# Patient Record
Sex: Female | Born: 1945 | Race: White | Hispanic: No | Smoking: Current every day smoker
Health system: Southern US, Community
[De-identification: ages and names within clinical notes are randomized; demographics above are authoritative.]

## PROBLEM LIST (undated history)

## (undated) DIAGNOSIS — J449 Chronic obstructive pulmonary disease, unspecified: Secondary | ICD-10-CM

## (undated) DIAGNOSIS — I517 Cardiomegaly: Secondary | ICD-10-CM

## (undated) DIAGNOSIS — I2510 Atherosclerotic heart disease of native coronary artery without angina pectoris: Secondary | ICD-10-CM

## (undated) DIAGNOSIS — Z22322 Carrier or suspected carrier of Methicillin resistant Staphylococcus aureus: Secondary | ICD-10-CM

## (undated) DIAGNOSIS — IMO0002 Reserved for concepts with insufficient information to code with codable children: Secondary | ICD-10-CM

## (undated) DIAGNOSIS — F1120 Opioid dependence, uncomplicated: Secondary | ICD-10-CM

## (undated) DIAGNOSIS — M1990 Unspecified osteoarthritis, unspecified site: Secondary | ICD-10-CM

## (undated) DIAGNOSIS — F32A Depression, unspecified: Secondary | ICD-10-CM

## (undated) DIAGNOSIS — F329 Major depressive disorder, single episode, unspecified: Secondary | ICD-10-CM

## (undated) DIAGNOSIS — R52 Pain, unspecified: Secondary | ICD-10-CM

## (undated) DIAGNOSIS — R569 Unspecified convulsions: Secondary | ICD-10-CM

## (undated) DIAGNOSIS — K5792 Diverticulitis of intestine, part unspecified, without perforation or abscess without bleeding: Secondary | ICD-10-CM

## (undated) DIAGNOSIS — E079 Disorder of thyroid, unspecified: Secondary | ICD-10-CM

## (undated) DIAGNOSIS — K219 Gastro-esophageal reflux disease without esophagitis: Secondary | ICD-10-CM

## (undated) DIAGNOSIS — I10 Essential (primary) hypertension: Secondary | ICD-10-CM

## (undated) DIAGNOSIS — M797 Fibromyalgia: Secondary | ICD-10-CM

## (undated) DIAGNOSIS — H269 Unspecified cataract: Secondary | ICD-10-CM

## (undated) DIAGNOSIS — G40909 Epilepsy, unspecified, not intractable, without status epilepticus: Secondary | ICD-10-CM

## (undated) DIAGNOSIS — M329 Systemic lupus erythematosus, unspecified: Secondary | ICD-10-CM

## (undated) DIAGNOSIS — E039 Hypothyroidism, unspecified: Secondary | ICD-10-CM

## (undated) DIAGNOSIS — H9190 Unspecified hearing loss, unspecified ear: Secondary | ICD-10-CM

## (undated) DIAGNOSIS — M539 Dorsopathy, unspecified: Secondary | ICD-10-CM

## (undated) DIAGNOSIS — K529 Noninfective gastroenteritis and colitis, unspecified: Secondary | ICD-10-CM

## (undated) HISTORY — PX: CORONARY ANGIOPLASTY: SHX604

## (undated) HISTORY — DX: Disorder of thyroid, unspecified: E07.9

## (undated) HISTORY — DX: Unspecified cataract: H26.9

## (undated) HISTORY — PX: CERVICAL SPINE SURGERY: SHX589

## (undated) HISTORY — DX: Opioid dependence, uncomplicated: F11.20

## (undated) HISTORY — PX: ABDOMINAL HYSTERECTOMY: SHX81

## (undated) HISTORY — DX: Essential (primary) hypertension: I10

## (undated) HISTORY — PX: BREAST SURGERY: SHX581

## (undated) HISTORY — DX: Chronic obstructive pulmonary disease, unspecified: J44.9

## (undated) HISTORY — DX: Unspecified osteoarthritis, unspecified site: M19.90

## (undated) HISTORY — DX: Diverticulitis of intestine, part unspecified, without perforation or abscess without bleeding: K57.92

## (undated) HISTORY — PX: EYE SURGERY: SHX253

## (undated) HISTORY — PX: OTHER SURGICAL HISTORY: SHX169

## (undated) HISTORY — PX: BACK SURGERY: SHX140

---

## 1998-05-22 ENCOUNTER — Ambulatory Visit (HOSPITAL_COMMUNITY): Admission: RE | Admit: 1998-05-22 | Discharge: 1998-05-22 | Payer: Self-pay | Admitting: Neurological Surgery

## 1998-05-22 ENCOUNTER — Encounter: Payer: Self-pay | Admitting: Neurological Surgery

## 1999-06-05 ENCOUNTER — Encounter: Payer: Self-pay | Admitting: Neurological Surgery

## 1999-06-05 ENCOUNTER — Ambulatory Visit (HOSPITAL_COMMUNITY): Admission: RE | Admit: 1999-06-05 | Discharge: 1999-06-05 | Payer: Self-pay | Admitting: Neurological Surgery

## 1999-06-23 ENCOUNTER — Encounter: Payer: Self-pay | Admitting: Neurological Surgery

## 1999-06-25 ENCOUNTER — Inpatient Hospital Stay (HOSPITAL_COMMUNITY): Admission: RE | Admit: 1999-06-25 | Discharge: 1999-06-26 | Payer: Self-pay | Admitting: Neurological Surgery

## 1999-06-25 ENCOUNTER — Encounter: Payer: Self-pay | Admitting: Neurological Surgery

## 1999-07-16 ENCOUNTER — Encounter: Admission: RE | Admit: 1999-07-16 | Discharge: 1999-07-16 | Payer: Self-pay | Admitting: Neurological Surgery

## 1999-07-16 ENCOUNTER — Encounter: Payer: Self-pay | Admitting: Neurological Surgery

## 1999-08-28 ENCOUNTER — Encounter: Payer: Self-pay | Admitting: Neurological Surgery

## 1999-08-28 ENCOUNTER — Encounter: Admission: RE | Admit: 1999-08-28 | Discharge: 1999-08-28 | Payer: Self-pay | Admitting: Neurological Surgery

## 1999-09-03 ENCOUNTER — Ambulatory Visit (HOSPITAL_COMMUNITY): Admission: RE | Admit: 1999-09-03 | Discharge: 1999-09-03 | Payer: Self-pay | Admitting: Neurosurgery

## 2000-01-27 ENCOUNTER — Ambulatory Visit (HOSPITAL_COMMUNITY): Admission: RE | Admit: 2000-01-27 | Discharge: 2000-01-27 | Payer: Self-pay | Admitting: Internal Medicine

## 2000-03-02 ENCOUNTER — Encounter: Payer: Self-pay | Admitting: Neurological Surgery

## 2000-03-02 ENCOUNTER — Encounter: Admission: RE | Admit: 2000-03-02 | Discharge: 2000-03-02 | Payer: Self-pay | Admitting: Neurological Surgery

## 2000-04-08 ENCOUNTER — Encounter: Payer: Self-pay | Admitting: Neurological Surgery

## 2000-04-08 ENCOUNTER — Ambulatory Visit (HOSPITAL_COMMUNITY): Admission: RE | Admit: 2000-04-08 | Discharge: 2000-04-08 | Payer: Self-pay | Admitting: Neurological Surgery

## 2000-08-19 ENCOUNTER — Encounter: Payer: Self-pay | Admitting: Neurological Surgery

## 2000-08-19 ENCOUNTER — Encounter: Admission: RE | Admit: 2000-08-19 | Discharge: 2000-08-19 | Payer: Self-pay | Admitting: Neurological Surgery

## 2001-09-13 ENCOUNTER — Ambulatory Visit (HOSPITAL_COMMUNITY): Admission: RE | Admit: 2001-09-13 | Discharge: 2001-09-13 | Payer: Self-pay | Admitting: Neurological Surgery

## 2001-09-13 ENCOUNTER — Encounter: Payer: Self-pay | Admitting: Neurological Surgery

## 2002-09-19 ENCOUNTER — Ambulatory Visit (HOSPITAL_COMMUNITY): Admission: RE | Admit: 2002-09-19 | Discharge: 2002-09-19 | Payer: Self-pay | Admitting: Neurological Surgery

## 2002-09-19 ENCOUNTER — Encounter: Payer: Self-pay | Admitting: Neurological Surgery

## 2003-04-19 ENCOUNTER — Encounter: Payer: Self-pay | Admitting: Neurology

## 2003-04-19 ENCOUNTER — Ambulatory Visit (HOSPITAL_COMMUNITY): Admission: RE | Admit: 2003-04-19 | Discharge: 2003-04-19 | Payer: Self-pay | Admitting: Neurology

## 2003-10-27 ENCOUNTER — Encounter: Admission: RE | Admit: 2003-10-27 | Discharge: 2003-10-27 | Payer: Self-pay | Admitting: Specialist

## 2003-10-31 ENCOUNTER — Encounter: Admission: RE | Admit: 2003-10-31 | Discharge: 2003-10-31 | Payer: Self-pay | Admitting: Orthopedic Surgery

## 2003-11-08 ENCOUNTER — Ambulatory Visit (HOSPITAL_BASED_OUTPATIENT_CLINIC_OR_DEPARTMENT_OTHER): Admission: RE | Admit: 2003-11-08 | Discharge: 2003-11-08 | Payer: Self-pay | Admitting: Specialist

## 2003-11-08 ENCOUNTER — Ambulatory Visit (HOSPITAL_COMMUNITY): Admission: RE | Admit: 2003-11-08 | Discharge: 2003-11-08 | Payer: Self-pay | Admitting: Specialist

## 2004-02-17 ENCOUNTER — Ambulatory Visit (HOSPITAL_COMMUNITY): Admission: RE | Admit: 2004-02-17 | Discharge: 2004-02-17 | Payer: Self-pay | Admitting: Neurological Surgery

## 2004-05-12 ENCOUNTER — Ambulatory Visit: Payer: Self-pay | Admitting: Anesthesiology

## 2004-06-11 ENCOUNTER — Ambulatory Visit: Payer: Self-pay | Admitting: Anesthesiology

## 2004-07-01 ENCOUNTER — Ambulatory Visit: Payer: Self-pay | Admitting: Anesthesiology

## 2004-08-03 ENCOUNTER — Ambulatory Visit: Payer: Self-pay | Admitting: Anesthesiology

## 2004-08-27 ENCOUNTER — Ambulatory Visit: Payer: Self-pay | Admitting: Anesthesiology

## 2004-09-23 ENCOUNTER — Ambulatory Visit: Payer: Self-pay | Admitting: Anesthesiology

## 2004-10-19 ENCOUNTER — Ambulatory Visit: Payer: Self-pay | Admitting: Internal Medicine

## 2004-10-20 ENCOUNTER — Observation Stay: Payer: Self-pay | Admitting: Anesthesiology

## 2004-11-17 ENCOUNTER — Ambulatory Visit: Payer: Self-pay | Admitting: Anesthesiology

## 2004-12-17 ENCOUNTER — Ambulatory Visit: Payer: Self-pay | Admitting: Anesthesiology

## 2005-01-06 ENCOUNTER — Ambulatory Visit: Payer: Self-pay | Admitting: Anesthesiology

## 2005-02-02 ENCOUNTER — Ambulatory Visit: Payer: Self-pay | Admitting: Anesthesiology

## 2005-03-08 ENCOUNTER — Ambulatory Visit: Payer: Self-pay | Admitting: Anesthesiology

## 2005-04-06 ENCOUNTER — Ambulatory Visit: Payer: Self-pay | Admitting: Anesthesiology

## 2005-05-10 ENCOUNTER — Ambulatory Visit: Payer: Self-pay | Admitting: Anesthesiology

## 2005-06-07 ENCOUNTER — Ambulatory Visit: Payer: Self-pay | Admitting: Anesthesiology

## 2005-06-30 ENCOUNTER — Ambulatory Visit: Payer: Self-pay | Admitting: Anesthesiology

## 2005-07-12 HISTORY — PX: JOINT REPLACEMENT: SHX530

## 2005-08-10 ENCOUNTER — Ambulatory Visit: Payer: Self-pay | Admitting: Anesthesiology

## 2005-09-09 ENCOUNTER — Ambulatory Visit: Payer: Self-pay | Admitting: Anesthesiology

## 2005-10-01 ENCOUNTER — Ambulatory Visit (HOSPITAL_COMMUNITY): Admission: RE | Admit: 2005-10-01 | Discharge: 2005-10-01 | Payer: Self-pay | Admitting: Neurological Surgery

## 2005-10-07 ENCOUNTER — Ambulatory Visit: Payer: Self-pay | Admitting: Anesthesiology

## 2005-11-02 ENCOUNTER — Ambulatory Visit: Payer: Self-pay | Admitting: Anesthesiology

## 2005-11-27 ENCOUNTER — Ambulatory Visit (HOSPITAL_COMMUNITY): Admission: RE | Admit: 2005-11-27 | Discharge: 2005-11-27 | Payer: Self-pay | Admitting: Neurological Surgery

## 2005-12-07 ENCOUNTER — Ambulatory Visit: Payer: Self-pay | Admitting: Anesthesiology

## 2006-01-04 ENCOUNTER — Ambulatory Visit: Payer: Self-pay | Admitting: Anesthesiology

## 2006-01-05 ENCOUNTER — Ambulatory Visit: Payer: Self-pay | Admitting: Pain Medicine

## 2006-01-27 ENCOUNTER — Ambulatory Visit: Payer: Self-pay | Admitting: Anesthesiology

## 2006-02-28 ENCOUNTER — Other Ambulatory Visit: Payer: Self-pay

## 2006-03-08 ENCOUNTER — Ambulatory Visit: Payer: Self-pay | Admitting: Anesthesiology

## 2006-03-10 ENCOUNTER — Inpatient Hospital Stay: Payer: Self-pay | Admitting: Specialist

## 2006-03-25 ENCOUNTER — Other Ambulatory Visit: Payer: Self-pay

## 2006-03-25 ENCOUNTER — Emergency Department: Payer: Self-pay | Admitting: Emergency Medicine

## 2006-03-26 ENCOUNTER — Emergency Department: Payer: Self-pay | Admitting: General Practice

## 2006-05-18 ENCOUNTER — Ambulatory Visit: Payer: Self-pay | Admitting: Anesthesiology

## 2006-05-31 ENCOUNTER — Inpatient Hospital Stay: Payer: Self-pay | Admitting: Unknown Physician Specialty

## 2006-06-28 ENCOUNTER — Ambulatory Visit: Payer: Self-pay | Admitting: Anesthesiology

## 2006-07-25 ENCOUNTER — Ambulatory Visit: Payer: Self-pay | Admitting: Anesthesiology

## 2006-08-24 ENCOUNTER — Ambulatory Visit: Payer: Self-pay | Admitting: Anesthesiology

## 2006-09-20 ENCOUNTER — Ambulatory Visit: Payer: Self-pay | Admitting: Anesthesiology

## 2006-10-11 ENCOUNTER — Emergency Department: Payer: Self-pay | Admitting: Emergency Medicine

## 2006-10-17 ENCOUNTER — Ambulatory Visit: Payer: Self-pay | Admitting: Anesthesiology

## 2006-11-23 ENCOUNTER — Ambulatory Visit: Payer: Self-pay | Admitting: Anesthesiology

## 2006-12-19 ENCOUNTER — Ambulatory Visit: Payer: Self-pay | Admitting: Pain Medicine

## 2007-01-16 ENCOUNTER — Ambulatory Visit: Payer: Self-pay | Admitting: Pain Medicine

## 2007-02-13 ENCOUNTER — Ambulatory Visit: Payer: Self-pay | Admitting: Pain Medicine

## 2007-02-17 ENCOUNTER — Ambulatory Visit: Payer: Self-pay | Admitting: Pain Medicine

## 2007-02-27 ENCOUNTER — Ambulatory Visit: Payer: Self-pay | Admitting: Pain Medicine

## 2007-03-14 ENCOUNTER — Ambulatory Visit: Payer: Self-pay | Admitting: Pain Medicine

## 2007-03-30 ENCOUNTER — Ambulatory Visit: Payer: Self-pay | Admitting: Physician Assistant

## 2007-05-09 ENCOUNTER — Inpatient Hospital Stay (HOSPITAL_COMMUNITY): Admission: RE | Admit: 2007-05-09 | Discharge: 2007-05-10 | Payer: Self-pay | Admitting: Neurological Surgery

## 2007-05-15 ENCOUNTER — Ambulatory Visit: Payer: Self-pay | Admitting: Physician Assistant

## 2007-06-07 ENCOUNTER — Ambulatory Visit: Payer: Self-pay | Admitting: Physician Assistant

## 2007-07-11 ENCOUNTER — Ambulatory Visit: Payer: Self-pay | Admitting: Physician Assistant

## 2007-07-21 ENCOUNTER — Ambulatory Visit (HOSPITAL_COMMUNITY): Admission: RE | Admit: 2007-07-21 | Discharge: 2007-07-21 | Payer: Self-pay | Admitting: Neurological Surgery

## 2007-08-09 ENCOUNTER — Ambulatory Visit: Payer: Self-pay | Admitting: Physician Assistant

## 2007-09-13 ENCOUNTER — Ambulatory Visit: Payer: Self-pay | Admitting: Physician Assistant

## 2007-09-21 ENCOUNTER — Ambulatory Visit: Payer: Self-pay | Admitting: Urology

## 2007-10-11 ENCOUNTER — Ambulatory Visit: Payer: Self-pay | Admitting: Physician Assistant

## 2007-11-09 ENCOUNTER — Ambulatory Visit (HOSPITAL_COMMUNITY): Admission: RE | Admit: 2007-11-09 | Discharge: 2007-11-09 | Payer: Self-pay | Admitting: Neurological Surgery

## 2007-12-07 ENCOUNTER — Ambulatory Visit: Payer: Self-pay | Admitting: Physician Assistant

## 2008-01-06 ENCOUNTER — Emergency Department: Payer: Self-pay | Admitting: Emergency Medicine

## 2008-03-07 ENCOUNTER — Ambulatory Visit: Payer: Self-pay | Admitting: Physician Assistant

## 2008-06-04 ENCOUNTER — Ambulatory Visit: Payer: Self-pay | Admitting: Physician Assistant

## 2008-09-05 ENCOUNTER — Ambulatory Visit: Payer: Self-pay | Admitting: Physician Assistant

## 2008-09-24 ENCOUNTER — Ambulatory Visit: Payer: Self-pay | Admitting: Internal Medicine

## 2008-11-06 ENCOUNTER — Ambulatory Visit: Payer: Self-pay | Admitting: Pain Medicine

## 2008-11-19 ENCOUNTER — Ambulatory Visit: Payer: Self-pay | Admitting: Pain Medicine

## 2008-12-03 ENCOUNTER — Ambulatory Visit: Payer: Self-pay | Admitting: Physician Assistant

## 2008-12-21 ENCOUNTER — Ambulatory Visit: Payer: Self-pay | Admitting: Specialist

## 2008-12-30 ENCOUNTER — Ambulatory Visit: Payer: Self-pay | Admitting: Pain Medicine

## 2009-01-07 ENCOUNTER — Ambulatory Visit: Payer: Self-pay | Admitting: Pain Medicine

## 2009-01-21 ENCOUNTER — Ambulatory Visit: Payer: Self-pay | Admitting: Pain Medicine

## 2009-01-28 ENCOUNTER — Ambulatory Visit: Payer: Self-pay | Admitting: Pain Medicine

## 2009-02-19 ENCOUNTER — Inpatient Hospital Stay: Payer: Self-pay | Admitting: Internal Medicine

## 2009-03-04 ENCOUNTER — Ambulatory Visit: Payer: Self-pay | Admitting: Physician Assistant

## 2009-04-30 ENCOUNTER — Ambulatory Visit: Payer: Self-pay | Admitting: Physician Assistant

## 2009-05-21 ENCOUNTER — Inpatient Hospital Stay: Payer: Self-pay | Admitting: Internal Medicine

## 2009-05-26 DIAGNOSIS — R569 Unspecified convulsions: Secondary | ICD-10-CM | POA: Insufficient documentation

## 2009-06-25 ENCOUNTER — Emergency Department: Payer: Self-pay | Admitting: Emergency Medicine

## 2009-07-21 ENCOUNTER — Ambulatory Visit: Payer: Self-pay | Admitting: Pain Medicine

## 2009-07-24 ENCOUNTER — Ambulatory Visit: Payer: Self-pay | Admitting: Neurology

## 2009-08-18 ENCOUNTER — Ambulatory Visit: Payer: Self-pay | Admitting: Pain Medicine

## 2009-11-11 ENCOUNTER — Ambulatory Visit: Payer: Self-pay | Admitting: Internal Medicine

## 2010-01-21 ENCOUNTER — Ambulatory Visit: Payer: Self-pay | Admitting: Pain Medicine

## 2010-08-02 ENCOUNTER — Encounter: Payer: Self-pay | Admitting: Neurological Surgery

## 2010-08-06 ENCOUNTER — Emergency Department: Payer: Self-pay | Admitting: Emergency Medicine

## 2010-11-24 NOTE — Op Note (Signed)
Diane Adkins, Diane Adkins                  ACCOUNT NO.:  [id number]   MEDICAL RECORD NO.:  [id number]          PATIENT TYPE:  INP   LOCATION:  3017                         FACILITY:  [facility]   PHYSICIAN:  Stefani Dama, M.D.  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   PROCEDURE:  Anterior cervical decompression, C7-T1, arthrodesis with  structural allograft Alphatec plate fixation.   SURGEON:  Stefani Dama, M.D.   ASSISTANT:  Payton Doughty, M.D.   ANESTHESIA:  General endotracheal.   INDICATIONS:  Diane Adkins is a 65 year old individual who has had  significant neck, shoulder, and arm pain with pain in the C8  distribution, particularly on the right side.  She has weakness in the  intrinsic muscles also.  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  She was advised  regarding surgical decompression and arthrodesis via an anterior  approach.   PROCEDURE:  The patient was brought to the operating room supine on the  stretcher.  After smooth induction of general endotracheal anesthesia,  she was placed in 5 pounds of halter traction, and the neck was prepped  with alcohol and DuraPrep and draped in a sterile fashion.   The previously made vertical incision was reopened, and the dissection  was taken down through the platysma, and the plane between the  sternocleidomastoid and the strap muscles was dissected with Metzenbaum  scissors.  As the prevertebral space was reached, the area was explored  and the disk space at C7-T1 was identified.  The soft tissues were  cleared from this using blunt dissection, and then  the disk space was  opened with a #15 blade.  A combination of some curettes and rongeurs  were used to evacuate the disk space and clear out down to the posterior  longitudinal ligament.  This space was noted be notably collapsed, and  the self-retaining retractor was placed into the disk space.  Dissection  was then carried out on the left side and the right side to expose the  exiting nerve roots.  On the right side, there was noted be substantial  subligamentous disk material.  This was taken up with a 2-mm Kerrison  punch.  A high-speed bur was used to then drill off some uncinate  process hypertrophy in this region, and this allowed opening of the  foramen to expose and decompress the C8 nerve root completely.  A  similar procedure was carried out on the left side.  There was no  significant disk material that was herniated in this region.  A  spondylophyte was encountered from the uncinate process spur.  This was  drilled down.  Hemostasis was achieved in the soft tissues, and then the  interspace was sized for an 8-mm trans graft.  This was sized and shaped  with a 4-mm barrel bit, and then it was filled with demineralized bone  matrix and placed into the interspace and countersunk slightly.  A  standard size Trestle 16-mm plate was fixed to the ventral aspect of the  vertebral bodies with 16-mm variable angle screws.  This system was  locked into position.  The wound was irrigated copiously with antibiotic  irrigating  solution.  When hemostasis was obtained, the platysma was closed with 3-  0 Vicryl, and 3-0 Vicryl was used in the subcuticular tissue.  A dry  sterile dressing was placed on the skin.   The patient tolerated the procedure well and was returned to the  recovery room in stable condition      Stefani Dama, M.D.  Electronically Signed     HJE/MEDQ  D:  05/09/2007  T:  05/09/2007  Job:  [id number]

## 2010-11-27 NOTE — H&P (Signed)
[facility]  Patient:    Diane Adkins                         MRN: [id number] Adm. 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  HISTORY OF PRESENT ILLNESS:  The patient is a 65 year old individual who has had previous cervical anterior diskectomy and arthrodesis at C5-6 and C6-7. Her initial surgery was in 1989. She had a pseudoarthrosis; and subsequently, I revised her  surgery in 1990. She has had continued difficulties with neck pain and shoulder  pain. She has been advised regarding further decompression and arthrodesis secondary to progressive spondylosis at C4-5 with early evidence of cord compression at that level. The patient has a significant history of chronic upper extremity and lower extremity pain. She also has a history of a fibromyalgia-type syndrome. She was last in the office June 10, 1999. At that time, her films  were reviewed. There has been some concern that she may have a pseudoarthrosis t the C5-6 level, and I advised at the time of surgery this level will be explored to make sure that the arthrodesis is stable and intact. The patient has noted significant proximal shoulder and arm pain. She complains of diffuse numbness and tingling into the distal upper extremities.  PAST MEDICAL HISTORY:  Reveals that her whole course has been complicated by previous neck surgery as indicated in 1989, then again in 1990. She has had breast implant surgery. She has had benign breast tumor resected in 1983. She was diagnosed with fibromyalgia syndrome in 1991. She has had inflammatory bowel disease and is also hypothyroid.  CURRENT MEDICATIONS:  Prilosec, Carafate, estrogen, Synthroid 0.125 mg a day, Claritin, Paxil, Flexeril, Nitrostat, meclozine, Levsin, loperamide, and diazepam.  ALLERGIES:  Include KEFLEX,  SULFA, and MACRODANTIN.  SOCIAL HISTORY:  Reveals the patient is married. She has been disabled since difficulties with the cervical spondylosis in the early 90s.  FAMILY HISTORY:  Negative for any significant medical problems.  PHYSICAL EXAMINATION:  GENERAL:  She is an alert, oriented individual in no overt distress while sitting comfortably. When asked to stand and walk about, she does so with some pain in er low back; and she moves about very slowly. Range of motion of her neck reveals hat she can turn 60 degrees to the right and 60 degrees to the left with some difficulty. She extends 10 degrees and flexes 20 degrees. Axial compression causes her to complain of significant neck pain. Motor strength in the upper extremities reveal the deltoids, biceps, triceps, grips, and intrinsics have good strength,  tone, and bulk to confrontation though there is give-way weakness in the deltoids at 4/5. Sensation is intact to pin and light touch in the distal upper extremities. Reflexes are absent in the biceps and triceps, 1+ in the brachioradialis, 3+ in the patellae, 3+ in the Achilles. Babinskis are equivocal bilaterally. Cranial nerves examination reveals the pupils are 3 mm, briskly reactive to light and accommodation. The extraocular movements are full. The face is symmetric to grimace. Tongue and uvula are in the midline. Sclerae and conjunctivae are clear.  NECK:  No masses are palpable. There is a well-healed scar on the left side of he neck in the vertical  orientation along the anterior border of the sternocleidomastoid.  LUNGS:  Clear to auscultation.  HEART:  Regular rate and rhythm. No murmurs are heard.  ABDOMEN:  Soft. Bowel sounds are positive. No masses are palpable.  EXTREMITIES:  No clubbing, cyanosis, or edema.  BACK:  Demonstrates a significant and thickened scar in the midportion of the back where she has had previous lumbar surgery with Ray cage  placement. This operation was complicated by a postoperative wound infection.  IMPRESSION:  The patient has evidence of cervical spondylosis with myelopathy at C4-5. She is to undergo anterior diskectomy and arthrodesis at that level. C5-6  will also be explored at the time of surgery. DD:  06/25/99 TD:  06/25/99 Job: [id number]

## 2010-11-27 NOTE — Op Note (Signed)
NAME:  Diane Adkins                           ACCOUNT NO.:  [id number]   MEDICAL RECORD NO.:  [id number]                   PATIENT TYPE:  AMB   LOCATION:  DSC                                  FACILITY:  [facility]   PHYSICIAN:  Erasmo Leventhal, M.D.         DATE OF BIRTH:  14-Jan-1946   DATE OF PROCEDURE:  11/08/2003  DATE OF DISCHARGE:                                 OPERATIVE REPORT   PREOPERATIVE DIAGNOSIS:  Right shoulder rotator cuff tear with symptomatic  degenerative AC joint.   POSTOPERATIVE DIAGNOSIS:  Right shoulder large retracted rotator cuff tear  involving supraspinatus and infraspinatus, chronic impingement, cuff  tendinopathy, symptomatic degenerative AC joint.   PROCEDURE:  Right shoulder examination under anesthesia, open subacromial  decompression, rotator cuff repair, open distal clavicle resection Mumford  procedure.   SURGEON:  Erasmo Leventhal, M.D.   ASSISTANT:  Jaquelyn Bitter. Chabon, P.A.C.   ANESTHESIA:  Preoperative scalene block and general.   ESTIMATED BLOOD LOSS:  Less than 20 mL.   DRAINS:  None.   COMPLICATIONS:  None.   DISPOSITION:  To PACU stable.   INDICATIONS FOR PROCEDURE:  The patient and family counseled in the holding  area.  The correct side was identified.  IV was started and IV antibiotics  were given.  Chart was signed and reviewed appropriately.   DESCRIPTION OF PROCEDURE:  She was then taken to the operating room and  placed in the supine position with general anesthesia, placed in the  modified beach chair position.  The right shoulder was examined and we had a  full functional range of motion with slight decrease in abduction and  forward flexion.  She was then prepped with Duraprep and draped in the usual  sterile fashion.  Overlined the surgical incision outlined with a marking  pen and anesthetized with 5 mL of 1% lidocaine with epinephrine.  Oblique  incision was made at the base of the [facility] joint and the acromion  through the  skin and subcutaneous tissue.  Skin flaps were developed.  The distal  clavicle was identified.  Circumferential dissection was taken with  electrocautery.  Baby Bennett retractors were placed and 1 to 1.5 cm of  clavicle was removed circumferentially.  Bone wax was placed on this.  The  clavicle was palpated and found to be stable.  The deltoid was gently  released off the anterior acromion and split for a total of 3.5 cm and a tag  suture was placed to protect the axillary nerve.  Axillary nerve was sought  out and protected throughout the entire case.  The anterior acromion was  then resected and inferior acromioplasty was performed converting it to a  type I acromion morphology.  CA ligament was not resected and was allowed to  heal back and repaired later.  Rotator cuff was then identified.  She had a  marked bursitis and bursectomy was performed.  Rotator  cuff was found to  have a large retracted tear of the supraspinatus and infraspinatus with  marked rotator cuff tendinopathy.  At this point in time utilizing sharp and  blunt dissection, the rotator cuff was mobilized as well as possible both  intra and extra-articular.  #2 Fiberwire was utilized to close the rotator  cuff interval anteriorly between the subscapular and supraspinatus.  Biceps  tendon was intact.  The joint was inspected and no significant abnormalities  were noted in the joint at this time.  The wounds were copiously irrigated.  The ___________ with a curet down to bleeding bone.  Two Arthrex  bioabsorbable anchors were placed and the rotator cuff was repaired back to  its anatomic insertion as well as possible.  I will again note there was  quite a bit of rotator cuff tendinopathy.  However, I did not feel like it  needed a Restore patch at this time.  Drill holes were made through the  greater tuberosity.  Sutures were then taken from the repair through the  greater tuberosity and tied them to the  side giving them a nice lateral  closure of the rotator cuff.  This was put through a general range of  motion, suture line remained stable.  The wounds were copiously irrigated.   At this time, a meticulous closure of the deltoid back to the acromion was  done with #2 Ethibond suture.  The deltoid trapezius fascia was repaired  with Vicryl and the lateral of the deltoid fascia with Vicryl.  This gave a  nice sturdy repair and reattachment of the deltoid anatomically.  I also  allowed the CA ligament to go back to the anterior acromion so it could heal  back for support of the rotator cuff.  The wounds were copiously irrigated  again.  The subcu closed with Vicryl, skin closed with subcuticular Monocryl  sutures.  For anesthesia, 10 mL of 0.25% Marcaine with epinephrine was  placed in the skin wedges.  Benzoin and Steri-Strips were applied and a  sterile compressive dressing.  She tolerated the procedure well and there  were no complications.  She was gently awakened and she was taken from the  operating room to PACU in stable condition.  Needle, sponge, and instrument  counts correct.  No complications.   Decreased surgical time helped throughout this entire procedure Jaquelyn Bitter.  Chabon, P.A. assistance was needed.                                               Erasmo Leventhal, M.D.    RAC/MEDQ  D:  11/08/2003  T:  11/09/2003  Job:  [id number]

## 2011-01-26 ENCOUNTER — Emergency Department: Payer: Self-pay | Admitting: Emergency Medicine

## 2011-02-01 ENCOUNTER — Emergency Department: Payer: Self-pay | Admitting: Emergency Medicine

## 2011-04-01 LAB — CREATININE, SERUM
Creatinine, Ser: 0.78
GFR calc Af Amer: >60

## 2011-04-21 LAB — BASIC METABOLIC PANEL
BUN: 11
Chloride: 101
Creatinine, Ser: 0.52
GFR calc non Af Amer: >60
Glucose, Bld: 101 — ABNORMAL HIGH
Potassium: 4.7

## 2011-04-21 LAB — TYPE AND SCREEN: Antibody Screen: NEGATIVE

## 2011-04-21 LAB — CBC
Platelets: 349
RDW: 12.9
WBC: 7.7

## 2011-07-13 HISTORY — PX: OTHER SURGICAL HISTORY: SHX169

## 2013-05-23 ENCOUNTER — Ambulatory Visit: Payer: Self-pay | Admitting: Internal Medicine

## 2013-10-17 DIAGNOSIS — K219 Gastro-esophageal reflux disease without esophagitis: Secondary | ICD-10-CM | POA: Insufficient documentation

## 2013-10-17 DIAGNOSIS — I2510 Atherosclerotic heart disease of native coronary artery without angina pectoris: Secondary | ICD-10-CM | POA: Insufficient documentation

## 2013-10-17 DIAGNOSIS — I10 Essential (primary) hypertension: Secondary | ICD-10-CM | POA: Insufficient documentation

## 2013-11-15 DIAGNOSIS — G5603 Carpal tunnel syndrome, bilateral upper limbs: Secondary | ICD-10-CM | POA: Insufficient documentation

## 2013-12-20 ENCOUNTER — Other Ambulatory Visit (HOSPITAL_COMMUNITY): Payer: Self-pay | Admitting: *Deleted

## 2013-12-20 DIAGNOSIS — R42 Dizziness and giddiness: Secondary | ICD-10-CM

## 2014-01-16 DIAGNOSIS — R1310 Dysphagia, unspecified: Secondary | ICD-10-CM | POA: Insufficient documentation

## 2014-04-01 ENCOUNTER — Ambulatory Visit: Payer: Self-pay | Admitting: Unknown Physician Specialty

## 2014-04-04 LAB — PATHOLOGY REPORT

## 2014-09-19 ENCOUNTER — Encounter: Payer: Self-pay | Admitting: *Deleted

## 2014-09-19 DIAGNOSIS — R0989 Other specified symptoms and signs involving the circulatory and respiratory systems: Secondary | ICD-10-CM

## 2014-12-12 ENCOUNTER — Other Ambulatory Visit: Payer: Self-pay

## 2014-12-17 ENCOUNTER — Encounter: Admission: RE | Payer: Self-pay | Source: Ambulatory Visit

## 2014-12-17 ENCOUNTER — Ambulatory Visit: Admission: RE | Admit: 2014-12-17 | Payer: Self-pay | Source: Ambulatory Visit | Admitting: Ophthalmology

## 2014-12-17 SURGERY — PHACOEMULSIFICATION, CATARACT, WITH IOL INSERTION
Anesthesia: Choice | Laterality: Left

## 2015-02-28 ENCOUNTER — Encounter: Payer: Self-pay | Admitting: *Deleted

## 2015-02-28 ENCOUNTER — Other Ambulatory Visit: Payer: Self-pay

## 2015-02-28 ENCOUNTER — Emergency Department
Admission: EM | Admit: 2015-02-28 | Discharge: 2015-02-28 | Disposition: A | Discharge status: Home / Self Care | Payer: Medicare Other | Attending: Emergency Medicine | Admitting: Emergency Medicine

## 2015-02-28 DIAGNOSIS — G8929 Other chronic pain: Secondary | ICD-10-CM | POA: Diagnosis not present

## 2015-02-28 DIAGNOSIS — F1123 Opioid dependence with withdrawal: Secondary | ICD-10-CM

## 2015-02-28 DIAGNOSIS — M549 Dorsalgia, unspecified: Secondary | ICD-10-CM | POA: Diagnosis not present

## 2015-02-28 DIAGNOSIS — R1084 Generalized abdominal pain: Secondary | ICD-10-CM | POA: Diagnosis not present

## 2015-02-28 DIAGNOSIS — Z7902 Long term (current) use of antithrombotics/antiplatelets: Secondary | ICD-10-CM | POA: Insufficient documentation

## 2015-02-28 DIAGNOSIS — Z720 Tobacco use: Secondary | ICD-10-CM | POA: Diagnosis not present

## 2015-02-28 DIAGNOSIS — F1193 Opioid use, unspecified with withdrawal: Secondary | ICD-10-CM

## 2015-02-28 DIAGNOSIS — R197 Diarrhea, unspecified: Secondary | ICD-10-CM | POA: Insufficient documentation

## 2015-02-28 DIAGNOSIS — Z79899 Other long term (current) drug therapy: Secondary | ICD-10-CM | POA: Diagnosis not present

## 2015-02-28 DIAGNOSIS — R112 Nausea with vomiting, unspecified: Secondary | ICD-10-CM | POA: Insufficient documentation

## 2015-02-28 DIAGNOSIS — R52 Pain, unspecified: Secondary | ICD-10-CM

## 2015-02-28 HISTORY — DX: Fibromyalgia: M79.7

## 2015-02-28 HISTORY — DX: Opioid dependence, uncomplicated: F11.20

## 2015-02-28 HISTORY — DX: Epilepsy, unspecified, not intractable, without status epilepticus: G40.909

## 2015-02-28 HISTORY — DX: Noninfective gastroenteritis and colitis, unspecified: K52.9

## 2015-02-28 LAB — CBC
HEMATOCRIT: 46.7 % (ref 35.0–47.0)
Hemoglobin: 15.9 g/dL (ref 12.0–16.0)
MCH: 29.7 pg (ref 26.0–34.0)
MCHC: 34.1 g/dL (ref 32.0–36.0)
MCV: 87.2 fL (ref 80.0–100.0)
Platelets: 323 10*3/uL (ref 150–440)
RBC: 5.36 MIL/uL — ABNORMAL HIGH (ref 3.80–5.20)
RDW: 12.6 % (ref 11.5–14.5)
WBC: 9.7 10*3/uL (ref 3.6–11.0)

## 2015-02-28 LAB — URINALYSIS COMPLETE WITH MICROSCOPIC (ARMC ONLY)
Bilirubin Urine: NEGATIVE
GLUCOSE, UA: NEGATIVE mg/dL
LEUKOCYTES UA: NEGATIVE
Nitrite: NEGATIVE
Protein, ur: 100 mg/dL — AB
Specific Gravity, Urine: 1.016 (ref 1.005–1.030)
pH: 5 (ref 5.0–8.0)

## 2015-02-28 LAB — LIPASE, BLOOD: LIPASE: 28 U/L (ref 22–51)

## 2015-02-28 LAB — TROPONIN I

## 2015-02-28 LAB — COMPREHENSIVE METABOLIC PANEL
ALBUMIN: 4.1 g/dL (ref 3.5–5.0)
ALT: 10 U/L — ABNORMAL LOW (ref 14–54)
ANION GAP: 14 (ref 5–15)
AST: 22 U/L (ref 15–41)
Alkaline Phosphatase: 95 U/L (ref 38–126)
BILIRUBIN TOTAL: 0.9 mg/dL (ref 0.3–1.2)
BUN: 18 mg/dL (ref 6–20)
CHLORIDE: 104 mmol/L (ref 101–111)
CO2: 24 mmol/L (ref 22–32)
Calcium: 9.4 mg/dL (ref 8.9–10.3)
Creatinine, Ser: 0.8 mg/dL (ref 0.44–1.00)
GFR calc Af Amer: >60 mL/min (ref >60)
GFR calc non Af Amer: >60 mL/min (ref >60)
GLUCOSE: 159 mg/dL — AB (ref 65–99)
POTASSIUM: 3.9 mmol/L (ref 3.5–5.1)
SODIUM: 142 mmol/L (ref 135–145)
TOTAL PROTEIN: 8.2 g/dL — AB (ref 6.5–8.1)

## 2015-02-28 MED ORDER — ONDANSETRON HCL 4 MG/2ML IJ SOLN
4.0000 mg | Freq: Once | INTRAMUSCULAR | Status: AC | PRN
  Administered 2015-02-28: 4 mg via INTRAVENOUS
  Filled 2015-02-28: qty 2

## 2015-02-28 MED ORDER — HYDROMORPHONE HCL 1 MG/ML IJ SOLN
1.0000 mg | INTRAMUSCULAR | Status: AC
  Administered 2015-02-28: 1 mg via INTRAVENOUS
  Filled 2015-02-28: qty 1

## 2015-02-28 MED ORDER — PROMETHAZINE HCL 25 MG RE SUPP: 25.0000 mg | Freq: Four times a day (QID) | RECTAL | Status: DC | PRN

## 2015-02-28 MED ORDER — ONDANSETRON 4 MG PO TBDP: ORAL_TABLET | ORAL | Status: DC

## 2015-02-28 MED ORDER — PROMETHAZINE HCL 25 MG/ML IJ SOLN
12.5000 mg | Freq: Once | INTRAMUSCULAR | Status: AC
  Administered 2015-02-28: 12.5 mg via INTRAVENOUS
  Filled 2015-02-28: qty 1

## 2015-02-28 MED ORDER — SODIUM CHLORIDE 0.9 % IV BOLUS (SEPSIS)
1000.0000 mL | INTRAVENOUS | Status: AC
  Administered 2015-02-28: 1000 mL via INTRAVENOUS

## 2015-02-28 NOTE — Discharge Instructions (Signed)
As we discussed, we believe that all of your symptoms are a result of withdrawal from narcotics.  Hopefully the dose of IV medication we gave you here in the emergency department will help you get through the weekend.  We discussed extensively why I cannot provide a prescription for narcotics as this will violate your agreement with the pain clinic.  Please take the prescribed nausea medications as needed and try to drink plenty of fluids to stay hydrated such as water or Gatorade.  Follow up with your pain doctor at the next available opportunity.  Return to the emergency department if he develop new or worsening symptoms that concern you.   Opioid Withdrawal Opioids are a group of narcotic drugs. They include the street drug heroin. They also include pain medicines, such as morphine, hydrocodone, oxycodone, and fentanyl. Opioid withdrawal is a group of characteristic physical and mental signs and symptoms. It typically occurs if you have been using opioids daily for several weeks or longer and stop using or rapidly decrease use. Opioid withdrawal can also occur if you have used opioids daily for a long time and are given a medicine to block the effect.  SIGNS AND SYMPTOMS Opioid withdrawal includes three or more of the following symptoms:   Depressed, anxious, or irritable mood.  Nausea or vomiting.  Muscle aches or spasms.   Watery eyes.   Runny nose.  Dilated pupils, sweating, or hairs standing on end.  Diarrhea or intestinal cramping.  Yawning.   Fever.  Increased blood pressure.  Fast pulse.  Restlessness or trouble sleeping. These signs and symptoms occur within several hours of stopping or reducing short-acting opioids, such as heroin. They can occur within 3 days of stopping or reducing long-acting opioids, such as methadone. Withdrawal begins within minutes of receiving a drug that blocks the effects of opioids, such as naltrexone or naloxone. DIAGNOSIS  Opioid use  disorder is diagnosed by your health care provider. You will be asked about your symptoms, drug and alcohol use, medical history, and use of medicines. A physical exam may be done. Lab tests may be ordered. Your health care provider may have you see a mental health professional.  TREATMENT  The treatment for opioid withdrawal is usually provided by medical doctors with special training in substance use disorders (addiction specialists). The following medicines may be included in treatment:  Opioids given in place of the abused opioid. They turn on opioid receptors in the brain and lessen or prevent withdrawal symptoms. They are gradually decreased (opioid substitution and taper).  Non-opioids that can lessen certain opioid withdrawal symptoms. They may be used alone or with opioid substitution and taper. Successful long-term recovery usually requires medicine, counseling, and group support. HOME CARE INSTRUCTIONS   Take medicines only as directed by your health care provider.  Check with your health care provider before starting new medicines.  Keep all follow-up visits as directed by your health care provider. SEEK MEDICAL CARE IF:  You are not able to take your medicines as directed.  Your symptoms get worse.  You relapse. SEEK IMMEDIATE MEDICAL CARE IF:  You have serious thoughts about hurting yourself or others.  You have a seizure.  You lose consciousness. Document Released: 07/01/2003 Document Revised: 11/12/2013 Document Reviewed: 07/11/2013 [facility] Patient Information 2015 [place], [place]. This information is not intended to replace advice given to you by your health care provider. Make sure you discuss any questions you have with your health care provider.  Nausea and Vomiting Nausea  is a sick feeling that often comes before throwing up (vomiting). Vomiting is a reflex where stomach contents come out of your mouth. Vomiting can cause severe loss of body fluids  (dehydration). Children and elderly adults can become dehydrated quickly, especially if they also have diarrhea. Nausea and vomiting are symptoms of a condition or disease. It is important to find the cause of your symptoms. CAUSES   Direct irritation of the stomach lining. This irritation can result from increased acid production (gastroesophageal reflux disease), infection, food poisoning, taking certain medicines (such as nonsteroidal anti-inflammatory drugs), alcohol use, or tobacco use.  Signals from the brain.These signals could be caused by a headache, heat exposure, an inner ear disturbance, increased pressure in the brain from injury, infection, a tumor, or a concussion, pain, emotional stimulus, or metabolic problems.  An obstruction in the gastrointestinal tract (bowel obstruction).  Illnesses such as diabetes, hepatitis, gallbladder problems, appendicitis, kidney problems, cancer, sepsis, atypical symptoms of a heart attack, or eating disorders.  Medical treatments such as chemotherapy and radiation.  Receiving medicine that makes you sleep (general anesthetic) during surgery. DIAGNOSIS Your caregiver may ask for tests to be done if the problems do not improve after a few days. Tests may also be done if symptoms are severe or if the reason for the nausea and vomiting is not clear. Tests may include:  Urine tests.  Blood tests.  Stool tests.  Cultures (to look for evidence of infection).  X-rays or other imaging studies. Test results can help your caregiver make decisions about treatment or the need for additional tests. TREATMENT You need to stay well hydrated. Drink frequently but in small amounts.You may wish to drink water, sports drinks, clear broth, or eat frozen ice pops or gelatin dessert to help stay hydrated.When you eat, eating slowly may help prevent nausea.There are also some antinausea medicines that may help prevent nausea. HOME CARE INSTRUCTIONS   Take  all medicine as directed by your caregiver.  If you do not have an appetite, do not force yourself to eat. However, you must continue to drink fluids.  If you have an appetite, eat a normal diet unless your caregiver tells you differently.  Eat a variety of complex carbohydrates (rice, wheat, potatoes, bread), lean meats, yogurt, fruits, and vegetables.  Avoid high-fat foods because they are more difficult to digest.  Drink enough water and fluids to keep your urine clear or pale yellow.  If you are dehydrated, ask your caregiver for specific rehydration instructions. Signs of dehydration may include:  Severe thirst.  Dry lips and mouth.  Dizziness.  Dark urine.  Decreasing urine frequency and amount.  Confusion.  Rapid breathing or pulse. SEEK IMMEDIATE MEDICAL CARE IF:   You have blood or brown flecks (like coffee grounds) in your vomit.  You have black or bloody stools.  You have a severe headache or stiff neck.  You are confused.  You have severe abdominal pain.  You have chest pain or trouble breathing.  You do not urinate at least once every 8 hours.  You develop cold or clammy skin.  You continue to vomit for longer than 24 to 48 hours.  You have a fever. MAKE SURE YOU:   Understand these instructions.  Will watch your condition.  Will get help right away if you are not doing well or get worse. Document Released: 06/28/2005 Document Revised: 09/20/2011 Document Reviewed: 11/25/2010 [facility] Patient Information 2015 [place], [place]. This information is not intended to replace advice  given to you by your health care provider. Make sure you discuss any questions you have with your health care provider.

## 2015-02-28 NOTE — ED Notes (Signed)
In & out catheterization performed; pt asked [place] to drain bladder, which we did. 200cc additional urine voided.

## 2015-02-28 NOTE — ED Provider Notes (Signed)
[facility] Emergency Department Provider Note  ____________________________________________  Time seen: Approximately 7:11 AM  I have reviewed the triage vital signs and the nursing notes.   HISTORY  Chief Complaint Nausea; Emesis; and Diarrhea    HPI Diane Adkins is a 69 y.o. female with an extensive history of chronic pain, prior neck surgery, who is managed by Dr. Norris Cross, who presents approximately 24 hours after onset of nausea, vomiting, diarrhea, and generalized pain throughout her body.  She and her friend to helps care for her are reporting that the patient misplaced her prescription for her chronic pain medicines, OxyContin 40 mg tablets with a quantity of 90 tablets.  She was supposed to refill this prescription several days ago but could not find the prescription.  They contacted Dr. Humphrey Rolls whoinformed them that they need a police report.  They did get a police report but now reportedly Dr. Humphrey Rolls is not in the office and they will not be able to get a replacement prescription for some time.  The patient is vomiting and having diarrhea.  She states that she cannot keep anything down including any of her regular medications which include medicine for her epilepsy.  She hurts all over including in her chest, abdomen, and throughout her extremities.  She denies fever/chills.  She denies dysuria.   Past Medical History  Diagnosis Date  . Epilepsy   . Fibromyalgia   . Opioid dependence   . Inflammatory bowel disease     There are no active problems to display for this patient.   Past Surgical History  Procedure Laterality Date  . Cervical spine surgery      Current Outpatient Rx  Name  Route  Sig  Dispense  Refill  . ALPRAZolam (XANAX) 0.25 MG tablet   Oral   Take 0.25 mg by mouth 2 (two) times daily.         . Armodafinil (NUVIGIL) 250 MG tablet   Oral   Take 250 mg by mouth daily.         . clopidogrel (PLAVIX) 75 MG tablet    Oral   Take 75 mg by mouth daily.         Marland Kitchen gabapentin (NEURONTIN) 400 MG capsule   Oral   Take 400 mg by mouth 3 (three) times daily.         Marland Kitchen levETIRAcetam (KEPPRA) 1000 MG tablet   Oral   Take 1,000 mg by mouth 2 (two) times daily.         Marland Kitchen levothyroxine (SYNTHROID, LEVOTHROID) 100 MCG tablet   Oral   Take 100 mcg by mouth daily before breakfast.         . metoprolol tartrate (LOPRESSOR) 25 MG tablet   Oral   Take 25 mg by mouth 2 (two) times daily.         . OxyCODONE (OXYCONTIN) 40 mg T12A 12 hr tablet   Oral   Take 40 mg by mouth every 8 (eight) hours.         Marland Kitchen PARoxetine (PAXIL) 20 MG tablet   Oral   Take 20 mg by mouth daily.         . ranolazine (RANEXA) 500 MG 12 hr tablet   Oral   Take 500 mg by mouth 2 (two) times daily.         . ondansetron (ZOFRAN ODT) 4 MG disintegrating tablet      Allow 1-2 tablets to dissolve in your  mouth every 6 hours as needed for nausea/vomiting   30 tablet   0   . promethazine (PHENERGAN) 25 MG suppository   Rectal   Place 1 suppository (25 mg total) rectally every 6 (six) hours as needed for nausea.   12 suppository   1     Allergies Keflex; Lamotrigine; Macrodantin; Nitrofurantoin; and Statins  History reviewed. No pertinent family history.  Social History Social History  Substance Use Topics  . Smoking status: Current Every Day Smoker -- 0.50 packs/day  . Smokeless tobacco: None  . Alcohol Use: No    Review of Systems Constitutional: No fever/chills.  Feels "terrible" Eyes: No visual changes. ENT: No sore throat. Cardiovascular: Pain throughout chest  Respiratory: Denies shortness of breath. Gastrointestinal: Abdominal pain, nausea, vomiting, and diarrhea Genitourinary: Negative for dysuria. Musculoskeletal: Pain throughout back Skin: Negative for rash. Neurological: Negative for headaches, focal weakness or numbness.  10-point ROS otherwise  negative.  ____________________________________________   PHYSICAL EXAM:  VITAL SIGNS: ED Triage Vitals  Enc Vitals Group     BP 02/28/15 0534 176/78 mmHg     Pulse Rate 02/28/15 0534 62     Resp 02/28/15 0534 16     Temp 02/28/15 0534 98 F (36.7 C)     Temp Source 02/28/15 0534 Oral     SpO2 02/28/15 0534 100 %     Weight 02/28/15 0534 100 lb (45.36 kg)     Height 02/28/15 0534 5\' 5"  (1.651 m)     Head Cir --      Peak Flow --      Pain Score 02/28/15 0535 10     Pain Loc --      Pain Edu? --      Excl. in [place]? --     Constitutional: Alert and oriented but appears in mild to moderate distress, holding an emesis bag but not producing any emesis Eyes: Conjunctivae are normal. PERRL. EOMI. Head: Atraumatic. Nose: No congestion/rhinnorhea. Mouth/Throat: Mucous membranes are moist.  Oropharynx non-erythematous. Neck: No stridor.   Cardiovascular: Normal rate, regular rhythm. Grossly normal heart sounds.  Good peripheral circulation. Respiratory: Normal respiratory effort.  No retractions. Lungs CTAB. Gastrointestinal: Soft with mild generalized tenderness but no focal tenderness.  No distention. No abdominal bruits. No CVA tenderness. Musculoskeletal: No lower extremity tenderness nor edema.  No joint effusions. Neurologic:  Normal speech and language. No gross focal neurologic deficits are appreciated.  Skin:  Skin is warm, dry and intact. No rash noted.   ____________________________________________   LABS (all labs ordered are listed, but only abnormal results are displayed)  Labs Reviewed  COMPREHENSIVE METABOLIC PANEL - Abnormal; Notable for the following:    Glucose, Bld 159 (*)    Total Protein 8.2 (*)    ALT 10 (*)    All other components within normal limits  CBC - Abnormal; Notable for the following:    RBC 5.36 (*)    All other components within normal limits  URINALYSIS COMPLETEWITH MICROSCOPIC ([facility] ONLY) - Abnormal; Notable for the following:     Color, Urine YELLOW (*)    APPearance CLEAR (*)    Ketones, ur 1+ (*)    Hgb urine dipstick 2+ (*)    Protein, ur 100 (*)    Bacteria, UA RARE (*)    Squamous Epithelial / LPF 0-5 (*)    All other components within normal limits  LIPASE, BLOOD  TROPONIN I   ____________________________________________  EKG  ED ECG REPORT I, Gustie Bobb,  Aiyanah Kalama, the attending physician, personally viewed and interpreted this ECG.   Date: 02/28/2015  EKG Time: 05:49  Rate: 62  Rhythm: normal sinus rhythm  Axis: Normal  Intervals:Short PR interval  ST&T Change: Non-specific ST segment / T-wave changes, but no evidence of acute ischemia.   ____________________________________________  RADIOLOGY I, Wolf Boulay, personally viewed and evaluated these images (plain radiographs) as part of my medical decision making.   Not indicated  ____________________________________________   PROCEDURES  Procedure(s) performed: None  Critical Care performed: No ____________________________________________   INITIAL IMPRESSION / ASSESSMENT AND PLAN / ED COURSE  Pertinent labs & imaging results that were available during my care of the patient were reviewed by me and considered in my medical decision making (see chart for details).  The patient's history, exam, and signs/symptoms are consistent with acute opioid withdrawal.  Her vital signs are stable.  I will provide a 1 L bolus of normal saline and Phenergan 12.5 mg IV and attempted to contact her primary care doctor's office to come up with the plan.  I explained to the patient and her caregiver about the [facility] chronic pain policy and why I cannot replace her prescriptions and why must avoid treating her at this time, at least until I know what her follow-up plan is.  ----------------------------------------- 9:11 AM on 02/28/2015 -----------------------------------------  The ED staff and I have been trying to track down more information about  this situation.  Her local pain clinic is closed today so I have no one to discuss the case with.  We verified with [facility] police department that the information given about the police report does seem accurate but they cannot verify the specifics of that at this time.  The patient continues to vomit and have generalized pain and complaints after 2 doses of IV antibiotics.  I had another conversation with her by explained that if I provide a prescription she can get kicked out of the pain clinic and she understands this.  She also is concerned that receiving IV narcotics may have the same effect, but I ask her what she would like me to do in that case.  She now is complaining of a possible kidney infection and stating that she is seeing blood in her urine though she has not been able to urinate here.  They also stated that there is a pain clinic office in [place] that is open today.  We are going to attempt to contact this office and I/O cath for urine.  ----------------------------------------- 10:51 AM on 02/28/2015 -----------------------------------------  The patient's workup is unremarkable.  I decided to give her a dose of Dilaudid 1 mg IV in attempt to help alleviate some of her symptoms.  Since she got the Phenergan she has not had any more vomiting.  She continues to complain of generalized pain as well as some epigastric pain which is likely secondary to the numerous episodes of emesis.  Her vital signs are normal and her blood pressure came down significantly after the Dilaudid.  I had extensive conversations with her and her friend on 2-3 occasions.  I still do not understand why when they knew the prescription was missing 4 days ago that they did not pursue seeing the doctor until today, when he is out of the office.  On our most recent conversation they explained that he was in the office 2 days ago but that she was too sick to go by car.  In an attempt to help her in  the short-term I  offered to write a small prescription to get her through the weekend of pain medication, but she refused it and accurately points out that this would violate her contract at the pain clinic.  Given her normal vital signs, reassuring lab work, and the nature of her symptoms, she does not meet inpatient criteria and needs to follow-up at the next available opportunity.  I gave the patient and her friend my usual and customary return precautions.  The patient understands, padded me on the hand and thanked me for my assistance, and knows that she needs to follow up.  She has not been vomiting in the emergency department for a couple of hours. ____________________________________________  FINAL CLINICAL IMPRESSION(S) / ED DIAGNOSES  Final diagnoses:  Opioid withdrawal  Non-intractable vomiting with nausea, vomiting of unspecified type  Generalized pain      NEW MEDICATIONS STARTED DURING THIS VISIT:  New Prescriptions   ONDANSETRON (ZOFRAN ODT) 4 MG DISINTEGRATING TABLET    Allow 1-2 tablets to dissolve in your mouth every 6 hours as needed for nausea/vomiting   PROMETHAZINE (PHENERGAN) 25 MG SUPPOSITORY    Place 1 suppository (25 mg total) rectally every 6 (six) hours as needed for nausea.     Hinda Kehr, MD 02/28/15 1053

## 2015-02-28 NOTE — ED Notes (Signed)
Pt arrived via EMS from home reporting NVD for the past two days. Pt has been off of pain medication for the past two days due to lost prescriptions and running out of medication. Pt reports tenderness across "tummy" area but is no more specific. Tenderness reported upon palpation of abd. Pt verbalizes chest pain when asked about abd pain.

## 2015-02-28 NOTE — ED Notes (Signed)
Pt informed of need to urine sample. Urine cup placed by toilet for when pt is able to urinate.

## 2015-02-28 NOTE — Progress Notes (Signed)
Dr. Karma Greaser at bedside discussing plan of care with patient and visitor Anderson Malta.  Nurse Jeannie Fend, RN present.

## 2015-02-28 NOTE — Progress Notes (Signed)
Per Genella Rife patient's written prescription was misplaced and she did not realize it until Monday.  Anderson Malta attempted to take patient to walk in clinic but was informed that would be "doctor shopping".  Anderson Malta was advised to have police report completed.  Anderson Malta followed thru on advice.  Anderson Malta also informed nurse that patient last dose of prescribed pain medication was Wednesday.  Patient symptoms started per Anderson Malta 8/18 at [facility] gave nurse copy of hand written information from police officer.  Copy made and placed on chart.

## 2015-05-12 ENCOUNTER — Ambulatory Visit: Admit: 2015-05-12 | Payer: Self-pay | Admitting: Ophthalmology

## 2015-05-12 SURGERY — PHACOEMULSIFICATION, CATARACT, WITH IOL INSERTION
Anesthesia: Choice | Laterality: Left

## 2015-05-19 ENCOUNTER — Other Ambulatory Visit: Payer: Self-pay | Admitting: Internal Medicine

## 2015-05-19 ENCOUNTER — Ambulatory Visit
Admission: RE | Admit: 2015-05-19 | Discharge: 2015-05-19 | Disposition: A | Discharge status: Home / Self Care | Payer: Medicare Other | Source: Ambulatory Visit | Attending: Internal Medicine | Admitting: Internal Medicine

## 2015-05-19 DIAGNOSIS — R05 Cough: Secondary | ICD-10-CM | POA: Insufficient documentation

## 2015-05-19 DIAGNOSIS — R059 Cough, unspecified: Secondary | ICD-10-CM

## 2015-05-19 DIAGNOSIS — Z955 Presence of coronary angioplasty implant and graft: Secondary | ICD-10-CM | POA: Diagnosis not present

## 2015-05-19 DIAGNOSIS — J449 Chronic obstructive pulmonary disease, unspecified: Secondary | ICD-10-CM | POA: Insufficient documentation

## 2015-12-15 ENCOUNTER — Emergency Department
Admission: EM | Admit: 2015-12-15 | Discharge: 2015-12-16 | Disposition: A | Discharge status: Home / Self Care | Payer: Medicare Other | Attending: Emergency Medicine | Admitting: Emergency Medicine

## 2015-12-15 ENCOUNTER — Emergency Department: Payer: Medicare Other

## 2015-12-15 DIAGNOSIS — F22 Delusional disorders: Secondary | ICD-10-CM | POA: Diagnosis not present

## 2015-12-15 DIAGNOSIS — R531 Weakness: Secondary | ICD-10-CM | POA: Diagnosis not present

## 2015-12-15 DIAGNOSIS — R55 Syncope and collapse: Secondary | ICD-10-CM | POA: Diagnosis present

## 2015-12-15 DIAGNOSIS — Z79899 Other long term (current) drug therapy: Secondary | ICD-10-CM | POA: Diagnosis not present

## 2015-12-15 DIAGNOSIS — R4182 Altered mental status, unspecified: Secondary | ICD-10-CM

## 2015-12-15 DIAGNOSIS — T507X1A Poisoning by analeptics and opioid receptor antagonists, accidental (unintentional), initial encounter: Secondary | ICD-10-CM | POA: Insufficient documentation

## 2015-12-15 DIAGNOSIS — R404 Transient alteration of awareness: Secondary | ICD-10-CM | POA: Diagnosis not present

## 2015-12-15 DIAGNOSIS — T50901A Poisoning by unspecified drugs, medicaments and biological substances, accidental (unintentional), initial encounter: Secondary | ICD-10-CM

## 2015-12-15 DIAGNOSIS — F17200 Nicotine dependence, unspecified, uncomplicated: Secondary | ICD-10-CM | POA: Insufficient documentation

## 2015-12-15 LAB — URINALYSIS COMPLETE WITH MICROSCOPIC (ARMC ONLY)
Bacteria, UA: NONE SEEN
Bilirubin Urine: NEGATIVE
GLUCOSE, UA: NEGATIVE mg/dL
Ketones, ur: NEGATIVE mg/dL
LEUKOCYTES UA: NEGATIVE
NITRITE: NEGATIVE
PROTEIN: NEGATIVE mg/dL
SPECIFIC GRAVITY, URINE: 1.006 (ref 1.005–1.030)
Squamous Epithelial / LPF: NONE SEEN
pH: 7 (ref 5.0–8.0)

## 2015-12-15 LAB — CBC WITH DIFFERENTIAL/PLATELET
BASOS PCT: 1 %
Basophils Absolute: 0.1 10*3/uL (ref 0–0.1)
EOS ABS: 0.3 10*3/uL (ref 0–0.7)
EOS PCT: 5 %
HCT: 40.8 % (ref 35.0–47.0)
Hemoglobin: 13.2 g/dL (ref 12.0–16.0)
LYMPHS ABS: 1.8 10*3/uL (ref 1.0–3.6)
Lymphocytes Relative: 31 %
MCH: 29.4 pg (ref 26.0–34.0)
MCHC: 32.4 g/dL (ref 32.0–36.0)
MCV: 90.8 fL (ref 80.0–100.0)
MONOS PCT: 13 %
Monocytes Absolute: 0.8 10*3/uL (ref 0.2–0.9)
Neutro Abs: 2.9 10*3/uL (ref 1.4–6.5)
Neutrophils Relative %: 50 %
PLATELETS: 253 10*3/uL (ref 150–440)
RBC: 4.5 MIL/uL (ref 3.80–5.20)
RDW: 13.3 % (ref 11.5–14.5)
WBC: 5.8 10*3/uL (ref 3.6–11.0)

## 2015-12-15 LAB — COMPREHENSIVE METABOLIC PANEL
ALT: 8 U/L — ABNORMAL LOW (ref 14–54)
ANION GAP: 6 (ref 5–15)
AST: 14 U/L — ABNORMAL LOW (ref 15–41)
Albumin: 4.1 g/dL (ref 3.5–5.0)
Alkaline Phosphatase: 86 U/L (ref 38–126)
BUN: 25 mg/dL — ABNORMAL HIGH (ref 6–20)
CHLORIDE: 102 mmol/L (ref 101–111)
CO2: 31 mmol/L (ref 22–32)
Calcium: 9.1 mg/dL (ref 8.9–10.3)
Creatinine, Ser: 1.15 mg/dL — ABNORMAL HIGH (ref 0.44–1.00)
GFR calc Af Amer: 55 mL/min — ABNORMAL LOW (ref >60)
GFR calc non Af Amer: 47 mL/min — ABNORMAL LOW (ref >60)
Glucose, Bld: 79 mg/dL (ref 65–99)
POTASSIUM: 4.4 mmol/L (ref 3.5–5.1)
SODIUM: 139 mmol/L (ref 135–145)
Total Bilirubin: 0.4 mg/dL (ref 0.3–1.2)
Total Protein: 7.2 g/dL (ref 6.5–8.1)

## 2015-12-15 LAB — TROPONIN I: Troponin I: <0.03 ng/mL (ref <0.031)

## 2015-12-15 LAB — CK: CK TOTAL: 82 U/L (ref 38–234)

## 2015-12-15 MED ORDER — SODIUM CHLORIDE 0.9 % IV SOLN
1000.0000 mL | Freq: Once | INTRAVENOUS | Status: AC
  Administered 2015-12-15: 1000 mL via INTRAVENOUS

## 2015-12-15 MED ORDER — NALOXONE HCL 2 MG/2ML IJ SOSY
0.4000 mg | PREFILLED_SYRINGE | Freq: Once | INTRAMUSCULAR | Status: AC
  Administered 2015-12-15: 0.4 mg via INTRAVENOUS
  Filled 2015-12-15: qty 2

## 2015-12-15 MED ORDER — NALOXONE HCL 0.4 MG/ML IJ SOLN: 0.2000 mg | Freq: Once | INTRAMUSCULAR | Status: DC

## 2015-12-15 MED ORDER — IPRATROPIUM-ALBUTEROL 0.5-2.5 (3) MG/3ML IN SOLN
3.0000 mL | Freq: Once | RESPIRATORY_TRACT | Status: AC
  Administered 2015-12-15: 3 mL via RESPIRATORY_TRACT
  Filled 2015-12-15: qty 3

## 2015-12-15 MED ORDER — LEVETIRACETAM 500 MG PO TABS
1500.0000 mg | ORAL_TABLET | Freq: Two times a day (BID) | ORAL | Status: DC
  Administered 2015-12-16: 1500 mg via ORAL
  Filled 2015-12-15 (×2): qty 3

## 2015-12-15 NOTE — ED Notes (Signed)
Pt came in by EMS after being found on floor x2.  Neighbor told EMS that patient has history of seizures, but per EMS pt was not post ictal.  Pt having difficulty completing sentences and has drooped eyelids.  EDP evaluated and ordered narcan for patient.  Administered per MAR.  Pt agitated and screaming at this time.  Pt shivering all over.  Pt unable to tell this RN what medication was taken at home.  Pt just states "I took my prescribed meds".

## 2015-12-15 NOTE — ED Provider Notes (Signed)
[facility] Emergency Department Provider Note        Time seen: ----------------------------------------- 7:54 PM on 12/15/2015 -----------------------------------------  L5 caveat: Review of systems and history is noted to be altered mental status  I have reviewed the triage vital signs and the nursing notes.   HISTORY  Chief Complaint No chief complaint on file.    HPI Diane Adkins is a 70 y.o. female who presents to ER for altered mental status. She was found on the floor of her home. She was not noted to be postictal. She denies any recent illness, nothing makes her symptoms better or worse.   Past Medical History  Diagnosis Date  . Epilepsy   . Fibromyalgia   . Opioid dependence   . Inflammatory bowel disease     There are no active problems to display for this patient.   Past Surgical History  Procedure Laterality Date  . Cervical spine surgery      Allergies Keflex; Lamotrigine; Macrodantin; Nitrofurantoin; and Statins  Social History Social History  Substance Use Topics  . Smoking status: Current Every Day Smoker -- 0.50 packs/day  . Smokeless tobacco: Not on file  . Alcohol Use: No    Review of Systems Unknown at this time  ____________________________________________   PHYSICAL EXAM:  VITAL SIGNS: ED Triage Vitals  Enc Vitals Group     BP --      Pulse --      Resp --      Temp --      Temp src --      SpO2 --      Weight --      Height --      Head Cir --      Peak Flow --      Pain Score --      Pain Loc --      Pain Edu? --      Excl. in [place]? --     Constitutional: Lethargic, no acute distress Eyes: Conjunctivae are normal. PERRL. Normal extraocular movements. ENT   Head: Normocephalic and atraumatic.   Nose: No congestion/rhinnorhea.   Mouth/Throat: Mucous membranes are dry   Neck: No stridor. Cardiovascular: Normal rate, regular rhythm. No murmurs, rubs, or  gallops. Respiratory: Normal respiratory effort without tachypnea nor retractions. Breath sounds are clear and equal bilaterally. No wheezes/rales/rhonchi. Gastrointestinal: Soft and nontender. Normal bowel sounds Musculoskeletal: Nontender with normal range of motion in all extremities. No lower extremity tenderness nor edema. Neurologic:  Normal speech and language. Generalized weakness, nothing focal Skin:  Skin is warm, dry and intact. No rash noted.  ____________________________________________  EKG: Interpreted by me. Sinus rhythm rate of 57 bpm, normal PR interval, normal QRS, normal QT interval. Nonspecific ST changes  ____________________________________________  ED COURSE:  Pertinent labs & imaging results that were available during my care of the patient were reviewed by me and considered in my medical decision making (see chart for details). Patient resents to ER with weakness and lethargy. We'll check basic labs, start fluids and give low-dose Narcan.  Patient with significant improvement in mental status and alertness after Narcan. ____________________________________________    LABS (pertinent positives/negatives)  Labs Reviewed  COMPREHENSIVE METABOLIC PANEL - Abnormal; Notable for the following:    BUN 25 (*)    Creatinine, Ser 1.15 (*)    AST 14 (*)    ALT 8 (*)    GFR calc non Af Amer 47 (*)    GFR calc  Af Amer 55 (*)    All other components within normal limits  URINALYSIS COMPLETEWITH MICROSCOPIC ([facility] ONLY) - Abnormal; Notable for the following:    Color, Urine YELLOW (*)    APPearance CLEAR (*)    Hgb urine dipstick 1+ (*)    All other components within normal limits  CBC WITH DIFFERENTIAL/PLATELET  TROPONIN I  CK   IMPRESSION: 1. COPD. 2. Left basilar atelectasis.  ____________________________________________  FINAL ASSESSMENT AND PLAN  Weakness, Accidental overdose, paranoia  Plan: Patient with labs and imaging as dictated above. In her  current condition, patient is incapable of taking care of herself. She is a quarter and lives alone. She has been found numerous times recently nearly unconscious or unconscious and likely overmedicated. Medically she appears stable at this time and had dramatic improvement in her mental status after Narcan. We will observe her in the ER, consult social work and psychiatry for placement.   Earleen Newport, MD   Note: This dictation was prepared with Dragon dictation. Any transcriptional errors that result from this process are unintentional   Earleen Newport, MD 12/15/15 2204

## 2015-12-16 DIAGNOSIS — R404 Transient alteration of awareness: Secondary | ICD-10-CM

## 2015-12-16 DIAGNOSIS — R4182 Altered mental status, unspecified: Secondary | ICD-10-CM

## 2015-12-16 MED ORDER — METOPROLOL TARTRATE 25 MG PO TABS
25.0000 mg | ORAL_TABLET | Freq: Two times a day (BID) | ORAL | Status: DC
  Administered 2015-12-16: 25 mg via ORAL
  Filled 2015-12-16: qty 1

## 2015-12-16 MED ORDER — METHADONE HCL 10 MG/ML PO CONC: 10.0000 mg | Freq: Two times a day (BID) | ORAL | Status: DC

## 2015-12-16 MED ORDER — GABAPENTIN 400 MG PO CAPS: 400.0000 mg | ORAL_CAPSULE | Freq: Three times a day (TID) | ORAL | Status: DC

## 2015-12-16 MED ORDER — PAROXETINE HCL 20 MG PO TABS
20.0000 mg | ORAL_TABLET | Freq: Every day | ORAL | Status: DC
  Filled 2015-12-16: qty 1

## 2015-12-16 MED ORDER — RANOLAZINE ER 500 MG PO TB12
500.0000 mg | ORAL_TABLET | Freq: Two times a day (BID) | ORAL | Status: DC
  Filled 2015-12-16: qty 1

## 2015-12-16 MED ORDER — LEVOTHYROXINE SODIUM 100 MCG PO TABS: 100.0000 ug | ORAL_TABLET | Freq: Every day | ORAL | Status: DC

## 2015-12-16 MED ORDER — ALPRAZOLAM 0.5 MG PO TABS
0.2500 mg | ORAL_TABLET | Freq: Two times a day (BID) | ORAL | Status: DC
  Administered 2015-12-16: 0.25 mg via ORAL
  Filled 2015-12-16: qty 1

## 2015-12-16 MED ORDER — CLOPIDOGREL BISULFATE 75 MG PO TABS
75.0000 mg | ORAL_TABLET | Freq: Every day | ORAL | Status: DC
  Administered 2015-12-16: 75 mg via ORAL
  Filled 2015-12-16: qty 1

## 2015-12-16 NOTE — ED Notes (Signed)
Pt discharged home after verbalizing understanding of discharge instructions; nad noted. 

## 2015-12-16 NOTE — ED Provider Notes (Signed)
Social work saw the patient earlier and recommended adult protective services evaluation. They said that they would call. Dr. Lissa Hoard packs came to see the patient feels the patient at present is not incompetent at least as my understanding after talking with him. And he also recommends calling Adult Protective Services. The power of attorney who I spoke to a couple times reports patient is usually sleeping usually in bed very groggy and difficult to arouse is fallen repeatedly and that she is a quarter in her house is piled full of stuff that he she will not let him throughout. He feels the house is unsafe. I will make sure that Adult Protective Services have been notified discharge from the time being.  Nena Polio, MD 12/16/15 [phone]

## 2015-12-16 NOTE — BH Assessment (Signed)
Additional information provided by Celedonio Savage: She sleeps about 20 hours a day. She had an appointment with Dr. Chancy Milroy yesterday.  (She has hearing difficulty). When Mr. Manson Allan arrived about 30 minutes after she returned and she was moaning and on the ground.  He got her up and he got her to walk a little bit (approximately 15 minutes). She appeared in and out of it which is usual.  He went home, after about 15 minutes he went back and found her on the kitchen floor.  She is on medications and she has no family to assist her. She is reported to he hoarding and online shopping frequently.  She takes her medication and forgets, and will take it again. He states that 95 percent of the time she is incoherent.

## 2015-12-16 NOTE — ED Notes (Signed)
Pt rang call bell; when this nurse entered the room, pt's hands were shaking and pt appeared extremely anxious. Initially, pt appeared to be having a seizure, but she was oriented to person, place, time. Pt calmed down with deep breathing and therapeutic touch. Pt states that she does not feel anxious.

## 2015-12-16 NOTE — ED Provider Notes (Signed)
-----------------------------------------   7:28 AM on 12/16/2015 -----------------------------------------  No events overnight. Patient resting in no acute distress. POA visiting. Awaiting clinical social work consult. Care transferred to Dr. Burlene Arnt.  Paulette Blanch, MD 12/16/15 [phone]

## 2015-12-16 NOTE — ED Notes (Addendum)
Margreta Journey, Education officer, museum, states pt doesn't meet criteria for intervention. Pt is alert & oriented and able to make her own decisions. Margreta Journey will make a referral to APS; hopefully they have services that will help pt. Christina to call POA.

## 2015-12-16 NOTE — Discharge Instructions (Signed)
Accidental Overdose °A drug overdose occurs when a chemical substance (drug or medication) is used in amounts large enough to overcome a person. This may result in severe illness or death. This is a type of poisoning. Accidental overdoses of medications or other substances come from a variety of reasons. When this happens accidentally, it is often because the person taking the substance does not know enough about what they have taken. Drugs which commonly cause overdose deaths are alcohol, psychotropic medications (medications which affect the mind), pain medications, illegal drugs (street drugs) such as cocaine and heroin, and multiple drugs taken at the same time. It may result from careless behavior (such as over-indulging at a party). Other causes of overdose may include multiple drug use, a lapse in memory, or drug use after a period of no drug use.  °Sometimes overdosing occurs because a person cannot remember if they have taken their medication.  °A common unintentional overdose in young children involves multi-vitamins containing iron. Iron is a part of the hemoglobin molecule in blood. It is used to transport oxygen to living cells. When taken in small amounts, iron allows the body to restock hemoglobin. In large amounts, it causes problems in the body. If this overdose is not treated, it can lead to death. °Never take medicines that show signs of tampering or do not seem quite right. Never take medicines in the dark or in poor lighting. Read the label and check each dose of medicine before you take it. When adults are poisoned, it happens most often through carelessness or lack of information. Taking medicines in the dark or taking medicine prescribed for someone else to treat the same type of problem is a dangerous practice. °SYMPTOMS  °Symptoms of overdose depend on the medication and amount taken. They can vary from over-activity with stimulant over-dosage, to sleepiness from depressants such as  alcohol, narcotics and tranquilizers. Confusion, dizziness, nausea and vomiting may be present. If problems are severe enough coma and death may result. °DIAGNOSIS  °Diagnosis and management are generally straightforward if the drug is known. Otherwise it is more difficult. At times, certain symptoms and signs exhibited by the patient, or blood tests, can reveal the drug in question.  °TREATMENT  °In an emergency department, most patients can be treated with supportive measures. Antidotes may be available if there has been an overdose of opioids or benzodiazepines. A rapid improvement will often occur if this is the cause of overdose. °At home or away from medical care: °· There may be no immediate problems or warning signs in children. °· Not everything works well in all cases of poisoning. °· Take immediate action. Poisons may act quickly. °· If you think someone has swallowed medicine or a household product, and the person is unconscious, having seizures (convulsions), or is not breathing, immediately call for an ambulance. °IF a person is conscious and appears to be doing OK but has swallowed a poison: °· Do not wait to see what effect the poison will have. Immediately call a poison control center (listed in the white pages of your telephone book under "Poison Control" or inside the front cover with other emergency numbers). Some poison control centers have TTY capability for the deaf. Check with your local center if you or someone in your family requires this service. °· Keep the container so you can read the label on the product for ingredients. °· Describe what, when, and how much was taken and the age and condition of the person poisoned.   Inform them if the person is vomiting, choking, drowsy, shows a change in color or temperature of skin, is conscious or unconscious, or is convulsing.  Do not cause vomiting unless instructed by medical personnel. Do not induce vomiting or force liquids into a person who  is convulsing, unconscious, or very drowsy. Stay calm and in control.   Activated charcoal also is sometimes used in certain types of poisoning and you may wish to add a supply to your emergency medicines. It is available without a prescription. Call a poison control center before using this medication. PREVENTION  Thousands of children die every year from unintentional poisoning. This may be from household chemicals, poisoning from carbon monoxide in a car, taking their parent's medications, or simply taking a few iron pills or vitamins with iron. Poisoning comes from unexpected sources.  Store medicines out of the sight and reach of children, preferably in a locked cabinet. Do not keep medications in a food cabinet. Always store your medicines in a secure place. Get rid of expired medications.  If you have children living with you or have them as occasional guests, you should have child-resistant caps on your medicine containers. Keep everything out of reach. Child proof your home.  If you are called to the telephone or to answer the door while you are taking a medicine, take the container with you or put the medicine out of the reach of small children.  Do not take your medication in front of children. Do not tell your child how good a medication is and how good it is for them. They may get the idea it is more of a treat.  If you are an adult and have accidentally taken an overdose, you need to consider how this happened and what can be done to prevent it from happening again. If this was from a street drug or alcohol, determine if there is a problem that needs addressing. If you are not sure a problems exists, it is easy to talk to a professional and ask them if they think you have a problem. It is better to handle this problem in this way before it happens again and has a much worse consequence.   This information is not intended to replace advice given to you by your health care provider. Make  sure you discuss any questions you have with your health care provider.   Document Released: 09/11/2004 Document Revised: 07/19/2014 Document Reviewed: 12/16/2014 Elsevier Interactive Patient Education [employer].   Please follow-up with your regular doctor later this week. He may need to lower your drug dosage slightly seizure not so sleepy.
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
## 2015-12-16 NOTE — ED Notes (Signed)
Pt given a sandwich tray ?

## 2015-12-16 NOTE — Consult Note (Signed)
[facility] Psychiatry Consult   Reason for Consult:  Consult for 70 year old woman brought in after being found unconscious at home Referring Physician:  McShane Patient Identification: Diane Adkins MRN:  [id number] Principal Diagnosis: Altered mental status Diagnosis:   Patient Active Problem List   Diagnosis Date Noted  . Altered mental status [R41.82] 12/16/2015    Total Time spent with patient: 45 minutes  Subjective:   Diane Adkins is a 70 y.o. female patient admitted with "I really don't know".  HPI:  Patient interviewed. Chart reviewed. Case discussed with emergency room attending. 56 year old woman brought in after her neighbor found her unconscious on the floor. Unknown how long she had been down. Since being here the patient was initially confused seems to of perked up a little bit. On interview today the patient is aware that she was found unconscious on her floor. Doesn't know how long she had been there. Doesn't seem particularly concerned about it. She really has little idea why she would've been unconscious on the floor. Speculates that it might of been a seizure. Patient denies being depressed. Denies suicidal or homicidal ideation. Denies hallucinations. Says that she sleeps reasonably well. She admits that her appetite is poor and she has lost a lot of weight over the last several months. She has chronic pain from multiple orthopedic problems. Tends to blame a lot of her problems on her chronic orthopedic problems. She denies however that she has been using excessive amounts of pain medicine or sedating medicine. Denies alcohol use or any other drug abuse. Her neighbor who is present and found her says that she is frequently confused and paranoid. Often will feel like people are breaking into her house. 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  Social history: Living by herself at home.  Medical history: Multiple orthopedic problems over the  years. Hip replacements etc. Also history of seizure disorder.  Substance abuse history: Denies any history of alcohol abuse denies that she abuses any of her medicine.  Past Psychiatric History: Initially she denied any history of psychiatric illness but then admitted that she has seen a psychiatrist in the past and she is even been admitted to the psychiatric hospital. Even on admitting that she claims that she doesn't know what kind of problem she had. Denies any history of suicide attempts or violence. She says maybe she was depressed.  Risk to Self: Suicidal Ideation: No Suicidal Intent: No Is patient at risk for suicide?: No Suicidal Plan?: No Access to Means: No What has been your use of drugs/alcohol within the last 12 months?: Denied use of drugs How many times?: 0 Other Self Harm Risks: denied self harm Triggers for Past Attempts: None known Intentional Self Injurious Behavior: None Risk to Others: Homicidal Ideation: No Thoughts of Harm to Others: No Current Homicidal Intent: No Current Homicidal Plan: No Access to Homicidal Means: No Identified Victim: None identified History of harm to others?: No Assessment of Violence: None Noted Violent Behavior Description: denied Does patient have access to weapons?: No Criminal Charges Pending?: No Does patient have a court date: No Prior Inpatient Therapy: Prior Inpatient Therapy: Yes Prior Therapy Dates: 2007 Prior Therapy Facilty/Provider(s): [facility] Reason for Treatment: Depression Prior Outpatient Therapy: Prior Outpatient Therapy: No Prior Therapy Dates: n/a Prior Therapy Facilty/Provider(s): n/a Reason for Treatment: n/a Does patient have an ACCT team?: No Does patient have Intensive In-House Services?  : No Does patient have [facility] services? : No Does patient  have P4CC services?: No  Past Medical History:  Past Medical History  Diagnosis Date  . Epilepsy ([place])   . Fibromyalgia   . Opioid dependence ([place])   .  Inflammatory bowel disease     Past Surgical History  Procedure Laterality Date  . Cervical spine surgery     Family History: No family history on file. Family Psychiatric  History: Denies any family history of mental illness except alcohol abuse in her father. Social History:  History  Alcohol Use No     History  Drug Use Not on file    Social History   Social History  . Marital Status: Divorced    Spouse Name: N/A  . Number of Children: N/A  . Years of Education: N/A   Social History Main Topics  . Smoking status: Current Every Day Smoker -- 0.50 packs/day  . Smokeless tobacco: None  . Alcohol Use: No  . Drug Use: None  . Sexual Activity: Not Asked   Other Topics Concern  . None   Social History Narrative   Additional Social History:    Allergies:   Allergies  Allergen Reactions  . Keflex [Cephalexin] Other (See Comments)    Reaction: unknown  . Lamotrigine Itching and Swelling  . Macrodantin [Nitrofurantoin Macrocrystal] Other (See Comments)    Reaction: unknown  . Nitrofurantoin Hives  . Statins Hives    Labs:  Results for orders placed or performed during the hospital encounter of 12/15/15 (from the past 48 hour(s))  CBC with Differential     Status: None   Collection Time: 12/15/15  7:35 PM  Result Value Ref Range   WBC 5.8 3.6 - 11.0 K/uL   RBC 4.50 3.80 - 5.20 MIL/uL   Hemoglobin 13.2 12.0 - 16.0 g/dL   HCT 40.8 35.0 - 47.0 %   MCV 90.8 80.0 - 100.0 fL   MCH 29.4 26.0 - 34.0 pg   MCHC 32.4 32.0 - 36.0 g/dL   RDW 13.3 11.5 - 14.5 %   Platelets 253 150 - 440 K/uL   Neutrophils Relative % 50 %   Neutro Abs 2.9 1.4 - 6.5 K/uL   Lymphocytes Relative 31 %   Lymphs Abs 1.8 1.0 - 3.6 K/uL   Monocytes Relative 13 %   Monocytes Absolute 0.8 0.2 - 0.9 K/uL   Eosinophils Relative 5 %   Eosinophils Absolute 0.3 0 - 0.7 K/uL   Basophils Relative 1 %   Basophils Absolute 0.1 0 - 0.1 K/uL  Comprehensive metabolic panel     Status: Abnormal    Collection Time: 12/15/15  7:35 PM  Result Value Ref Range   Sodium 139 135 - 145 mmol/L   Potassium 4.4 3.5 - 5.1 mmol/L   Chloride 102 101 - 111 mmol/L   CO2 31 22 - 32 mmol/L   Glucose, Bld 79 65 - 99 mg/dL   BUN 25 (H) 6 - 20 mg/dL   Creatinine, Ser 1.15 (H) 0.44 - 1.00 mg/dL   Calcium 9.1 8.9 - 10.3 mg/dL   Total Protein 7.2 6.5 - 8.1 g/dL   Albumin 4.1 3.5 - 5.0 g/dL   AST 14 (L) 15 - 41 U/L   ALT 8 (L) 14 - 54 U/L   Alkaline Phosphatase 86 38 - 126 U/L   Total Bilirubin 0.4 0.3 - 1.2 mg/dL   GFR calc non Af Amer 47 (L) >60 mL/min   GFR calc Af Amer 55 (L) >60 mL/min    Comment: (NOTE)  The eGFR has been calculated using the CKD EPI equation. This calculation has not been validated in all clinical situations. eGFR's persistently <60 mL/min signify possible Chronic Kidney Disease.    Anion gap 6 5 - 15  Troponin I     Status: None   Collection Time: 12/15/15  7:35 PM  Result Value Ref Range   Troponin I <0.03 <0.031 ng/mL    Comment:        NO INDICATION OF MYOCARDIAL INJURY.   CK     Status: None   Collection Time: 12/15/15  7:35 PM  Result Value Ref Range   Total CK 82 38 - 234 U/L  Urinalysis complete, with microscopic     Status: Abnormal   Collection Time: 12/15/15  9:40 PM  Result Value Ref Range   Color, Urine YELLOW (A) YELLOW   APPearance CLEAR (A) CLEAR   Glucose, UA NEGATIVE NEGATIVE mg/dL   Bilirubin Urine NEGATIVE NEGATIVE   Ketones, ur NEGATIVE NEGATIVE mg/dL   Specific Gravity, Urine 1.006 1.005 - 1.030   Hgb urine dipstick 1+ (A) NEGATIVE   pH 7.0 5.0 - 8.0   Protein, ur NEGATIVE NEGATIVE mg/dL   Nitrite NEGATIVE NEGATIVE   Leukocytes, UA NEGATIVE NEGATIVE   RBC / HPF 0-5 0 - 5 RBC/hpf   WBC, UA 0-5 0 - 5 WBC/hpf   Bacteria, UA NONE SEEN NONE SEEN   Squamous Epithelial / LPF NONE SEEN NONE SEEN    Current Facility-Administered Medications  Medication Dose Route Frequency Provider Last Rate Last Dose  . ALPRAZolam Duanne Moron) tablet 0.25 mg   0.25 mg Oral BID Schuyler Amor, MD   0.25 mg at 12/16/15 1420  . clopidogrel (PLAVIX) tablet 75 mg  75 mg Oral Daily Schuyler Amor, MD   75 mg at 12/16/15 1600  . gabapentin (NEURONTIN) capsule 400 mg  400 mg Oral TID Schuyler Amor, MD      . levETIRAcetam (KEPPRA) tablet 1,500 mg  1,500 mg Oral BID Paulette Blanch, MD   1,500 mg at 12/16/15 1420  . [START ON 12/17/2015] levothyroxine (SYNTHROID, LEVOTHROID) tablet 100 mcg  100 mcg Oral QAC breakfast Schuyler Amor, MD      . methadone (DOLOPHINE) 10 MG/ML solution 10 mg  10 mg Oral Q12H Schuyler Amor, MD      . metoprolol tartrate (LOPRESSOR) tablet 25 mg  25 mg Oral BID Schuyler Amor, MD   25 mg at 12/16/15 1601  . naloxone [facility]) injection 0.2 mg  0.2 mg Intravenous Once Earleen Newport, MD   0.2 mg at 12/16/15 0441  . PARoxetine (PAXIL) tablet 20 mg  20 mg Oral Daily Schuyler Amor, MD      . ranolazine (RANEXA) 12 hr tablet 500 mg  500 mg Oral BID Schuyler Amor, MD       Current Outpatient Prescriptions  Medication Sig Dispense Refill  . ALPRAZolam (XANAX) 0.25 MG tablet Take 0.25 mg by mouth 2 (two) times daily as needed for anxiety.     . clopidogrel (PLAVIX) 75 MG tablet Take 75 mg by mouth daily.    . clotrimazole-betamethasone (LOTRISONE) cream Apply 1 application topically 2 (two) times daily.  0  . fluticasone (FLONASE) 50 MCG/ACT nasal spray Place 2 sprays into both nostrils daily as needed.  12  . gabapentin (NEURONTIN) 400 MG capsule Take 400 mg by mouth 3 (three) times daily.    Marland Kitchen levETIRAcetam (KEPPRA) 1000 MG tablet Take  1,500 mg by mouth 2 (two) times daily.     Marland Kitchen levothyroxine (SYNTHROID, LEVOTHROID) 100 MCG tablet Take 100 mcg by mouth daily before breakfast.    . methadone (DOLOPHINE) 10 MG tablet Take 10 mg by mouth every 12 (twelve) hours.  0  . metoprolol tartrate (LOPRESSOR) 25 MG tablet Take 25 mg by mouth 2 (two) times daily.    Marland Kitchen PARoxetine (PAXIL) 20 MG tablet Take 20 mg by mouth daily.    .  ranolazine (RANEXA) 500 MG 12 hr tablet Take 500 mg by mouth 2 (two) times daily.      Musculoskeletal: Strength & Muscle Tone: decreased Gait & Station: unsteady Patient leans: N/A  Psychiatric Specialty Exam: Physical Exam  Constitutional: She appears well-developed and well-nourished. She appears listless. She appears cachectic. She has a sickly appearance.  HENT:  Head: Normocephalic and atraumatic.  Eyes: Conjunctivae are normal. Pupils are equal, round, and reactive to light.  Neck: Normal range of motion.  Cardiovascular: Normal heart sounds.   Respiratory: Effort normal.  GI: Soft.  Musculoskeletal: Normal range of motion.  Neurological: She appears listless.  Skin: Skin is warm and dry.  Psychiatric: Judgment normal. Her affect is blunt. Her speech is delayed. She is slowed. Thought content is paranoid. She exhibits abnormal recent memory.    Review of Systems  Constitutional: Positive for weight loss and malaise/fatigue.  HENT: Negative.   Eyes: Negative.   Respiratory: Negative.   Cardiovascular: Negative.   Gastrointestinal: Negative.   Musculoskeletal: Negative.   Skin: Negative.   Neurological: Positive for weakness.  Psychiatric/Behavioral: Positive for memory loss. Negative for depression, suicidal ideas, hallucinations and substance abuse. The patient is not nervous/anxious and does not have insomnia.     Blood pressure 144/76, pulse 77, temperature 97.5 F (36.4 C), temperature source Oral, resp. rate 13, SpO2 93 %.There is no weight on file to calculate BMI.  General Appearance: Disheveled  Eye Contact:  Fair  Speech:  Slow  Volume:  Decreased  Mood:  Anxious  Affect:  Congruent  Thought Process:  Irrelevant  Orientation:  Full (Time, Place, and Person)  Thought Content:  Rumination  Suicidal Thoughts:  No  Homicidal Thoughts:  No  Memory:  Immediate;   Fair Recent;   Fair Remote;   Poor  Judgement:  Impaired  Insight:  Shallow  Psychomotor  Activity:  Decreased  Concentration:  Concentration: Poor  Recall:  [employer] of Knowledge:  Fair  Language:  Fair  Akathisia:  No  Handed:  Right  AIMS (if indicated):     Assets:  Housing Social Support  ADL's:  Impaired  Cognition:  Impaired,  Mild  Sleep:        Treatment Plan Summary: Plan 70 year old woman. Presents without any specific complaints. No clear evidence of suicidal intent. Certainly could've been misusing medicine. Didn't do a formal Mini-Mental Status exam but I think there is a lot of evidence here that she is probably demented and having some paranoid and psychotic symptoms at times. The testimony of her neighbor is that her home is unsafe. Patient admits that there is something to that but says she would never move out because she would not be able to have her dog anymore. Patient does not have an acutely treatable psychiatric illness. Does not require psychiatric hospitalization. No adjustment to any medicine. Education done about the dangers of being on Xanax and narcotics together. Social work has been involved in adult protective services will be notified.  Disposition: Patient does not meet criteria for psychiatric inpatient admission. Supportive therapy provided about ongoing stressors.  Alethia Berthold, MD 12/16/2015 5:50 PM

## 2015-12-16 NOTE — ED Notes (Signed)
Pt assisted to toilet. States that she does not know what happened and that she only took methadone. States she has not taken oxycontin in several months, nor has she taken any other narcotics. Pt alert & oriented with warm, dry skin. NAD noted.

## 2015-12-16 NOTE — Clinical Social Work Note (Signed)
Clinical Social Work Assessment  Patient Details  Name: Camelle Henkels MRN: [id number] Date of Birth: 1945-11-01  Date of referral:  12/16/15               Reason for consult:  Discharge Planning                Permission sought to share information with:  Other Permission granted to share information::     Name::        Agency::     Relationship::   Dominica Severin [place] [phone])  Contact Information:     Housing/Transportation Living arrangements for the past 2 months:  [place] of Information:  Patient, Other (Comment Required) Dominica Severin [place] [phone]) Patient Interpreter Needed:  None Criminal Activity/Legal Involvement Pertinent to Current Situation/Hospitalization:  No - Comment as needed Significant Relationships:  Friend ([place] [phone]) Lives with:  Self Do you feel safe going back to the place where you live?  Yes Need for family participation in patient care:  Yes (Comment) ([place] [phone])  Care giving concerns:  Patient's POA- Celedonio Savage reports that patient is a hoarder, frequent falls, overmedicated, and found multiple times on floor by POA (neighbor).   Social Worker assessment / plan:  CSW was consulted by MD for the above concerns. CSW met with patient at bedside. Per patient she lives at home alone. She stated that she uses a cane to get around. Patient reported that she's had several falls over the past few weeks. She stated that it was her medication that caused her to fall. Patient reports that she gets around with a cane and that her house is cluttered. Stated that she has a lot of stuff that she needs to clean. Patient reports it's hard for her to clean because she's tired due to her medications. Patient reported that when she fell she blacked out and did not remember what happened. She sated that she has been diagnosed with depression and that she does not have any family in the area. Grated CSW verbal permission to  speak to [employer]. She reports that he's her neighbor. She stated that he helps her. Patient reported that her 2nd ex-husband took her adopted son when they go a divorce and that her 1st ex-husband will begin getting SSI and that she'll be getting a portion of it. Patient reports that she'll then get assistance with cleaning her home. Patient reports that she wanted to return home at discharge. Stated she feels safe to return home. CSW spoke to patient's POA [facility] 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 Stated that you cannot move in patient's home and that it's unsafe. Reported that patient takes too much of her medications. Stated patient stays in the bed about 20 hours a day. CSW made a APS report with the information above. Provide APS intake worker with Mr. Junious Dresser number to provide more information. Discussed case with RNCM. Per RNCM patient isn't home-bound and would not qualify for [facility] RN to assist with her medications. There are no other CSW needs at this time. CSW is signing off but is available if a CSW need were to arise.    Employment status:  Retired Nurse, adult PT Recommendations:  Not assessed at this time [place] / Referral to community resources:  APS (Comment Required: [place], Name & Number of worker spoken with) (CSW made an APS report )  Patient/Family's Response  to care:  Patient reported that she lives alone and that she is appreciative of CSW coming to speak to her.   Patient/Family's Understanding of and Emotional Response to Diagnosis, Current Treatment, and Prognosis:  Patient understands why she's in the ER and stated that she feels safe to go home.   Emotional Assessment Appearance:  Appears stated age Attitude/Demeanor/Rapport:  Lethargic Affect (typically observed):  Calm Orientation:  Oriented to Self, Oriented to Place, Oriented to  Time, Oriented to Situation Alcohol / Substance use:  Other (Opioid ) Psych  involvement (Current and /or in the community):  Yes (Comment) (Psych consult pending )  Discharge Needs  Concerns to be addressed:  Discharge Planning Concerns Readmission within the last 30 days:  No Current discharge risk:  Substance Abuse Barriers to Discharge:  Active Substance Use, Continued Medical Work up   [employer], LCSW 12/16/2015, 2:37 PM

## 2015-12-16 NOTE — ED Provider Notes (Signed)
-----------------------------------------   3:17 PM on 12/16/2015 -----------------------------------------  In nad. Re-wrote home meds, awaiting psych input per prior md plan. Signed out to dr. Cinda Quest at the end of my shift.  Schuyler Amor, MD 12/16/15 [phone]

## 2016-01-08 ENCOUNTER — Telehealth: Payer: Self-pay | Admitting: *Deleted

## 2016-01-08 NOTE — Telephone Encounter (Signed)
Lvm MADE THE PT AWARE NOT TO COME TO THE APPT 01/30/16 @ 1PM DUE TO HER REFERRAL NEEDS TO BE REVIEWED BY DR. Dossie Arbour first....td

## 2016-01-08 NOTE — Telephone Encounter (Signed)
Returned the pt call. lmv gave appt for 01/30/16@ 1pm....td

## 2016-01-30 ENCOUNTER — Ambulatory Visit: Payer: Medicare Other | Admitting: Pain Medicine

## 2016-02-17 ENCOUNTER — Ambulatory Visit: Payer: Medicare Other | Attending: Pain Medicine | Admitting: Pain Medicine

## 2016-02-17 ENCOUNTER — Encounter: Payer: Self-pay | Admitting: Pain Medicine

## 2016-02-17 ENCOUNTER — Encounter (INDEPENDENT_AMBULATORY_CARE_PROVIDER_SITE_OTHER): Payer: Self-pay

## 2016-02-17 VITALS — BP 136/64 | HR 67 | Temp 98.2°F | Resp 18 | Ht 62.0 in | Wt 100.0 lb

## 2016-02-17 DIAGNOSIS — F329 Major depressive disorder, single episode, unspecified: Secondary | ICD-10-CM | POA: Diagnosis not present

## 2016-02-17 DIAGNOSIS — Z5181 Encounter for therapeutic drug level monitoring: Secondary | ICD-10-CM | POA: Insufficient documentation

## 2016-02-17 DIAGNOSIS — M545 Low back pain, unspecified: Secondary | ICD-10-CM

## 2016-02-17 DIAGNOSIS — M961 Postlaminectomy syndrome, not elsewhere classified: Secondary | ICD-10-CM | POA: Insufficient documentation

## 2016-02-17 DIAGNOSIS — G8929 Other chronic pain: Secondary | ICD-10-CM | POA: Diagnosis not present

## 2016-02-17 DIAGNOSIS — R208 Other disturbances of skin sensation: Secondary | ICD-10-CM

## 2016-02-17 DIAGNOSIS — Z96642 Presence of left artificial hip joint: Secondary | ICD-10-CM

## 2016-02-17 DIAGNOSIS — I10 Essential (primary) hypertension: Secondary | ICD-10-CM | POA: Diagnosis not present

## 2016-02-17 DIAGNOSIS — E785 Hyperlipidemia, unspecified: Secondary | ICD-10-CM | POA: Insufficient documentation

## 2016-02-17 DIAGNOSIS — Z955 Presence of coronary angioplasty implant and graft: Secondary | ICD-10-CM | POA: Diagnosis not present

## 2016-02-17 DIAGNOSIS — Z86010 Personal history of colonic polyps: Secondary | ICD-10-CM | POA: Insufficient documentation

## 2016-02-17 DIAGNOSIS — J449 Chronic obstructive pulmonary disease, unspecified: Secondary | ICD-10-CM | POA: Diagnosis not present

## 2016-02-17 DIAGNOSIS — Z79899 Other long term (current) drug therapy: Secondary | ICD-10-CM

## 2016-02-17 DIAGNOSIS — M542 Cervicalgia: Secondary | ICD-10-CM | POA: Insufficient documentation

## 2016-02-17 DIAGNOSIS — F1120 Opioid dependence, uncomplicated: Secondary | ICD-10-CM

## 2016-02-17 DIAGNOSIS — H3530 Unspecified macular degeneration: Secondary | ICD-10-CM | POA: Diagnosis not present

## 2016-02-17 DIAGNOSIS — Z79891 Long term (current) use of opiate analgesic: Secondary | ICD-10-CM | POA: Insufficient documentation

## 2016-02-17 DIAGNOSIS — M069 Rheumatoid arthritis, unspecified: Secondary | ICD-10-CM | POA: Insufficient documentation

## 2016-02-17 DIAGNOSIS — Z981 Arthrodesis status: Secondary | ICD-10-CM | POA: Insufficient documentation

## 2016-02-17 DIAGNOSIS — F1190 Opioid use, unspecified, uncomplicated: Secondary | ICD-10-CM | POA: Diagnosis not present

## 2016-02-17 DIAGNOSIS — M546 Pain in thoracic spine: Secondary | ICD-10-CM | POA: Insufficient documentation

## 2016-02-17 DIAGNOSIS — G5603 Carpal tunnel syndrome, bilateral upper limbs: Secondary | ICD-10-CM | POA: Diagnosis not present

## 2016-02-17 DIAGNOSIS — Z8614 Personal history of Methicillin resistant Staphylococcus aureus infection: Secondary | ICD-10-CM | POA: Diagnosis not present

## 2016-02-17 DIAGNOSIS — M797 Fibromyalgia: Secondary | ICD-10-CM | POA: Insufficient documentation

## 2016-02-17 DIAGNOSIS — R202 Paresthesia of skin: Secondary | ICD-10-CM

## 2016-02-17 DIAGNOSIS — R4182 Altered mental status, unspecified: Secondary | ICD-10-CM | POA: Insufficient documentation

## 2016-02-17 DIAGNOSIS — M549 Dorsalgia, unspecified: Secondary | ICD-10-CM

## 2016-02-17 DIAGNOSIS — M4806 Spinal stenosis, lumbar region: Secondary | ICD-10-CM | POA: Insufficient documentation

## 2016-02-17 DIAGNOSIS — M47812 Spondylosis without myelopathy or radiculopathy, cervical region: Secondary | ICD-10-CM | POA: Diagnosis not present

## 2016-02-17 DIAGNOSIS — K589 Irritable bowel syndrome without diarrhea: Secondary | ICD-10-CM | POA: Insufficient documentation

## 2016-02-17 DIAGNOSIS — G40909 Epilepsy, unspecified, not intractable, without status epilepticus: Secondary | ICD-10-CM | POA: Insufficient documentation

## 2016-02-17 DIAGNOSIS — I2510 Atherosclerotic heart disease of native coronary artery without angina pectoris: Secondary | ICD-10-CM | POA: Diagnosis not present

## 2016-02-17 DIAGNOSIS — M47816 Spondylosis without myelopathy or radiculopathy, lumbar region: Secondary | ICD-10-CM | POA: Diagnosis not present

## 2016-02-17 DIAGNOSIS — M329 Systemic lupus erythematosus, unspecified: Secondary | ICD-10-CM | POA: Insufficient documentation

## 2016-02-17 DIAGNOSIS — Z0189 Encounter for other specified special examinations: Secondary | ICD-10-CM | POA: Insufficient documentation

## 2016-02-17 DIAGNOSIS — E039 Hypothyroidism, unspecified: Secondary | ICD-10-CM | POA: Insufficient documentation

## 2016-02-17 DIAGNOSIS — K219 Gastro-esophageal reflux disease without esophagitis: Secondary | ICD-10-CM | POA: Insufficient documentation

## 2016-02-17 DIAGNOSIS — R1310 Dysphagia, unspecified: Secondary | ICD-10-CM | POA: Diagnosis not present

## 2016-02-17 DIAGNOSIS — M2578 Osteophyte, vertebrae: Secondary | ICD-10-CM | POA: Insufficient documentation

## 2016-02-17 DIAGNOSIS — M419 Scoliosis, unspecified: Secondary | ICD-10-CM | POA: Insufficient documentation

## 2016-02-17 DIAGNOSIS — A4902 Methicillin resistant Staphylococcus aureus infection, unspecified site: Secondary | ICD-10-CM | POA: Insufficient documentation

## 2016-02-17 DIAGNOSIS — R200 Anesthesia of skin: Secondary | ICD-10-CM | POA: Insufficient documentation

## 2016-02-17 DIAGNOSIS — Z7982 Long term (current) use of aspirin: Secondary | ICD-10-CM | POA: Insufficient documentation

## 2016-02-17 DIAGNOSIS — F419 Anxiety disorder, unspecified: Secondary | ICD-10-CM | POA: Insufficient documentation

## 2016-02-17 DIAGNOSIS — Z8619 Personal history of other infectious and parasitic diseases: Secondary | ICD-10-CM | POA: Insufficient documentation

## 2016-02-17 DIAGNOSIS — Z87891 Personal history of nicotine dependence: Secondary | ICD-10-CM | POA: Insufficient documentation

## 2016-02-17 DIAGNOSIS — M539 Dorsopathy, unspecified: Secondary | ICD-10-CM

## 2016-02-17 DIAGNOSIS — M1990 Unspecified osteoarthritis, unspecified site: Secondary | ICD-10-CM | POA: Insufficient documentation

## 2016-02-17 HISTORY — DX: Opioid dependence, uncomplicated: F11.20

## 2016-02-17 NOTE — Patient Instructions (Signed)
Instructed to go get labs and xrays done today [place] medical mall.  Patient is aware of the location to the medical mall.

## 2016-02-17 NOTE — Progress Notes (Signed)
Safety precautions to be maintained throughout the outpatient stay will include: orient to surroundings, keep bed in low position, maintain call bell within reach at all times, provide assistance with transfer out of bed and ambulation.  

## 2016-02-18 NOTE — Progress Notes (Signed)
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 MOVEMENT MEASURED FROM THE SPINOUS PROCESS OF C7 TO THE SPINOUS PROCESS OF C4. AP VIEWS SHOW THE HARDWARE CENTERED IN THE MIDLINE AND GROSSLY INTACT. OBLIQUE VIEWS SHOW SOME FORAMINAL NARROWING AT C6-7 ON THE LEFT. IMPRESSION SOLID FUSION, C4-C7. BONY FORAMINAL NARROWING, C6-7,  LEFT. NO ABNORMAL MOVEMENT BETWEEN FLEXION AND EXTENSION. MRI CERVICAL SPINE MULTIPLANAR T1- AND T2-WEIGHTED IMAGES ARE OBTAINED PRE AND POST CONTRAST.  THERE HAS BEEN PREVIOUS FUSION FROM C4 THROUGH C7 WITH ANTERIOR PLATING AT C4-5-6.  THE ALIGNMENT IS SATISFACTORY. THERE IS A BROAD-BASED CENTRAL DISC PROTRUSION AT C3-4 WITH EFFACEMENT OF THE ANTERIOR SUBARACHNOID SPACE BUT NO CORD COMPRESSION.  THERE IS NO ABNORMAL CORD SIGNAL SEEN AT C3-4.  NO C4 NERVE ROOT ENCROACHMENT IS EVIDENT. THERE IS CONSIDERABLE ARTIFACTUAL SIGNAL LOSS AT C4 AND BELOW.  THERE DOES APPEAR TO BE BONY FORAMINAL NARROWING AT C6-7 ON THE LEFT AS IDENTIFIED FROM PLAIN FILMS.   WHEN COMPARED WITH PREVIOUS MRI OF THE CERVICAL SPINE FROM 06/04/01, THE DISC PROTRUSION AT C3-4 IS CONSIDERABLY LARGER. FOLLOWING ADMINISTRATION OF CONTRAST, THERE IS NO ABNORMAL ENHANCEMENT. IMPRESSION STATUS POST FUSION, C4-C7. CENTRAL HNP, C3-4 WITH EFFACEMENT OF THE ANTERIOR SUBARACHNOID SPACE BUT NO CORD COMPRESSION. BONY FORAMINAL NARROWING AT C6-7, LEFT. MRI THORACIC SPINE MULTIPLANAR T1- AND T2-WEIGHTED IMAGES ARE OBTAINED PRE AND POST CONTRAST.  THERE IS ADVANCED DEGENERATIVE DISC DISEASE AT T11-12 WHERE THERE IS NEAR COMPLETE LOSS OF INTERSPACE HEIGHT. SIMILAR LESS SEVERE CHANGES ARE SEEN AT T9-10 AND T10-11.  VERTEBRAL BODY ALIGNMENT IS SATISFACTORY, ALTHOUGH THERE IS MILD  KYPHOTIC ANGULATION AT T11-12 DUE TO MILD COMPRESSION OF THE T12 VERTEBRA.  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 AT T9-10, THERE IS A FAIRLY PROMINENT DISC PROTRUSION, CENTRAL AND TO THE LEFT.  THERE IS EFFACEMENT OF THE ANTERIOR SUBARACHNOID SPACE BUT NO DEFINITE CORD FLATTENING.  AT T8-9, THERE IS A SMALL CENTRAL PROTRUSION. WHEN COMPARED WITH THE PREVIOUS STUDY FROM 09/03/99, THERE IS LITTLE CHANGE. FOLLOWING ADMINISTRATION OF CONTRAST, THERE IS NO ABNORMAL ENHANCEMENT OF THE THORACIC SPINE OR SURROUNDING STRUCTURES. 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 MODERATE SIZE DISC PROTRUSION, T9-10  CENTRAL AND TO THE LEFT ALONG WITH A SMALLER [place] PROTRUSION AT T8-9 CENTRALLY. THERE IS NO SIGNIFICANT INTERVAL CHANGE WHEN COMPARED WITH 09/03/99. MRI LUMBAR SPINE MULTIPLANAR T1- AND T2-WEIGHTED IMAGES ARE OBTAINED PRE AND POST CONTRAST. THE PATIENT HAS UNDERGONE PREVIOUS PLACEMENT OF THREADED INTERBODY CAGES AT L3-4.  THE CAGES APPEAR TO BE IN GOOD POSITION.  THE ALIGNMENT IS SATISFACTORY.  THERE IS ENHANCEMENT OF THE END PLATES ABOVE AND BELOW, L3-4 AN EXPECTED POST-OP  FINDING.  THERE IS ADVANCED DEGENERATIVE CHANGES AT L2-3 WITH ENHANCEMENT OF END PLATES ABOVE AND BELOW L2-3, REFLECTING ACTIVE ONGOING  DEGENERATIVE CHANGE.  THE CONUS MEDULLARIS IS WITHIN NORMAL LIMITS AND THE INDIVIDUAL DISC SPACES ARE EXAMINED AS FOLLOWS. L1-2:  MILD CENTRAL PROTRUSION. NO ROOT CUTOFF OR STENOSIS. L2-3:  BROAD-BASED DISC PROTRUSION, CENTRAL AND TO THE LEFT.  LEFT L2 AND L3 NERVE ROOT ENCROACHMENT IS PRESENT. L3-4:  SATISFACTORY POST-OPERATIVE APPEARANCE. L4-5:  SMALL CENTRAL PROTRUSION.  NO ROOT CUTOFF OR SPINAL STENOSIS. MARKED POSTERIOR ELEMENT HYPERTROPHY IS SEEN. L5-S1:  MARKED POSTERIOR ELEMENT HYPERTROPHY. NO STENOSIS OR DISC PROTRUSION. COMPARED WITH 04/08/00, THE DISC PROTRUSIONS AT L1-2 AND L2-3 HAVE DECREASED SOMEWHAT IN SIZE.   IN ADDITION THE DISC PROTRUSION AT L4-5 HAS ALSO DECREASED IN SIZE. IMPRESSION MODERATE DISC PROTRUSION, L2-3, CENTRAL AND TO THE LEFT WITH LEFT L2 AND LEFT L3 NERVE ROOT ENCROACHMENT. SMALLER CENTRAL PROTRUSIONS OF L1-2 AND L4-5. STATUS POST THREADED INTERBODY CAGES, L3-4. COMPARED WITH THE PREVIOUS STUDY FROM 04/08/00, THE DISC PROTRUSIONS HAVE ALL DECREASED SLIGHTLY IN SIZE.  Provider: Margaretmary Eddy   Cervical DG complete:  Results for orders placed in visit on 08/28/99  DG Cervical Spine Complete   Narrative FINDINGS CLINICAL:  CERVICAL SPINE FUSION. COMPLETE C-SPINE: COMPLETE VIEWS OF THE CERVICAL SPINE ARE COMPARED TO A LATERAL VIEW OF 07/16/99.  THE ANTERIOR FUSION AT C4-5 AND C5-6 APPEAR STABLE.  THE INTERBODY FUSION PLUGS REMAIN IN GOOD POSITION WITH NORMAL HEIGHT AND THE ANTERIOR METALLIC FIXATION PLATE IS UNCHANGED.  NORMAL ALIGNMENT IS MAINTAINED. SOLID FUSION IS NOTED AT C6-7.  ON THE OBLIQUE VIEWS THE FORAMINA ARE WIDELY PATIENT.  THE ODONTOID PROCESS IS INTACT. IMPRESSION 1.  STABLE ANTERIOR FUSION AT C4-5 AND C5-6 WITH NORMAL ALIGNMENT.  FORAMINA ARE WIDELY PATENT. 2.  SOLID FUSION AT C6-7.   Shoulder  Imaging: Shoulder-R DG:  Results for orders placed in visit on 01/26/11  DG Shoulder Right   Narrative * PRIOR REPORT IMPORTED FROM AN EXTERNAL SYSTEM *   PRIOR REPORT IMPORTED FROM THE [facility] SYSTEM   REASON FOR EXAM:    pain  COMMENTS:   PROCEDURE:     DXR - DXR SHOULDER RIGHT COMPLETE  - Jan 27 2011  3:09AM   RESULT:     Comparison: None.   Findings:  No  acute fracture or dislocation. Post surgical changes seen from prior  distal clavicle resection. Small round lucency overlying the superior  lateral aspect of the humeral head likely secondary to subcortical cystic  change at the insertion of the supraspinatus.   IMPRESSION:  No acute fracture or dislocation.       Thoracic Imaging: Thoracic MR wo contrast:  Results for orders placed during the hospital encounter of 11/27/05  MR Thoracic Spine Wo Contrast   Narrative Clinical data: Severe back pain. Myelopathy.   MRI OF THORACIC SPINE WITHOUT CONTRAST:  Technique: Multiplanar and multiecho pulse sequences of the thoracic spine were obtained according to standard protocol without IV contrast.  Findings: Counting of the levels is difficult in this case due to cervical fusion and difficulty counting the cervical vertebrae. I believe I have the numbering accurate; however, it could be off by a level.   There is scoliosis convex to the right in the lower thoracic spine and convex to the left in the upper thoracic spine. There is no fracture. Hemangioma is noted in the T9 vertebral body. There is partial fusion of the T11 and T12 vertebra on the left which is probably due to disc degeneration. No fracture or mass lesion is identified  T2-3: Mild disc degeneration.   T3-4: Negative.   T4-5: Right sided osteophyte and disc are present with flattening of the cord on the right.   T5-6: Negative.   T6-7: Central disc protrusion is present with mild flattening of the ventral cord in the midline.   T7-8: Small left sided disc  protrusion with flattening of the left side of the cord.   T8-9: Small left sided disc protrusion.   T9-10: Small left sided disc protrusion without significant cord deformity.   T10-11: Right sided osteophyte is present with mild flattening of the cord.   T11-12: Partial interbody fusion which is probably degenerative.  T12-L1: Small central and right sided disc protrusion without stenosis.   L1-2: Central disc protrusion with some associated spurring.   IMPRESSION:  1. Multilevel disc degeneration and spondylosis. Multiple small disc protrusions are present. There is no fracture or mass.   2. Scoliosis.  Provider: Sedonia Small   Thoracic MR w/wo contrast:  Results for orders placed in visit on 09/13/01  MR Thoracic Spine W Wo Contrast   Narrative FINDINGS CLINICAL DATA:  PAIN. CERVICAL SPINE, PLAIN FILMS, SEVEN VIEWS OF THE CERVICAL SPINE LATERAL VIEW OF THE CERVICAL SPINE DEMONSTRATES PREVIOUS FUSION FROM C4 THOUGH C7.  THERE IS A SOLID INTERBODY FUSION AT C6-7.   THERE HAS  BEEN PREVIOUS INTERBODY FUSION WITH ANTERIOR PLATING FROM C4 THROUGH C6. FLEXION AND EXTENSION SHOWS NO ABNORMAL MOVEMENT MEASURED FROM THE SPINOUS PROCESS OF C7 TO THE SPINOUS PROCESS OF C4. AP VIEWS SHOW THE HARDWARE CENTERED IN THE MIDLINE AND GROSSLY INTACT. OBLIQUE VIEWS SHOW SOME FORAMINAL NARROWING AT C6-7 ON THE LEFT. IMPRESSION SOLID FUSION, C4-C7. BONY FORAMINAL NARROWING, C6-7,  LEFT. NO ABNORMAL MOVEMENT BETWEEN FLEXION AND EXTENSION. MRI CERVICAL SPINE MULTIPLANAR T1- AND T2-WEIGHTED IMAGES ARE OBTAINED PRE AND POST CONTRAST.  THERE HAS BEEN PREVIOUS FUSION FROM C4 THROUGH C7 WITH ANTERIOR PLATING AT C4-5-6.  THE ALIGNMENT IS SATISFACTORY. THERE IS A BROAD-BASED CENTRAL DISC PROTRUSION AT C3-4 WITH EFFACEMENT OF THE ANTERIOR SUBARACHNOID SPACE BUT NO CORD COMPRESSION.  THERE IS NO ABNORMAL CORD SIGNAL SEEN AT C3-4.  NO C4 NERVE ROOT ENCROACHMENT IS EVIDENT. THERE IS CONSIDERABLE ARTIFACTUAL SIGNAL  LOSS AT C4 AND BELOW.  THERE DOES  APPEAR TO BE BONY FORAMINAL NARROWING AT C6-7 ON THE LEFT AS IDENTIFIED FROM PLAIN FILMS.  WHEN COMPARED WITH PREVIOUS MRI OF THE CERVICAL SPINE FROM 06/04/01, THE DISC PROTRUSION AT C3-4 IS CONSIDERABLY LARGER. FOLLOWING ADMINISTRATION OF CONTRAST, THERE IS NO ABNORMAL ENHANCEMENT. IMPRESSION STATUS POST FUSION, C4-C7. CENTRAL HNP, C3-4 WITH EFFACEMENT OF THE ANTERIOR SUBARACHNOID SPACE BUT NO CORD COMPRESSION. BONY FORAMINAL NARROWING AT C6-7, LEFT. MRI THORACIC SPINE MULTIPLANAR T1- AND T2-WEIGHTED IMAGES ARE OBTAINED PRE AND POST CONTRAST.  THERE IS ADVANCED DEGENERATIVE DISC DISEASE AT T11-12 WHERE THERE IS NEAR COMPLETE LOSS OF INTERSPACE HEIGHT. SIMILAR LESS SEVERE CHANGES ARE SEEN AT T9-10 AND T10-11.  VERTEBRAL BODY ALIGNMENT IS SATISFACTORY, ALTHOUGH THERE IS MILD KYPHOTIC ANGULATION AT T11-12 DUE TO MILD COMPRESSION OF THE T12 VERTEBRA.  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 AT T9-10, THERE IS A FAIRLY PROMINENT DISC PROTRUSION, CENTRAL AND TO THE LEFT.  THERE IS EFFACEMENT OF THE ANTERIOR SUBARACHNOID SPACE BUT NO DEFINITE CORD FLATTENING.  AT T8-9, THERE IS A SMALL CENTRAL PROTRUSION. WHEN COMPARED WITH THE PREVIOUS STUDY FROM 09/03/99, THERE IS LITTLE CHANGE. FOLLOWING ADMINISTRATION OF CONTRAST, THERE IS NO ABNORMAL ENHANCEMENT OF THE THORACIC SPINE OR SURROUNDING STRUCTURES. 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 MODERATE SIZE DISC PROTRUSION, T9-10  CENTRAL AND TO THE LEFT ALONG WITH A SMALLER [place] PROTRUSION AT T8-9 CENTRALLY. THERE IS NO SIGNIFICANT INTERVAL CHANGE WHEN COMPARED WITH 09/03/99. MRI LUMBAR SPINE MULTIPLANAR T1- AND T2-WEIGHTED IMAGES ARE OBTAINED PRE AND POST CONTRAST. THE PATIENT HAS UNDERGONE PREVIOUS PLACEMENT OF THREADED INTERBODY CAGES AT L3-4.  THE CAGES APPEAR TO BE IN  GOOD POSITION.  THE ALIGNMENT IS SATISFACTORY.  THERE IS ENHANCEMENT OF THE END PLATES ABOVE AND BELOW, L3-4 AN EXPECTED POST-OP FINDING.  THERE IS ADVANCED DEGENERATIVE CHANGES AT L2-3 WITH ENHANCEMENT OF END PLATES ABOVE AND BELOW L2-3, REFLECTING ACTIVE ONGOING  DEGENERATIVE CHANGE.  THE CONUS MEDULLARIS IS WITHIN NORMAL LIMITS AND THE INDIVIDUAL DISC SPACES ARE EXAMINED AS FOLLOWS. L1-2:  MILD CENTRAL PROTRUSION. NO ROOT CUTOFF OR STENOSIS. L2-3:  BROAD-BASED DISC PROTRUSION, CENTRAL AND TO THE LEFT.  LEFT L2 AND L3 NERVE ROOT ENCROACHMENT IS PRESENT. L3-4:  SATISFACTORY POST-OPERATIVE APPEARANCE. L4-5:  SMALL CENTRAL PROTRUSION.  NO ROOT CUTOFF OR SPINAL STENOSIS. MARKED POSTERIOR ELEMENT HYPERTROPHY IS SEEN. L5-S1:  MARKED POSTERIOR ELEMENT HYPERTROPHY. NO STENOSIS OR DISC PROTRUSION. COMPARED WITH 04/08/00, THE DISC PROTRUSIONS AT L1-2 AND L2-3 HAVE DECREASED SOMEWHAT IN SIZE.   IN ADDITION THE DISC PROTRUSION AT L4-5 HAS ALSO DECREASED IN SIZE. IMPRESSION MODERATE DISC PROTRUSION, L2-3, CENTRAL AND TO THE LEFT WITH LEFT L2 AND LEFT L3 NERVE ROOT ENCROACHMENT. SMALLER CENTRAL PROTRUSIONS OF L1-2 AND L4-5. STATUS POST THREADED INTERBODY CAGES, L3-4. COMPARED WITH THE PREVIOUS STUDY FROM 04/08/00, THE DISC PROTRUSIONS HAVE ALL DECREASED SLIGHTLY IN SIZE.  Provider: Margaretmary Eddy   Thoracic CT w contrast:  Results for orders placed during the hospital encounter of 11/09/07  CT Thoracic Spine W Contrast   Narrative Clinical Data:  Neck pain, mid back pain, low back pain   MYELOGRAM CERVICAL AND THORACIC AND LUMBAR   CT MYELOGRAM CERVICAL AND THORACIC AND LUMBAR   Technique: Injection was performed by the neurosurgeon at L4.  10 ml of Omnipaque-300 was instilled in the subarachnoid space 322 gauge spinal needle.Following injection of intrathecal Omnipaque contrast, spine imaging in multiple projections  was performed using fluoroscopy.   Comparison: Lumbar MRI 07/21/2007    Findings: There is a previous Ray cage fusion at L3-4.  There is mild stenosis at L4-5 with effacement of both L5 nerve roots, right worse than left.  There is moderate scoliosis with bony overgrowth noted, particularly in the lower thoracic region where there is been previous fusion.  Because of the scoliosis, and the lumbar degenerative change, it was difficult to maneuver the contrast into the thoracic and cervical regions for myelography.  The patient is transferred CT scan for CT myelogram evaluation   Fluoroscopy Time: 3.04 minutes   IMPRESSION: As above   CT MYELOGRAPHY CERVICAL SPINE   Technique:  CT imaging of the cervical spine was performed after intrathecal contrast administration. Multiplanar CT image reconstructions were also generated.   Comparison: No prior MRI or CT scans available of the neck   Findings:  Previous cervical fusion with instrumentation.  There appears to be continuous fusion from C4-T1.  Alignment is anatomic except for mild anterolisthesis of C2 forward on C3 of approximate 2 mm.   C2-3: Mild anterolisthesis.  Mild annular bulging.  No stenosis or nerve root encroachment.   C3-4: Advanced disk space narrowing  with central osteophyte formation and vacuum disc phenomenon.  No frank disc protrusion, and no foraminal narrowing.   C4-5: Solid fusion, no residual stenosis.   C5-6: Solid fusion, no residual stenosis.   C6-7: Solid fusion.  Mild residual uncinate spurring and neural foraminal narrowing on the left with associated mild facet arthropathy.  Equivocal left  C7 nerve root encroachment is present.   C7-T1: solid fusion.  Hardware intact.  No residual stenosis or nerve root encroachment   IMPRESSION: Satisfactory post fusion appearance, with mild degenerative change at C3-4, C2-3.   No significant residual compression, hardware failure, or adjacent segment disease.   CT MYELOGRAPHY THORACIC SPINE   Technique: CT imaging of  the thoracic spine was performed after intrathecal contrast administration.  Multiplanar CT image reconstructions were also generated.   Comparison: MRI 11/27/2005   Findings:  Mild scoliosis is present convex left in the upper thoracic region of approximate 20 degrees.  Previous fusion at T10- 11 and T11-12, apparently spontaneous.  Endplate sclerotic change is present, but no destructive lesions are seen and there is no subluxation at any level.   The individual disc spaces are examined as follows:    T1-2: Normal.   T2-3:  Shallow central and leftward protrusion.  Possible right T2 nerve root encroachment   T3-4:  Normal.   T4-5:  Shallow right protrusion, mildly flattening the cord in conjunction with scoliosis.  Right T4 nerve root encroachment may be present as well   T5-6:  Normal.   T6-7:  Calcified central protrusion, mildly flattening the cord and subarachnoid space.  No signet spinal stenosis however.   T7-8:  Shallow leftward protrusion, minimally flattening the cord. No compression of the exiting nerve root.   T8-9:  Tiny leftward protrusion, non compressive   T9-10:  Moderate sized soft disc protrusion centrally with disc space narrowing and vacuum disc phenomenon.  This does not compress the cord resolved spinal stenosis.   T10-11:  Tiny calcified protrusion, non compressive.   T11-12:  Solid fusion.  No disc pathology   Compared with prior MR there is little change.   IMPRESSION: Multiple thoracic disc protrusions as described, none of which appear result in significant cord compression or represent a significant interval change from 2007.  CT MYELOGRAPHY LUMBAR SPINE   Technique: CT imaging of the lumbar spine was performed after intrathecal contrast administration.  Multiplanar CT image reconstructions were also generated.   Comparison: Lumbar MRI 07/21/2007   Findings:  No prevertebral or paraspinous mass.  Minimal atherosclerotic  calcification.   L1-2: Advanced disc space narrowing and shallow central protrusion. Asymmetric lateral recess encroachment left due to  the scoliotic deformity.  Left L2 nerve root encroachment suspected.  No stenosis of the canal. Left foraminal narrowing compresses the L1 nerve root   L2-3: Central and leftward protrusion associated with mild scoliosis.  Left L3 nerve root encroachment present. Left L2 nerve root compression is seen in the foramen.   L3-4: Satisfactory Ray cage fusion appears solid.  No residual compression.  Wide hemilaminectomy defect, left and right.   L4-5: Shallow central protrusion with posterior element hypertrophy.  Asymmetric foraminal narrowing on the right due to uncinate spurring.  Mild right L4 and right L5 nerve root encroachment are present.   L5-S1: Mild bulge.  Moderate facet arthropathy.  No compression.   There appears to be little change compared to prior MR.   IMPRESSION: Central and leftward protrusions at L1-2 and L2-3 with severe disc space narrowing and facet overgrowth; left L2 and left L3 encroachment observed; there is also asymmetric neural foraminal narrowing at these levels.   Solid fusion L3-4  Provider: Bonna Gains   Thoracic DG 2-3 views:  Results for orders placed in visit on 01/26/11  DG Thoracic Spine 2 View   Narrative * PRIOR REPORT IMPORTED FROM AN EXTERNAL SYSTEM *   PRIOR REPORT IMPORTED FROM THE [facility] [place] EXAM:    pain  COMMENTS:   May transport without cardiac monitor   PROCEDURE:     DXR - DXR THORACIC  AP AND LATERAL  - Jan 27 2011  3:09AM   RESULT:     Comparison: Chest radiographs 10/19/2004   Findings:  There is S-shaped scoliosis of the thoracolumbar spine. Evaluation of the  upper thoracic spine is slight limited on the lateral view secondary to  overlying structures. Mild anterior height loss of the vertebral bodies in  the lower thoracic spine and upper lumbar spine are  similar to prior chest  radiograph. Minimal anterior height loss of a vertebral body in the  midthoracic spine is also unchanged.   IMPRESSION:  Scoliosis. No acute changes seen. If there is continued clinical concern,  consider MRI for further evaluation.       Lumbosacral Imaging: Lumbar MR w/wo contrast:  Results for orders placed in visit on 12/21/08  MR Lumbar Spine W Wo Contrast   Narrative * PRIOR REPORT IMPORTED FROM AN EXTERNAL SYSTEM *   PRIOR REPORT IMPORTED FROM THE [facility] WORKFLOW SYSTEM   REASON FOR EXAM:    low back pain  COMMENTS:   PROCEDURE:     MR  - MR LUMBAR SPINE WO/W  - Dec 21 2008  1:59PM   RESULT:     Sagittal and axial T1 pre- and postgadolinium images as well  as  T2 weighted images were obtained through the lumbar spine.   There is metallic susceptibility artifact at the level of the L3-L4 disc  compatible with placement of an intradiscal device in the past. The  patient  has apparently undergone prior laminectomy in the lumbar spine in the  past.  The lumbar vertebral bodies are preserved in height. In the body of L2  there  is a well-circumscribed 1.5 cm diameter structure most compatible with a  hemangioma. There is no evidence of bone marrow edema. The conus medullary  is appears to terminate posterior to the body of approximately L1. Axial  imaging was performed from the mid body of T12 to the mid body of S1.   At T12-L1 there is an asymmetric endplate bar toward the right. In the  midline the AP dimension the thecal sac remains 15 mm and the neural  foramina are patent.    At L1-L2 there is annular disc bulging with endplate bar formation. The  AP  dimension the thecal sac is 14 mm and the neural foramina are patent.    At L2-L3 there is annular disc bulging. The AP dimension of the thecal  sac  measures approximately 15 mm and the neural foramina are patent. Mild  encroachment on the left is present here.   At L3-L4 the AP  dimension of the thecal sac measures nearly 2 cm and the  neural foramina are patent.   At L4-L5 there is mild annular disc bulging. The AP dimension of the  thecal  sac measures roughly 13 mm there is mild encroachment upon the left neural  foramen.    At L5-S1 the AP dimension of the CSF filled thecal sac is approximately  14  mm and the neural foramina are patent. Following gadolinium enhancement I  do  not see significant abnormal enhancement to suggest scarring.   IMPRESSION:      There is multilevel degenerative disc disease without  high-grade spinal stenosis as described above. There are variable degrees  of  mild neural foraminal encroachment seen at several levels. There is no  discrete focal HNP. I do not see abnormal enhancement to suggest scarring.       Lumbar CT w contrast:  Results for orders placed during the hospital encounter of 11/09/07  CT Lumbar Spine W Contrast   Narrative Clinical Data:  Neck pain, mid back pain, low back pain   MYELOGRAM CERVICAL AND THORACIC AND LUMBAR   CT MYELOGRAM CERVICAL AND THORACIC AND LUMBAR   Technique: Injection was performed by the neurosurgeon at L4.  10 ml of Omnipaque-300 was instilled in the subarachnoid space 322 gauge spinal needle.Following injection of intrathecal Omnipaque contrast, spine imaging in multiple projections was performed using fluoroscopy.   Comparison: Lumbar MRI 07/21/2007   Findings: There is a previous Ray cage fusion at L3-4.  There is mild stenosis at L4-5 with effacement of both L5 nerve roots, right worse than left.  There is moderate scoliosis with bony overgrowth noted, particularly in the lower thoracic region where there is been previous fusion.  Because of the scoliosis, and the lumbar degenerative change, it was difficult to maneuver the contrast into the thoracic and cervical regions for myelography.  The patient is transferred CT scan for CT myelogram evaluation   Fluoroscopy  Time: 3.04 minutes   IMPRESSION: As above   CT MYELOGRAPHY CERVICAL SPINE   Technique:  CT imaging of the cervical spine was performed after intrathecal contrast administration. Multiplanar CT image reconstructions were also generated.   Comparison: No prior MRI or CT scans available of the neck   Findings:  Previous cervical fusion with instrumentation.  There appears to be continuous fusion from C4-T1.  Alignment is anatomic except for mild anterolisthesis of C2 forward on C3 of approximate 2 mm.   C2-3: Mild anterolisthesis.  Mild annular bulging.  No stenosis or nerve root  encroachment.   C3-4: Advanced disk space narrowing  with central osteophyte formation and vacuum disc phenomenon.  No frank disc protrusion, and no foraminal narrowing.   C4-5: Solid fusion, no residual stenosis.   C5-6: Solid fusion, no residual stenosis.   C6-7: Solid fusion.  Mild residual uncinate spurring and neural foraminal narrowing on the left with associated mild facet arthropathy.  Equivocal left  C7 nerve root encroachment is present.   C7-T1: solid fusion.  Hardware intact.  No residual stenosis or nerve root encroachment   IMPRESSION: Satisfactory post fusion appearance, with mild degenerative change at C3-4, C2-3.   No significant residual compression, hardware failure, or adjacent segment disease.   CT MYELOGRAPHY THORACIC SPINE   Technique: CT imaging of the thoracic spine was performed after intrathecal contrast administration.  Multiplanar CT image reconstructions were also generated.   Comparison: MRI 11/27/2005   Findings:  Mild scoliosis is present convex left in the upper thoracic region of approximate 20 degrees.  Previous fusion at T10- 11 and T11-12, apparently spontaneous.  Endplate sclerotic change is present, but no destructive lesions are seen and there is no subluxation at any level.   The individual disc spaces are examined as follows:    T1-2:  Normal.   T2-3:  Shallow central and leftward protrusion.  Possible right T2 nerve root encroachment   T3-4:  Normal.   T4-5:  Shallow right protrusion, mildly flattening the cord in conjunction with scoliosis.  Right T4 nerve root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

## 2016-02-24 LAB — COMPLIANCE DRUG ANALYSIS, UR: PDF: 0

## 2016-02-26 ENCOUNTER — Telehealth: Payer: Self-pay

## 2016-02-26 NOTE — Telephone Encounter (Signed)
Scheduling called and wants to know if Dr. Dossie Arbour wanted pt to go to heart clinic for EKG I let her know I would check with you all. After talking to Angie I learned the heart clinic does not take walk-ins for EKG please find out information and I will call scheduling. He would need to change where EKG is to be done. The scheduler name is Bethena Roys

## 2016-03-01 ENCOUNTER — Ambulatory Visit: Payer: Medicare Other

## 2016-03-01 ENCOUNTER — Telehealth: Payer: Self-pay | Admitting: *Deleted

## 2016-03-01 NOTE — Telephone Encounter (Signed)
Verified with Dr. Dossie Arbour where he wanted the patient to have the EKG done. Original order stated heart clinic; which was an error. Patient is to have it done at [facility] cardiopulmonary department. Patient was contacted via telephone and instructed that the EKG was ordered and patient is to go to the [facility] registration at the medical mall to register and then to cardiopulmonary department to have the test done.

## 2016-03-01 NOTE — Telephone Encounter (Signed)
Verified with Dr. Dossie Arbour where he wanted the patient to have the EKG done. Original order stated heart clinic; which was an error. Patient is to have it done at [facility] cardiopulmonary department. Patient was contacted via telephone and instructed that the EKG was ordered and patient is to go to the [facility] registration at the medical mall to register and then to cardiopulmonary department to have the test done.

## 2016-03-11 ENCOUNTER — Ambulatory Visit
Admission: RE | Admit: 2016-03-11 | Discharge: 2016-03-11 | Disposition: A | Discharge status: Home / Self Care | Payer: Medicare Other | Source: Ambulatory Visit | Attending: Pain Medicine | Admitting: Pain Medicine

## 2016-03-11 ENCOUNTER — Other Ambulatory Visit
Admission: RE | Admit: 2016-03-11 | Discharge: 2016-03-11 | Disposition: A | Discharge status: Home / Self Care | Payer: Medicare Other | Source: Ambulatory Visit | Attending: Pain Medicine | Admitting: Pain Medicine

## 2016-03-11 ENCOUNTER — Encounter: Payer: Self-pay | Admitting: Radiology

## 2016-03-11 DIAGNOSIS — M47816 Spondylosis without myelopathy or radiculopathy, lumbar region: Secondary | ICD-10-CM

## 2016-03-11 DIAGNOSIS — G8929 Other chronic pain: Secondary | ICD-10-CM

## 2016-03-11 DIAGNOSIS — M5136 Other intervertebral disc degeneration, lumbar region: Secondary | ICD-10-CM | POA: Diagnosis not present

## 2016-03-11 DIAGNOSIS — M961 Postlaminectomy syndrome, not elsewhere classified: Secondary | ICD-10-CM

## 2016-03-11 DIAGNOSIS — M5416 Radiculopathy, lumbar region: Secondary | ICD-10-CM | POA: Insufficient documentation

## 2016-03-11 DIAGNOSIS — M542 Cervicalgia: Secondary | ICD-10-CM | POA: Insufficient documentation

## 2016-03-11 DIAGNOSIS — M545 Low back pain, unspecified: Secondary | ICD-10-CM

## 2016-03-11 DIAGNOSIS — M546 Pain in thoracic spine: Secondary | ICD-10-CM | POA: Insufficient documentation

## 2016-03-11 DIAGNOSIS — M539 Dorsopathy, unspecified: Secondary | ICD-10-CM | POA: Diagnosis not present

## 2016-03-11 DIAGNOSIS — M5126 Other intervertebral disc displacement, lumbar region: Secondary | ICD-10-CM | POA: Diagnosis not present

## 2016-03-11 DIAGNOSIS — M4856XA Collapsed vertebra, not elsewhere classified, lumbar region, initial encounter for fracture: Secondary | ICD-10-CM | POA: Diagnosis not present

## 2016-03-11 DIAGNOSIS — Z981 Arthrodesis status: Secondary | ICD-10-CM | POA: Insufficient documentation

## 2016-03-11 DIAGNOSIS — M419 Scoliosis, unspecified: Secondary | ICD-10-CM | POA: Diagnosis not present

## 2016-03-11 DIAGNOSIS — I700 Atherosclerosis of aorta: Secondary | ICD-10-CM | POA: Diagnosis not present

## 2016-03-11 DIAGNOSIS — M549 Dorsalgia, unspecified: Secondary | ICD-10-CM

## 2016-03-11 LAB — COMPREHENSIVE METABOLIC PANEL
ALT: 12 U/L — AB (ref 14–54)
AST: 16 U/L (ref 15–41)
Albumin: 4.2 g/dL (ref 3.5–5.0)
Alkaline Phosphatase: 68 U/L (ref 38–126)
Anion gap: 5 (ref 5–15)
BUN: 20 mg/dL (ref 6–20)
CHLORIDE: 105 mmol/L (ref 101–111)
CO2: 30 mmol/L (ref 22–32)
CREATININE: 0.61 mg/dL (ref 0.44–1.00)
Calcium: 9.4 mg/dL (ref 8.9–10.3)
GFR calc non Af Amer: >60 mL/min (ref >60)
Glucose, Bld: 84 mg/dL (ref 65–99)
Potassium: 4.2 mmol/L (ref 3.5–5.1)
SODIUM: 140 mmol/L (ref 135–145)
Total Bilirubin: 0.5 mg/dL (ref 0.3–1.2)
Total Protein: 7.3 g/dL (ref 6.5–8.1)

## 2016-03-11 LAB — MAGNESIUM: Magnesium: 1.9 mg/dL (ref 1.7–2.4)

## 2016-03-11 LAB — VITAMIN B12: Vitamin B-12: >7500 pg/mL — ABNORMAL HIGH (ref 180–914)

## 2016-03-11 LAB — C-REACTIVE PROTEIN

## 2016-03-11 LAB — SEDIMENTATION RATE: Sed Rate: 11 mm/hr (ref 0–30)

## 2016-03-15 LAB — 25-HYDROXY VITAMIN D LCMS D2+D3
25-Hydroxy, Vitamin D-2: 1.1 ng/mL
25-Hydroxy, Vitamin D-3: 32 ng/mL
25-Hydroxy, Vitamin D: 33 ng/mL

## 2016-03-21 NOTE — Progress Notes (Signed)
Results were reviewed and found to be: mildly abnormal    Review would suggest interventional pain management techniques may be of benefit  No acute injury or pathology identified

## 2016-03-21 NOTE — Progress Notes (Signed)
While most low ALT level results indicate a normal healthy liver, that may not always be the case. A low-functioning or non-functioning liver, lacking normal levels of ALT activity to begin with, would not release a lot of ALT into the blood when damaged. People infected with the hepatitis C virus initially show high ALT levels in their blood, but these levels fall over time. Because the ALT test measures ALT levels at only one point in time, people with chronic hepatitis C infection may already have experienced the ALT peak well before blood was drawn for the ALT test. Urinary tract infections or malnutrition may also cause low blood ALT levels.

## 2016-03-21 NOTE — Progress Notes (Signed)
Results were reviewed and found to be: mildly abnormal    Review would suggest interventional pain management techniques may be of benefit  Further testing may be useful

## 2016-03-21 NOTE — Progress Notes (Signed)
Normal Vitamin B-12 levels are between 211 and 946 pg/mL, for our Lab. Medical conditions that can increase levels of vitamin B12 include liver disease, kidney failure and myeloproliferative disorders, which includes myelocytic leukemia and polycythemia vera.

## 2016-03-22 ENCOUNTER — Encounter: Payer: Self-pay | Admitting: *Deleted

## 2016-03-22 NOTE — Pre-Procedure Instructions (Signed)
MRSA 1990'S,SPOKE WITH PATIENT AND COMING 03/26/16 FOR MRSA SWAB

## 2016-03-26 ENCOUNTER — Encounter
Admission: RE | Admit: 2016-03-26 | Discharge: 2016-03-26 | Disposition: A | Discharge status: Home / Self Care | Payer: Medicare Other | Source: Ambulatory Visit | Attending: Ophthalmology | Admitting: Ophthalmology

## 2016-03-26 DIAGNOSIS — H2511 Age-related nuclear cataract, right eye: Secondary | ICD-10-CM | POA: Insufficient documentation

## 2016-03-26 LAB — SURGICAL PCR SCREEN
MRSA, PCR: NEGATIVE
Staphylococcus aureus: NEGATIVE

## 2016-03-30 ENCOUNTER — Encounter: Payer: Self-pay | Admitting: *Deleted

## 2016-03-30 ENCOUNTER — Ambulatory Visit
Admission: RE | Admit: 2016-03-30 | Discharge: 2016-03-30 | Disposition: A | Discharge status: Home / Self Care | Payer: Medicare Other | Source: Ambulatory Visit | Attending: Ophthalmology | Admitting: Ophthalmology

## 2016-03-30 ENCOUNTER — Encounter
Admission: RE | Disposition: A | Discharge status: Home / Self Care | Payer: Self-pay | Source: Ambulatory Visit | Attending: Ophthalmology

## 2016-03-30 ENCOUNTER — Ambulatory Visit: Payer: Medicare Other | Admitting: Anesthesiology

## 2016-03-30 DIAGNOSIS — H2512 Age-related nuclear cataract, left eye: Secondary | ICD-10-CM | POA: Diagnosis present

## 2016-03-30 DIAGNOSIS — J449 Chronic obstructive pulmonary disease, unspecified: Secondary | ICD-10-CM | POA: Diagnosis not present

## 2016-03-30 DIAGNOSIS — I119 Hypertensive heart disease without heart failure: Secondary | ICD-10-CM | POA: Insufficient documentation

## 2016-03-30 DIAGNOSIS — Z87891 Personal history of nicotine dependence: Secondary | ICD-10-CM | POA: Insufficient documentation

## 2016-03-30 DIAGNOSIS — I2510 Atherosclerotic heart disease of native coronary artery without angina pectoris: Secondary | ICD-10-CM | POA: Insufficient documentation

## 2016-03-30 DIAGNOSIS — Z955 Presence of coronary angioplasty implant and graft: Secondary | ICD-10-CM | POA: Diagnosis not present

## 2016-03-30 HISTORY — DX: Atherosclerotic heart disease of native coronary artery without angina pectoris: I25.10

## 2016-03-30 HISTORY — DX: Pain, unspecified: R52

## 2016-03-30 HISTORY — DX: Major depressive disorder, single episode, unspecified: F32.9

## 2016-03-30 HISTORY — DX: Depression, unspecified: F32.A

## 2016-03-30 HISTORY — DX: Reserved for concepts with insufficient information to code with codable children: IMO0002

## 2016-03-30 HISTORY — DX: Unspecified convulsions: R56.9

## 2016-03-30 HISTORY — DX: Gastro-esophageal reflux disease without esophagitis: K21.9

## 2016-03-30 HISTORY — DX: Hypothyroidism, unspecified: E03.9

## 2016-03-30 HISTORY — DX: Systemic lupus erythematosus, unspecified: M32.9

## 2016-03-30 HISTORY — DX: Dorsopathy, unspecified: M53.9

## 2016-03-30 HISTORY — PX: CATARACT EXTRACTION W/PHACO: SHX586

## 2016-03-30 HISTORY — DX: Carrier or suspected carrier of methicillin resistant Staphylococcus aureus: Z22.322

## 2016-03-30 HISTORY — DX: Unspecified hearing loss, unspecified ear: H91.90

## 2016-03-30 HISTORY — DX: Cardiomegaly: I51.7

## 2016-03-30 SURGERY — PHACOEMULSIFICATION, CATARACT, WITH IOL INSERTION
Anesthesia: Monitor Anesthesia Care | Site: Eye | Laterality: Left | Wound class: Clean

## 2016-03-30 MED ORDER — CEFUROXIME OPHTHALMIC INJECTION 1 MG/0.1 ML
INJECTION | OPHTHALMIC | Status: AC
  Filled 2016-03-30: qty 0.1

## 2016-03-30 MED ORDER — ARMC OPHTHALMIC DILATING GEL
1.0000 | OPHTHALMIC | Status: AC | PRN
Start: 2016-03-30 — End: 2016-03-30
  Administered 2016-03-30 (×2): 1 via OPHTHALMIC

## 2016-03-30 MED ORDER — FENTANYL CITRATE (PF) 100 MCG/2ML IJ SOLN
INTRAMUSCULAR | Status: DC | PRN
  Administered 2016-03-30: 50 ug via INTRAVENOUS

## 2016-03-30 MED ORDER — EPINEPHRINE HCL 1 MG/ML IJ SOLN
INTRAMUSCULAR | Status: AC
  Filled 2016-03-30: qty 1

## 2016-03-30 MED ORDER — TETRACAINE HCL 0.5 % OP SOLN
1.0000 [drp] | Freq: Once | OPHTHALMIC | Status: AC
  Administered 2016-03-30: 1 [drp] via OPHTHALMIC

## 2016-03-30 MED ORDER — POVIDONE-IODINE 5 % OP SOLN
1.0000 "application " | Freq: Once | OPHTHALMIC | Status: AC
  Administered 2016-03-30: 1 via OPHTHALMIC

## 2016-03-30 MED ORDER — NA CHONDROIT SULF-NA HYALURON 40-17 MG/ML IO SOLN
INTRAOCULAR | Status: DC | PRN
  Administered 2016-03-30: 1 mL via INTRAOCULAR

## 2016-03-30 MED ORDER — NA CHONDROIT SULF-NA HYALURON 40-17 MG/ML IO SOLN
INTRAOCULAR | Status: AC
  Filled 2016-03-30: qty 1

## 2016-03-30 MED ORDER — MOXIFLOXACIN HCL 0.5 % OP SOLN: 1.0000 [drp] | OPHTHALMIC | Status: AC | PRN

## 2016-03-30 MED ORDER — SODIUM CHLORIDE 0.9 % IV SOLN
INTRAVENOUS | Status: DC
  Administered 2016-03-30 (×2): via INTRAVENOUS

## 2016-03-30 MED ORDER — MIDAZOLAM HCL 2 MG/2ML IJ SOLN
INTRAMUSCULAR | Status: DC | PRN
  Administered 2016-03-30 (×2): 1 mg via INTRAVENOUS

## 2016-03-30 MED ORDER — CARBACHOL 0.01 % IO SOLN
INTRAOCULAR | Status: DC | PRN
  Administered 2016-03-30: .5 mL via INTRAOCULAR

## 2016-03-30 MED ORDER — BSS IO SOLN
INTRAOCULAR | Status: DC | PRN
  Administered 2016-03-30: 1 mL via OPHTHALMIC

## 2016-03-30 MED ORDER — MOXIFLOXACIN HCL 0.5 % OP SOLN
OPHTHALMIC | Status: DC | PRN
  Administered 2016-03-30: 1 [drp] via OPHTHALMIC

## 2016-03-30 SURGICAL SUPPLY — 22 items
CANNULA ANT/CHMB 27G (MISCELLANEOUS) ×1 IMPLANT
CANNULA ANT/CHMB 27GA (MISCELLANEOUS) ×2 IMPLANT
CUP MEDICINE 2OZ PLAST GRAD ST (MISCELLANEOUS) ×2 IMPLANT
GLOVE BIO SURGEON STRL SZ8 (GLOVE) ×2 IMPLANT
GLOVE BIOGEL M 6.5 STRL (GLOVE) ×2 IMPLANT
GLOVE SURG LX 8.0 MICRO (GLOVE) ×1
GLOVE SURG LX STRL 8.0 MICRO (GLOVE) ×1 IMPLANT
GOWN STRL REUS W/ TWL LRG LVL3 (GOWN DISPOSABLE) ×2 IMPLANT
GOWN STRL REUS W/TWL LRG LVL3 (GOWN DISPOSABLE) ×4
LENS IOL TECNIS ITEC 25.0 (Intraocular Lens) ×1 IMPLANT
PACK CATARACT (MISCELLANEOUS) ×2 IMPLANT
PACK CATARACT BRASINGTON LX (MISCELLANEOUS) ×2 IMPLANT
PACK EYE AFTER SURG (MISCELLANEOUS) ×2 IMPLANT
SOL BSS BAG (MISCELLANEOUS) ×2
SOL PREP PVP 2OZ (MISCELLANEOUS) ×2
SOLUTION BSS BAG (MISCELLANEOUS) ×1 IMPLANT
SOLUTION PREP PVP 2OZ (MISCELLANEOUS) ×1 IMPLANT
SYR 3ML LL SCALE MARK (SYRINGE) ×2 IMPLANT
SYR 5ML LL (SYRINGE) ×2 IMPLANT
SYR TB 1ML 27GX1/2 LL (SYRINGE) ×2 IMPLANT
WATER STERILE IRR 250ML POUR (IV SOLUTION) ×2 IMPLANT
WIPE NON LINTING 3.25X3.25 (MISCELLANEOUS) ×2 IMPLANT

## 2016-03-30 NOTE — Transfer of Care (Signed)
Immediate Anesthesia Transfer of Care Note  Patient: Diane Adkins  Procedure(s) Performed: Procedure(s) with comments: CATARACT EXTRACTION PHACO AND INTRAOCULAR LENS PLACEMENT (IOC) (Left) - [place] 00:45 AP% 19.6 CDE 8.93 Fluid pack lot # [id number]  Patient Location: PACU  Anesthesia Type:MAC  Level of Consciousness: awake  Airway & Oxygen Therapy: Patient Spontanous Breathing  Post-op Assessment: Report given to RN and Post -op Vital signs reviewed and stable  Post vital signs: Reviewed and stable  Last Vitals:  Vitals:   03/30/16 0915  BP: (!) 152/71  Pulse: 65  Resp: 18  Temp: 36.8 C    Last Pain:  Vitals:   03/30/16 0915  TempSrc: Oral         Complications: No apparent anesthesia complications

## 2016-03-30 NOTE — Op Note (Signed)
PREOPERATIVE DIAGNOSIS:  Nuclear sclerotic cataract of the left eye.   POSTOPERATIVE DIAGNOSIS:  Nuclear sclerotic cataract of the left eye.   OPERATIVE PROCEDURE: Procedure(s): CATARACT EXTRACTION PHACO AND INTRAOCULAR LENS PLACEMENT (IOC)   SURGEON:  Birder Robson, MD.   ANESTHESIA:  Anesthesiologist: Martha Clan, MD CRNA: Allean Found, CRNA  1.      Managed anesthesia care. 2.      Topical tetracaine drops followed by 2% Xylocaine jelly applied in the preoperative holding area.   COMPLICATIONS:  None.   TECHNIQUE:   Stop and chop   DESCRIPTION OF PROCEDURE:  The patient was examined and consented in the preoperative holding area where the aforementioned topical anesthesia was applied to the left eye and then brought back to the Operating Room where the left eye was prepped and draped in the usual sterile ophthalmic fashion and a lid speculum was placed. A paracentesis was created with the side port blade and the anterior chamber was filled with viscoelastic. A near clear corneal incision was performed with the steel keratome. A continuous curvilinear capsulorrhexis was performed with a cystotome followed by the capsulorrhexis forceps. Hydrodissection and hydrodelineation were carried out with BSS on a blunt cannula. The lens was removed in a stop and chop  technique and the remaining cortical material was removed with the irrigation-aspiration handpiece. The capsular bag was inflated with viscoelastic and the Technis ZCB00 lens was placed in the capsular bag without complication. The remaining viscoelastic was removed from the eye with the irrigation-aspiration handpiece. The wounds were hydrated. The anterior chamber was flushed with Miostat and the eye was inflated to physiologic pressure. 0.2 mL of Vigamox diluted three/one with BSS was placed in the anterior chamber. The wounds were found to be water tight. The eye was dressed with Vigamox. The patient was given protective glasses  to wear throughout the day and a shield with which to sleep tonight. The patient was also given drops with which to begin a drop regimen today and will follow-up with me in one day.  Implant Name Type Inv. Item Serial No. Manufacturer Lot No. LRB No. Used  LENS IOL DIOP 25.0 - [id number] Intraocular Lens LENS IOL DIOP 25.0 [id number] AMO   Left 1    Procedure(s) with comments: CATARACT EXTRACTION PHACO AND INTRAOCULAR LENS PLACEMENT (IOC) (Left) - [place] 00:45 AP% 19.6 CDE 8.93 Fluid pack lot # [id number]  Electronically signed: [place] 03/30/2016 11:10 AM

## 2016-03-30 NOTE — Anesthesia Preprocedure Evaluation (Signed)
Anesthesia Evaluation  Patient identified by MRN, date of birth, ID band Patient awake    Reviewed: Allergy & Precautions, H&P , NPO status , Patient's Chart, lab work & pertinent test results, reviewed documented beta blocker date and time   History of Anesthesia Complications Negative for: history of anesthetic complications  Airway Mallampati: III  TM Distance: >3 FB Neck ROM: full    Dental no notable dental hx. (+) Edentulous Upper, Upper Dentures, Partial Lower, Poor Dentition   Pulmonary shortness of breath and with exertion, neg sleep apnea, COPD, neg recent URI, former smoker,           Cardiovascular Exercise Tolerance: Good hypertension, (-) angina+ CAD and + Cardiac Stents  (-) Past MI and (-) CABG (-) dysrhythmias (-) Valvular Problems/Murmurs     Neuro/Psych Seizures -, Well Controlled,  PSYCHIATRIC DISORDERS (Depression)  Neuromuscular disease (fibromyalgia)    GI/Hepatic Neg liver ROS, GERD  ,  Endo/Other  neg diabetesHypothyroidism   Renal/GU negative Renal ROS  negative genitourinary   Musculoskeletal   Abdominal   Peds  Hematology negative hematology ROS (+)   Anesthesia Other Findings Past Medical History: No date: Arthritis     Comment: ra No date: Cardiomegaly No date: Cataract No date: COPD (chronic obstructive pulmonary disease) (* No date: Coronary artery disease No date: Depression No date: Diverticulitis No date: Epilepsy ([place]) No date: Fibromyalgia No date: GERD (gastroesophageal reflux disease) No date: HOH (hard of hearing)     Comment: aids No date: Hypertension No date: Hypothyroidism No date: Inflammatory bowel disease No date: Lupus ([place]) 1990's: MRSA (methicillin resistant staph aureus) cult* No date: Opioid dependence ([place]) No date: Pain     Comment: chronic back and neck No date: Seizures ([place])     Comment: last 2016 No date: Spinal disorder     Comment:  stenosis No date: Thyroid disease     Comment: hypothyroid   Reproductive/Obstetrics negative OB ROS                             Anesthesia Physical Anesthesia Plan  ASA: II  Anesthesia Plan: MAC   Post-op Pain Management:    Induction:   Airway Management Planned:   Additional Equipment:   Intra-op Plan:   Post-operative Plan:   Informed Consent: I have reviewed the patients History and Physical, chart, labs and discussed the procedure including the risks, benefits and alternatives for the proposed anesthesia with the patient or authorized representative who has indicated his/her understanding and acceptance.   Dental Advisory Given  Plan Discussed with: Anesthesiologist, CRNA and Surgeon  Anesthesia Plan Comments:         Anesthesia Quick Evaluation

## 2016-03-30 NOTE — Anesthesia Postprocedure Evaluation (Signed)
Anesthesia Post Note  Patient: Diane Adkins  Procedure(s) Performed: Procedure(s) (LRB): CATARACT EXTRACTION PHACO AND INTRAOCULAR LENS PLACEMENT (IOC) (Left)  Patient location during evaluation: PACU Anesthesia Type: MAC Level of consciousness: awake Pain management: pain level controlled Vital Signs Assessment: post-procedure vital signs reviewed and stable Respiratory status: spontaneous breathing Cardiovascular status: blood pressure returned to baseline Postop Assessment: no headache Anesthetic complications: no    Last Vitals:  Vitals:   03/30/16 0915  BP: (!) 152/71  Pulse: 65  Resp: 18  Temp: 36.8 C    Last Pain:  Vitals:   03/30/16 0915  TempSrc: Oral                 Buckner [place]

## 2016-03-30 NOTE — Discharge Instructions (Signed)

## 2016-03-30 NOTE — H&P (Signed)
All labs reviewed. Abnormal studies sent to patients PCP when indicated.  Previous H&P reviewed, patient examined, there are NO CHANGES.  Diane Ormand LOUIS9/19/201710:43 AM

## 2016-03-30 NOTE — Anesthesia Procedure Notes (Signed)
Procedure Name: MAC Date/Time: 03/30/2016 10:58 AM Performed by: Allean Found Pre-anesthesia Checklist: Patient identified, Emergency Drugs available, Suction available, Patient being monitored and Timeout performed Patient Re-evaluated:Patient Re-evaluated prior to inductionOxygen Delivery Method: Nasal cannula Intubation Type: IV induction Placement Confirmation: positive ETCO2 Dental Injury: Teeth and Oropharynx as per pre-operative assessment

## 2016-04-20 ENCOUNTER — Encounter: Payer: Self-pay | Admitting: *Deleted

## 2016-04-27 ENCOUNTER — Ambulatory Visit: Payer: Medicare Other | Admitting: Registered Nurse

## 2016-04-27 ENCOUNTER — Ambulatory Visit
Admission: RE | Admit: 2016-04-27 | Discharge: 2016-04-27 | Disposition: A | Discharge status: Home / Self Care | Payer: Medicare Other | Source: Ambulatory Visit | Attending: Ophthalmology | Admitting: Ophthalmology

## 2016-04-27 ENCOUNTER — Encounter: Payer: Self-pay | Admitting: *Deleted

## 2016-04-27 ENCOUNTER — Encounter
Admission: RE | Disposition: A | Discharge status: Home / Self Care | Payer: Self-pay | Source: Ambulatory Visit | Attending: Ophthalmology

## 2016-04-27 DIAGNOSIS — I119 Hypertensive heart disease without heart failure: Secondary | ICD-10-CM | POA: Insufficient documentation

## 2016-04-27 DIAGNOSIS — G40909 Epilepsy, unspecified, not intractable, without status epilepticus: Secondary | ICD-10-CM | POA: Insufficient documentation

## 2016-04-27 DIAGNOSIS — F329 Major depressive disorder, single episode, unspecified: Secondary | ICD-10-CM | POA: Insufficient documentation

## 2016-04-27 DIAGNOSIS — J449 Chronic obstructive pulmonary disease, unspecified: Secondary | ICD-10-CM | POA: Insufficient documentation

## 2016-04-27 DIAGNOSIS — K219 Gastro-esophageal reflux disease without esophagitis: Secondary | ICD-10-CM | POA: Insufficient documentation

## 2016-04-27 DIAGNOSIS — Z87891 Personal history of nicotine dependence: Secondary | ICD-10-CM | POA: Insufficient documentation

## 2016-04-27 DIAGNOSIS — Z79899 Other long term (current) drug therapy: Secondary | ICD-10-CM | POA: Insufficient documentation

## 2016-04-27 DIAGNOSIS — K5730 Diverticulosis of large intestine without perforation or abscess without bleeding: Secondary | ICD-10-CM | POA: Insufficient documentation

## 2016-04-27 DIAGNOSIS — M069 Rheumatoid arthritis, unspecified: Secondary | ICD-10-CM | POA: Insufficient documentation

## 2016-04-27 DIAGNOSIS — E039 Hypothyroidism, unspecified: Secondary | ICD-10-CM | POA: Insufficient documentation

## 2016-04-27 DIAGNOSIS — I2510 Atherosclerotic heart disease of native coronary artery without angina pectoris: Secondary | ICD-10-CM | POA: Insufficient documentation

## 2016-04-27 DIAGNOSIS — H2511 Age-related nuclear cataract, right eye: Secondary | ICD-10-CM | POA: Insufficient documentation

## 2016-04-27 DIAGNOSIS — M797 Fibromyalgia: Secondary | ICD-10-CM | POA: Insufficient documentation

## 2016-04-27 DIAGNOSIS — Z955 Presence of coronary angioplasty implant and graft: Secondary | ICD-10-CM | POA: Insufficient documentation

## 2016-04-27 HISTORY — PX: CATARACT EXTRACTION W/PHACO: SHX586

## 2016-04-27 SURGERY — PHACOEMULSIFICATION, CATARACT, WITH IOL INSERTION
Anesthesia: Monitor Anesthesia Care | Site: Eye | Laterality: Right | Wound class: Clean

## 2016-04-27 MED ORDER — MOXIFLOXACIN HCL 0.5 % OP SOLN
1.0000 [drp] | OPHTHALMIC | Status: AC
  Administered 2016-04-27 (×3): 1 [drp] via OPHTHALMIC

## 2016-04-27 MED ORDER — ONDANSETRON HCL 4 MG/2ML IJ SOLN
INTRAMUSCULAR | Status: DC | PRN
  Administered 2016-04-27: 4 mg via INTRAVENOUS

## 2016-04-27 MED ORDER — MOXIFLOXACIN HCL 0.5 % OP SOLN
OPHTHALMIC | Status: AC
  Administered 2016-04-27: 1 [drp] via OPHTHALMIC
  Filled 2016-04-27: qty 3

## 2016-04-27 MED ORDER — SODIUM CHLORIDE 0.9 % IV SOLN
INTRAVENOUS | Status: DC
  Administered 2016-04-27: 13:00:00 via INTRAVENOUS

## 2016-04-27 MED ORDER — MIDAZOLAM HCL 2 MG/2ML IJ SOLN
INTRAMUSCULAR | Status: DC | PRN
  Administered 2016-04-27: 2 mg via INTRAVENOUS

## 2016-04-27 MED ORDER — ARMC OPHTHALMIC DILATING DROPS
1.0000 "application " | OPHTHALMIC | Status: AC
  Administered 2016-04-27 (×3): 1 via OPHTHALMIC

## 2016-04-27 MED ORDER — EPINEPHRINE PF 1 MG/ML IJ SOLN
INTRAMUSCULAR | Status: AC
Start: 2016-04-27 — End: 2016-04-27
  Filled 2016-04-27: qty 2

## 2016-04-27 MED ORDER — EPINEPHRINE PF 1 MG/ML IJ SOLN
INTRAOCULAR | Status: DC | PRN
  Administered 2016-04-27: 250 mL via OPHTHALMIC

## 2016-04-27 MED ORDER — NA CHONDROIT SULF-NA HYALURON 40-17 MG/ML IO SOLN
INTRAOCULAR | Status: AC
  Filled 2016-04-27: qty 1

## 2016-04-27 MED ORDER — CARBACHOL 0.01 % IO SOLN
INTRAOCULAR | Status: DC | PRN
  Administered 2016-04-27: 0.5 mL via INTRAOCULAR

## 2016-04-27 MED ORDER — MOXIFLOXACIN HCL 0.5 % OP SOLN
OPHTHALMIC | Status: DC | PRN
  Administered 2016-04-27: 9 [drp] via OPHTHALMIC

## 2016-04-27 MED ORDER — ONDANSETRON HCL 4 MG/2ML IJ SOLN: 4.0000 mg | Freq: Once | INTRAMUSCULAR | Status: DC | PRN

## 2016-04-27 MED ORDER — NA CHONDROIT SULF-NA HYALURON 40-17 MG/ML IO SOLN
INTRAOCULAR | Status: DC | PRN
  Administered 2016-04-27: 1 mL via INTRAOCULAR

## 2016-04-27 MED ORDER — FENTANYL CITRATE (PF) 100 MCG/2ML IJ SOLN
INTRAMUSCULAR | Status: DC | PRN
Start: 2016-04-27 — End: 2016-04-27
  Administered 2016-04-27: 50 ug via INTRAVENOUS

## 2016-04-27 MED ORDER — LIDOCAINE HCL (PF) 4 % IJ SOLN
INTRAMUSCULAR | Status: DC | PRN
  Administered 2016-04-27: 4 mL via OPHTHALMIC

## 2016-04-27 MED ORDER — POVIDONE-IODINE 5 % OP SOLN
OPHTHALMIC | Status: AC
  Filled 2016-04-27: qty 30

## 2016-04-27 MED ORDER — FENTANYL CITRATE (PF) 100 MCG/2ML IJ SOLN: 25.0000 ug | INTRAMUSCULAR | Status: DC | PRN

## 2016-04-27 MED ORDER — LIDOCAINE HCL (PF) 4 % IJ SOLN
INTRAMUSCULAR | Status: AC
  Filled 2016-04-27: qty 5

## 2016-04-27 MED ORDER — ARMC OPHTHALMIC DILATING DROPS
OPHTHALMIC | Status: AC
Start: 2016-04-27 — End: 2016-04-27
  Administered 2016-04-27: 1 via OPHTHALMIC
  Filled 2016-04-27: qty 0.4

## 2016-04-27 SURGICAL SUPPLY — 22 items
CANNULA ANT/CHMB 27G (MISCELLANEOUS) ×1 IMPLANT
CANNULA ANT/CHMB 27GA (MISCELLANEOUS) ×2 IMPLANT
CUP MEDICINE 2OZ PLAST GRAD ST (MISCELLANEOUS) ×2 IMPLANT
GLOVE BIO SURGEON STRL SZ8 (GLOVE) ×2 IMPLANT
GLOVE BIOGEL M 6.5 STRL (GLOVE) ×2 IMPLANT
GLOVE SURG LX 8.0 MICRO (GLOVE) ×1
GLOVE SURG LX STRL 8.0 MICRO (GLOVE) ×1 IMPLANT
GOWN STRL REUS W/ TWL LRG LVL3 (GOWN DISPOSABLE) ×2 IMPLANT
GOWN STRL REUS W/TWL LRG LVL3 (GOWN DISPOSABLE) ×4
LENS IOL TECNIS ITEC 25.0 (Intraocular Lens) ×1 IMPLANT
PACK CATARACT (MISCELLANEOUS) ×2 IMPLANT
PACK CATARACT BRASINGTON LX (MISCELLANEOUS) ×2 IMPLANT
PACK EYE AFTER SURG (MISCELLANEOUS) ×2 IMPLANT
SOL BSS BAG (MISCELLANEOUS) ×2
SOL PREP PVP 2OZ (MISCELLANEOUS) ×2
SOLUTION BSS BAG (MISCELLANEOUS) ×1 IMPLANT
SOLUTION PREP PVP 2OZ (MISCELLANEOUS) ×1 IMPLANT
SYR 3ML LL SCALE MARK (SYRINGE) ×2 IMPLANT
SYR 5ML LL (SYRINGE) ×2 IMPLANT
SYR TB 1ML 27GX1/2 LL (SYRINGE) ×2 IMPLANT
WATER STERILE IRR 250ML POUR (IV SOLUTION) ×2 IMPLANT
WIPE NON LINTING 3.25X3.25 (MISCELLANEOUS) ×2 IMPLANT

## 2016-04-27 NOTE — H&P (Signed)
All labs reviewed. Abnormal studies sent to patients PCP when indicated.  Previous H&P reviewed, patient examined, there are NO CHANGES.  Toshika Parrow LOUIS10/17/20171:16 PM

## 2016-04-27 NOTE — Anesthesia Procedure Notes (Signed)
Procedure Name: MAC Date/Time: 04/27/2016 1:26 PM Performed by: Doreen Salvage Pre-anesthesia Checklist: Patient identified, Emergency Drugs available, Suction available and Patient being monitored Patient Re-evaluated:Patient Re-evaluated prior to inductionOxygen Delivery Method: Nasal cannula

## 2016-04-27 NOTE — Transfer of Care (Signed)
Immediate Anesthesia Transfer of Care Note  Patient: Diane Adkins  Procedure(s) Performed: Procedure(s) with comments: CATARACT EXTRACTION PHACO AND INTRAOCULAR LENS PLACEMENT (IOC) (Right) - Lot [id number] H [place]: 00:39.1 AP%: 17.3 CDE: 6.76  Patient Location: PHASE II  Anesthesia Type:MAC  Level of Consciousness: Awake, Alert, Oriented  Airway & Oxygen Therapy: Patient Spontanous Breathing and Patient on room air   Post-op Assessment: Report given to RN and Post -op Vital signs reviewed and stable  Post vital signs: Reviewed and stable  Last Vitals:  Vitals:   04/27/16 1213 04/27/16 1347  BP: (!) 144/75 120/62  Pulse: 74 67  Resp: 18 12  Temp: 37.1 C 123XX123 C    Complications: No apparent anesthesia complications

## 2016-04-27 NOTE — Anesthesia Preprocedure Evaluation (Signed)
Anesthesia Evaluation  Patient identified by MRN, date of birth, ID band Patient awake    Reviewed: Allergy & Precautions, H&P , NPO status , Patient's Chart, lab work & pertinent test results, reviewed documented beta blocker date and time   History of Anesthesia Complications Negative for: history of anesthetic complications  Airway Mallampati: III  TM Distance: >3 FB Neck ROM: full    Dental no notable dental hx. (+) Edentulous Upper, Upper Dentures, Partial Lower, Poor Dentition   Pulmonary shortness of breath and with exertion, neg sleep apnea, COPD, neg recent URI, former smoker,    Pulmonary exam normal        Cardiovascular Exercise Tolerance: Good hypertension, Pt. on home beta blockers and Pt. on medications (-) angina+ CAD and + Cardiac Stents  (-) Past MI and (-) CABG Normal cardiovascular exam(-) dysrhythmias (-) Valvular Problems/Murmurs     Neuro/Psych Seizures -, Well Controlled,  PSYCHIATRIC DISORDERS (Depression) Depression  Neuromuscular disease (fibromyalgia)    GI/Hepatic Neg liver ROS, GERD  ,  Endo/Other  neg diabetesHypothyroidism   Renal/GU negative Renal ROS  negative genitourinary   Musculoskeletal  (+) Arthritis , Osteoarthritis,  Fibromyalgia -Chronic neck and back pain   Abdominal Normal abdominal exam  (+)   Peds  Hematology negative hematology ROS (+)   Anesthesia Other Findings Past Medical History: No date: Arthritis     Comment: ra No date: Cardiomegaly No date: Cataract No date: COPD (chronic obstructive pulmonary disease) (* No date: Coronary artery disease No date: Depression No date: Diverticulitis No date: Epilepsy ([place]) No date: Fibromyalgia No date: GERD (gastroesophageal reflux disease) No date: HOH (hard of hearing)     Comment: aids No date: Hypertension No date: Hypothyroidism No date: Inflammatory bowel disease No date: Lupus ([place]) 1990's: MRSA (methicillin  resistant staph aureus) cult* No date: Opioid dependence ([place]) No date: Pain     Comment: chronic back and neck No date: Seizures ([place])     Comment: last 2016 No date: Spinal disorder     Comment: stenosis No date: Thyroid disease     Comment: hypothyroid   Reproductive/Obstetrics negative OB ROS                             Anesthesia Physical  Anesthesia Plan  ASA: III  Anesthesia Plan: MAC   Post-op Pain Management:    Induction:   Airway Management Planned: Nasal Cannula  Additional Equipment:   Intra-op Plan:   Post-operative Plan:   Informed Consent: I have reviewed the patients History and Physical, chart, labs and discussed the procedure including the risks, benefits and alternatives for the proposed anesthesia with the patient or authorized representative who has indicated his/her understanding and acceptance.   Dental Advisory Given  Plan Discussed with: Anesthesiologist, CRNA and Surgeon  Anesthesia Plan Comments:         Anesthesia Quick Evaluation

## 2016-04-27 NOTE — Op Note (Signed)
PREOPERATIVE DIAGNOSIS:  Nuclear sclerotic cataract of the right eye.   POSTOPERATIVE DIAGNOSIS:  right nuclear sclerotic cataract   OPERATIVE PROCEDURE: Procedure(s): CATARACT EXTRACTION PHACO AND INTRAOCULAR LENS PLACEMENT (IOC)   SURGEON:  Birder Robson, MD.   ANESTHESIA:  Anesthesiologist: Alvin Critchley, MD CRNA: Doreen Salvage, CRNA  1.      Managed anesthesia care. 2.      0.21ml of Shugarcaine was instilled in the eye following the paracentesis.   COMPLICATIONS:  None.   TECHNIQUE:   Stop and chop   DESCRIPTION OF PROCEDURE:  The patient was examined and consented in the preoperative holding area where the aforementioned topical anesthesia was applied to the right eye and then brought back to the Operating Room where the right eye was prepped and draped in the usual sterile ophthalmic fashion and a lid speculum was placed. A paracentesis was created with the side port blade and the anterior chamber was filled with viscoelastic. A near clear corneal incision was performed with the steel keratome. A continuous curvilinear capsulorrhexis was performed with a cystotome followed by the capsulorrhexis forceps. Hydrodissection and hydrodelineation were carried out with BSS on a blunt cannula. The lens was removed in a stop and chop  technique and the remaining cortical material was removed with the irrigation-aspiration handpiece. The capsular bag was inflated with viscoelastic and the Technis ZCB00  lens was placed in the capsular bag without complication. The remaining viscoelastic was removed from the eye with the irrigation-aspiration handpiece. The wounds were hydrated. The anterior chamber was flushed with Miostat and the eye was inflated to physiologic pressure. 0.45ml of Vigamox was placed in the anterior chamber. The wounds were found to be water tight. The eye was dressed with Vigamox. The patient was given protective glasses to wear throughout the day and a shield with which to sleep  tonight. The patient was also given drops with which to begin a drop regimen today and will follow-up with me in one day.  Implant Name Type Inv. Item Serial No. Manufacturer Lot No. LRB No. Used  LENS IOL DIOP 25.0 - [id number] Intraocular Lens LENS IOL DIOP 25.0 [id number]   Right 1   Procedure(s) with comments: CATARACT EXTRACTION PHACO AND INTRAOCULAR LENS PLACEMENT (IOC) (Right) - Lot [id number] H [place]: 00:39.1 AP%: 17.3 CDE: 6.76  Electronically signed: Tylen Leverich LOUIS 04/27/2016 1:45 PM

## 2016-04-27 NOTE — Anesthesia Postprocedure Evaluation (Signed)
Anesthesia Post Note  Patient: Diane Adkins  Procedure(s) Performed: Procedure(s) (LRB): CATARACT EXTRACTION PHACO AND INTRAOCULAR LENS PLACEMENT (IOC) (Right)  Patient location during evaluation: PACU Anesthesia Type: MAC Level of consciousness: awake and alert Pain management: pain level controlled Vital Signs Assessment: post-procedure vital signs reviewed and stable Respiratory status: spontaneous breathing Cardiovascular status: blood pressure returned to baseline Postop Assessment: no headache, no backache and no signs of nausea or vomiting Anesthetic complications: no    Last Vitals:  Vitals:   04/27/16 1213 04/27/16 1347  BP: (!) 144/75 120/62  Pulse: 74 67  Resp: 18 12  Temp: 37.1 C 36.6 C    Last Pain:  Vitals:   04/27/16 1213  TempSrc: Tympanic  PainSc: 6                  Doreen Salvage A

## 2016-04-27 NOTE — Discharge Instructions (Signed)
Eye Surgery Discharge Instructions  Expect mild scratchy sensation or mild soreness. DO NOT RUB YOUR EYE!  The day of surgery:  Minimal physical activity, but bed rest is not required  No reading, computer work, or close hand work  No bending, lifting, or straining.  May watch TV  For 24 hours:  No driving, legal decisions, or alcoholic beverages  Safety precautions  Eat anything you prefer: It is better to start with liquids, then soup then solid foods.  _____ Eye patch should be worn until postoperative exam tomorrow.  ____ Solar shield eyeglasses should be worn for comfort in the sunlight/patch while sleeping  Resume all regular medications including aspirin or Coumadin if these were discontinued prior to surgery. You may shower, bathe, shave, or wash your hair. Tylenol may be taken for mild discomfort.  Call your doctor if you experience significant pain, nausea, or vomiting, fever > 101 or other signs of infection. [phone] or [phone] Specific instructions:  Follow-up Information    PORFILIO,WILLIAM LOUIS, MD Follow up on 04/28/2016.   Specialty:  Ophthalmology Why:  10:55 Contact information: [address] [place] [address] [phone]

## 2016-04-28 ENCOUNTER — Encounter: Payer: Self-pay | Admitting: Ophthalmology

## 2016-05-17 ENCOUNTER — Ambulatory Visit: Payer: Medicare Other | Attending: Pain Medicine | Admitting: Pain Medicine

## 2016-05-17 ENCOUNTER — Encounter: Payer: Self-pay | Admitting: Pain Medicine

## 2016-05-17 VITALS — BP 139/88 | HR 74 | Temp 98.6°F | Ht 62.0 in | Wt 115.0 lb

## 2016-05-17 DIAGNOSIS — M47812 Spondylosis without myelopathy or radiculopathy, cervical region: Secondary | ICD-10-CM | POA: Insufficient documentation

## 2016-05-17 DIAGNOSIS — K219 Gastro-esophageal reflux disease without esophagitis: Secondary | ICD-10-CM | POA: Diagnosis not present

## 2016-05-17 DIAGNOSIS — M961 Postlaminectomy syndrome, not elsewhere classified: Secondary | ICD-10-CM | POA: Diagnosis not present

## 2016-05-17 DIAGNOSIS — Z7982 Long term (current) use of aspirin: Secondary | ICD-10-CM | POA: Diagnosis not present

## 2016-05-17 DIAGNOSIS — M542 Cervicalgia: Secondary | ICD-10-CM | POA: Diagnosis not present

## 2016-05-17 DIAGNOSIS — I10 Essential (primary) hypertension: Secondary | ICD-10-CM | POA: Diagnosis not present

## 2016-05-17 DIAGNOSIS — Z79899 Other long term (current) drug therapy: Secondary | ICD-10-CM | POA: Insufficient documentation

## 2016-05-17 DIAGNOSIS — M549 Dorsalgia, unspecified: Secondary | ICD-10-CM

## 2016-05-17 DIAGNOSIS — J449 Chronic obstructive pulmonary disease, unspecified: Secondary | ICD-10-CM | POA: Diagnosis not present

## 2016-05-17 DIAGNOSIS — F329 Major depressive disorder, single episode, unspecified: Secondary | ICD-10-CM | POA: Insufficient documentation

## 2016-05-17 DIAGNOSIS — I2510 Atherosclerotic heart disease of native coronary artery without angina pectoris: Secondary | ICD-10-CM | POA: Insufficient documentation

## 2016-05-17 DIAGNOSIS — M545 Low back pain: Secondary | ICD-10-CM | POA: Diagnosis present

## 2016-05-17 DIAGNOSIS — Z79891 Long term (current) use of opiate analgesic: Secondary | ICD-10-CM | POA: Diagnosis not present

## 2016-05-17 DIAGNOSIS — G8929 Other chronic pain: Secondary | ICD-10-CM

## 2016-05-17 DIAGNOSIS — M47816 Spondylosis without myelopathy or radiculopathy, lumbar region: Secondary | ICD-10-CM | POA: Insufficient documentation

## 2016-05-17 DIAGNOSIS — Z8249 Family history of ischemic heart disease and other diseases of the circulatory system: Secondary | ICD-10-CM | POA: Diagnosis not present

## 2016-05-17 DIAGNOSIS — G40909 Epilepsy, unspecified, not intractable, without status epilepticus: Secondary | ICD-10-CM | POA: Diagnosis not present

## 2016-05-17 DIAGNOSIS — Z7902 Long term (current) use of antithrombotics/antiplatelets: Secondary | ICD-10-CM | POA: Insufficient documentation

## 2016-05-17 DIAGNOSIS — Z955 Presence of coronary angioplasty implant and graft: Secondary | ICD-10-CM | POA: Diagnosis not present

## 2016-05-17 DIAGNOSIS — Z96642 Presence of left artificial hip joint: Secondary | ICD-10-CM | POA: Diagnosis not present

## 2016-05-17 DIAGNOSIS — E039 Hypothyroidism, unspecified: Secondary | ICD-10-CM | POA: Diagnosis not present

## 2016-05-17 DIAGNOSIS — E785 Hyperlipidemia, unspecified: Secondary | ICD-10-CM | POA: Insufficient documentation

## 2016-05-17 DIAGNOSIS — Z87891 Personal history of nicotine dependence: Secondary | ICD-10-CM | POA: Diagnosis not present

## 2016-05-17 DIAGNOSIS — G894 Chronic pain syndrome: Secondary | ICD-10-CM | POA: Diagnosis not present

## 2016-05-17 NOTE — Patient Instructions (Signed)
Facet Blocks Patient Information  Description: The facets are joints in the spine between the vertebrae.  Like any joints in the body, facets can become irritated and painful.  Arthritis can also effect the facets.  By injecting steroids and local anesthetic in and around these joints, we can temporarily block the nerve supply to them.  Steroids act directly on irritated nerves and tissues to reduce selling and inflammation which often leads to decreased pain.  Facet blocks may be done anywhere along the spine from the neck to the low back depending upon the location of your pain.   After numbing the skin with local anesthetic (like Novocaine), a small needle is passed onto the facet joints under x-ray guidance.  You may experience a sensation of pressure while this is being done.  The entire block usually lasts about 15-25 minutes.   Conditions which may be treated by facet blocks:   Low back/buttock pain  Neck/shoulder pain  Certain types of headaches  Preparation for the injection:  1. Do not eat any solid food or dairy products within 8 hours of your appointment. 2. You may drink clear liquid up to 3 hours before appointment.  Clear liquids include water, black coffee, juice or soda.  No milk or cream please. 3. You may take your regular medication, including pain medications, with a sip of water before your appointment.  Diabetics should hold regular insulin (if taken separately) and take 1/2 normal NPH dose the morning of the procedure.  Carry some sugar containing items with you to your appointment. 4. A driver must accompany you and be prepared to drive you home after your procedure. 5. Bring all your current medications with you. 6. An IV may be inserted and sedation may be given at the discretion of the physician. 7. A blood pressure cuff, EKG and other monitors will often be applied during the procedure.  Some patients may need to have extra oxygen administered for a short  period. 8. You will be asked to provide medical information, including your allergies and medications, prior to the procedure.  We must know immediately if you are taking blood thinners (like Coumadin/Warfarin) or if you are allergic to IV iodine contrast (dye).  We must know if you could possible be pregnant.  Possible side-effects:   Bleeding from needle site  Infection (rare, may require surgery)  Nerve injury (rare)  Numbness & tingling (temporary)  Difficulty urinating (rare, temporary)  Spinal headache (a headache worse with upright posture)  Light-headedness (temporary)  Pain at injection site (serveral days)  Decreased blood pressure (rare, temporary)  Weakness in arm/leg (temporary)  Pressure sensation in back/neck (temporary)   Call if you experience:   Fever/chills associated with headache or increased back/neck pain  Headache worsened by an upright position  New onset, weakness or numbness of an extremity below the injection site  Hives or difficulty breathing (go to the emergency room)  Inflammation or drainage at the injection site(s)  Severe back/neck pain greater than usual  New symptoms which are concerning to you  Please note:  Although the local anesthetic injected can often make your back or neck feel good for several hours after the injection, the pain will likely return. It takes 3-7 days for steroids to work.  You may not notice any pain relief for at least one week.  If effective, we will often do a series of 2-3 injections spaced 3-6 weeks apart to maximally decrease your pain.  After the initial   series, you may be a candidate for a more permanent nerve block of the facets.  If you have any questions, please call #[phone]) [facility]  Description: The epidural space surrounds the nerves as they exit the spinal cord.  In some patients, the nerves can be  compressed and inflamed by a bulging disc or a tight spinal canal (spinal stenosis).  By injecting steroids into the epidural space, we can bring irritated nerves into direct contact with a potentially helpful medication.  These steroids act directly on the irritated nerves and can reduce swelling and inflammation which often leads to decreased pain.  Epidural steroids may be injected anywhere along the spine and from the neck to the low back depending upon the location of your pain.   After numbing the skin with local anesthetic (like Novocaine), a small needle is passed into the epidural space slowly.  You may experience a sensation of pressure while this is being done.  The entire block usually last less than 10 minutes.  Conditions which may be treated by epidural steroids:   Low back and leg pain  Neck and arm pain  Spinal stenosis  Post-laminectomy syndrome  Herpes zoster (shingles) pain  Pain from compression fractures  Preparation for the injection:  1. Do not eat any solid food or dairy products within 8 hours of your appointment.  2. You may drink clear liquids up to 3 hours before appointment.  Clear liquids include water, black coffee, juice or soda.  No milk or cream please. 3. You may take your regular medication, including pain medications, with a sip of water before your appointment  Diabetics should hold regular insulin (if taken separately) and take 1/2 normal NPH dos the morning of the procedure.  Carry some sugar containing items with you to your appointment. 4. A driver must accompany you and be prepared to drive you home after your procedure.  5. Bring all your current medications with your. 6. An IV may be inserted and sedation may be given at the discretion of the physician.   7. A blood pressure cuff, EKG and other monitors will often be applied during the procedure.  Some patients may need to have extra oxygen administered for a short period. 8. You will be asked  to provide medical information, including your allergies, prior to the procedure.  We must know immediately if you are taking blood thinners (like Coumadin/Warfarin)  Or if you are allergic to IV iodine contrast (dye). We must know if you could possible be pregnant.  Possible side-effects:  Bleeding from needle site  Infection (rare, may require surgery)  Nerve injury (rare)  Numbness & tingling (temporary)  Difficulty urinating (rare, temporary)  Spinal headache ( a headache worse with upright posture)  Light -headedness (temporary)  Pain at injection site (several days)  Decreased blood pressure (temporary)  Weakness in arm/leg (temporary)  Pressure sensation in back/neck (temporary)  Call if you experience:  Fever/chills associated with headache or increased back/neck pain.  Headache worsened by an upright position.  New onset weakness or numbness of an extremity below the injection site  Hives or difficulty breathing (go to the emergency room)  Inflammation or drainage at the infection site  Severe back/neck pain  Any new symptoms which are concerning to you  Please note:  Although the local anesthetic injected can often make your back or neck feel good for several hours after the injection, the pain  will likely return.  It takes 3-7 days for steroids to work in the epidural space.  You may not notice any pain relief for at least that one week.  If effective, we will often do a series of three injections spaced 3-6 weeks apart to maximally decrease your pain.  After the initial series, we generally will wait several months before considering a repeat injection of the same type.  If you have any questions, please call [phone] [place]  What are the risk, side effects and possible complications? Generally speaking, most procedures are safe.  However, with any procedure there are risks, side  effects, and the possibility of complications.  The risks and complications are dependent upon the sites that are lesioned, or the type of nerve block to be performed.  The closer the procedure is to the spine, the more serious the risks are.  Great care is taken when placing the radio frequency needles, block needles or lesioning probes, but sometimes complications can occur. 1. Infection: Any time there is an injection through the skin, there is a risk of infection.  This is why sterile conditions are used for these blocks.  There are four possible types of infection. 1. Localized skin infection. 2. Central Nervous System Infection-This can be in the form of Meningitis, which can be deadly. 3. Epidural Infections-This can be in the form of an epidural abscess, which can cause pressure inside of the spine, causing compression of the spinal cord with subsequent paralysis. This would require an emergency surgery to decompress, and there are no guarantees that the patient would recover from the paralysis. 4. Discitis-This is an infection of the intervertebral discs.  It occurs in about 1% of discography procedures.  It is difficult to treat and it may lead to surgery.        2. Pain: the needles have to go through skin and soft tissues, will cause soreness.       3. Damage to internal structures:  The nerves to be lesioned may be near blood vessels or    other nerves which can be potentially damaged.       4. Bleeding: Bleeding is more common if the patient is taking blood thinners such as  aspirin, Coumadin, Ticiid, Plavix, etc., or if he/she have some genetic predisposition  such as hemophilia. Bleeding into the spinal canal can cause compression of the spinal  cord with subsequent paralysis.  This would require an emergency surgery to  decompress and there are no guarantees that the patient would recover from the  paralysis.       5. Pneumothorax:  Puncturing of a lung is a possibility, every time a  needle is introduced in  the area of the chest or upper back.  Pneumothorax refers to free air around the  collapsed lung(s), inside of the thoracic cavity (chest cavity).  Another two possible  complications related to a similar event would include: Hemothorax and Chylothorax.   These are variations of the Pneumothorax, where instead of air around the collapsed  lung(s), you may have blood or chyle, respectively.       6. Spinal headaches: They may occur with any procedures in the area of the spine.       7. Persistent CSF (Cerebro-Spinal Fluid) leakage: This is a rare problem, but may occur  with prolonged intrathecal or epidural catheters either due to the formation of a fistulous  track or a  dural tear.       8. Nerve damage: By working so close to the spinal cord, there is always a possibility of  nerve damage, which could be as serious as a permanent spinal cord injury with  paralysis.       9. Death:  Although rare, severe deadly allergic reactions known as "Anaphylactic  reaction" can occur to any of the medications used.      10. Worsening of the symptoms:  We can always make thing worse.  What are the chances of something like this happening? Chances of any of this occuring are extremely low.  By statistics, you have more of a chance of getting killed in a motor vehicle accident: while driving to the hospital than any of the above occurring .  Nevertheless, you should be aware that they are possibilities.  In general, it is similar to taking a shower.  Everybody knows that you can slip, hit your head and get killed.  Does that mean that you should not shower again?  Nevertheless always keep in mind that statistics do not mean anything if you happen to be on the wrong side of them.  Even if a procedure has a 1 (one) in a 1,000,000 (million) chance of going wrong, it you happen to be that one..Also, keep in mind that by statistics, you have more of a chance of having something go wrong when taking  medications.  Who should not have this procedure? If you are on a blood thinning medication (e.g. Coumadin, Plavix, see list of "Blood Thinners"), or if you have an active infection going on, you should not have the procedure.  If you are taking any blood thinners, please inform your physician.  How should I prepare for this procedure?  Do not eat or drink anything at least six hours prior to the procedure.  Bring a driver with you .  It cannot be a taxi.  Come accompanied by an adult that can drive you back, and that is strong enough to help you if your legs get weak or numb from the local anesthetic.  Take all of your medicines the morning of the procedure with just enough water to swallow them.  If you have diabetes, make sure that you are scheduled to have your procedure done first thing in the morning, whenever possible.  If you have diabetes, take only half of your insulin dose and notify our nurse that you have done so as soon as you arrive at the clinic.  If you are diabetic, but only take blood sugar pills (oral hypoglycemic), then do not take them on the morning of your procedure.  You may take them after you have had the procedure.  Do not take aspirin or any aspirin-containing medications, at least eleven (11) days prior to the procedure.  They may prolong bleeding.  Wear loose fitting clothing that may be easy to take off and that you would not mind if it got stained with Betadine or blood.  Do not wear any jewelry or perfume  Remove any nail coloring.  It will interfere with some of our monitoring equipment.  NOTE: Remember that this is not meant to be interpreted as a complete list of all possible complications.  Unforeseen problems may occur.  BLOOD THINNERS The following drugs contain aspirin or other products, which can cause increased bleeding during surgery and should not be taken for 2 weeks prior to and 1 week after surgery.  If you should  need take something for  relief of minor pain, you may take acetaminophen which is found in Tylenol,m Datril, Anacin-3 and Panadol. It is not blood thinner. The products listed below are.  Do not take any of the products listed below in addition to any listed on your instruction sheet.  A.P.C or A.P.C with Codeine Codeine Phosphate Capsules #3 Ibuprofen Ridaura  ABC compound Congesprin Imuran rimadil  Advil Cope Indocin Robaxisal  Alka-Seltzer Effervescent Pain Reliever and Antacid Coricidin or Coricidin-D  Indomethacin Rufen  Alka-Seltzer plus Cold Medicine Cosprin Ketoprofen S-A-C Tablets  Anacin Analgesic Tablets or Capsules Coumadin Korlgesic Salflex  Anacin Extra Strength Analgesic tablets or capsules CP-2 Tablets Lanoril Salicylate  Anaprox Cuprimine Capsules Levenox Salocol  Anexsia-D Dalteparin Magan Salsalate  Anodynos Darvon compound Magnesium Salicylate Sine-off  Ansaid Dasin Capsules Magsal Sodium Salicylate  Anturane Depen Capsules Marnal Soma  APF Arthritis pain formula Dewitt's Pills Measurin Stanback  Argesic Dia-Gesic Meclofenamic Sulfinpyrazone  Arthritis Bayer Timed Release Aspirin Diclofenac Meclomen Sulindac  Arthritis pain formula Anacin Dicumarol Medipren Supac  Analgesic (Safety coated) Arthralgen Diffunasal Mefanamic Suprofen  Arthritis Strength Bufferin Dihydrocodeine Mepro Compound Suprol  Arthropan liquid Dopirydamole Methcarbomol with Aspirin Synalgos  ASA tablets/Enseals Disalcid Micrainin Tagament  Ascriptin Doan's Midol Talwin  Ascriptin A/D Dolene Mobidin Tanderil  Ascriptin Extra Strength Dolobid Moblgesic Ticlid  Ascriptin with Codeine Doloprin or Doloprin with Codeine Momentum Tolectin  Asperbuf Duoprin Mono-gesic Trendar  Aspergum Duradyne Motrin or Motrin IB Triminicin  Aspirin plain, buffered or enteric coated Durasal Myochrisine Trigesic  Aspirin Suppositories Easprin Nalfon Trillsate  Aspirin with Codeine Ecotrin Regular or Extra Strength Naprosyn Uracel  Atromid-S  Efficin Naproxen Ursinus  Auranofin Capsules Elmiron Neocylate Vanquish  Axotal Emagrin Norgesic Verin  Azathioprine Empirin or Empirin with Codeine Normiflo Vitamin E  Azolid Emprazil Nuprin Voltaren  Bayer Aspirin plain, buffered or children's or timed BC Tablets or powders Encaprin Orgaran Warfarin Sodium  Buff-a-Comp Enoxaparin Orudis Zorpin  Buff-a-Comp with Codeine Equegesic Os-Cal-Gesic   Buffaprin Excedrin plain, buffered or Extra Strength Oxalid   Bufferin Arthritis Strength Feldene Oxphenbutazone   Bufferin plain or Extra Strength Feldene Capsules Oxycodone with Aspirin   Bufferin with Codeine Fenoprofen Fenoprofen Pabalate or Pabalate-SF   Buffets II Flogesic Panagesic   Buffinol plain or Extra Strength Florinal or Florinal with Codeine Panwarfarin   Buf-Tabs Flurbiprofen Penicillamine   Butalbital Compound Four-way cold tablets Penicillin   Butazolidin Fragmin Pepto-Bismol   Carbenicillin Geminisyn Percodan   Carna Arthritis Reliever Geopen Persantine   Carprofen Gold's salt Persistin   Chloramphenicol Goody's Phenylbutazone   Chloromycetin Haltrain Piroxlcam   Clmetidine heparin Plaquenil   Cllnoril Hyco-pap Ponstel   Clofibrate Hydroxy chloroquine Propoxyphen         Before stopping any of these medications, be sure to consult the physician who ordered them.  Some, such as Coumadin (Warfarin) are ordered to prevent or treat serious conditions such as "deep thrombosis", "pumonary embolisms", and other heart problems.  The amount of time that you may need off of the medication may also vary with the medication and the reason for which you were taking it.  If you are taking any of these medications, please make sure you notify your pain physician before you undergo any procedures.

## 2016-05-17 NOTE — Progress Notes (Addendum)
Patient's Name: Diane Adkins  MRN: [id number]  Referring Provider: Casilda Carls  DOB: 1945-10-16  PCP: Casilda Carls  DOS: 05/17/2016  Note by: Kathlen Brunswick. Dossie Arbour, MD  Service setting: Ambulatory outpatient  Specialty: Interventional Pain Management  Location: [facility] (AMB) Pain Management Facility    Patient type: Established   Primary Reason(s) for Visit: Encounter for evaluation before starting new chronic pain management plan of care (Level of risk: moderate) CC: Back Pain (lower) and Neck Pain (both sides)  HPI  [name] is a 70 y.o. year old, female patient, who comes today for a follow-up evaluation to review the test results and decide on a treatment plan. She has Altered mental status; Arteriosclerosis of coronary artery; Bilateral carpal tunnel syndrome (B) (R>L) (S/P Right side release); Chronic pain; Seizure ([place]); COPD (chronic obstructive pulmonary disease) ([place]); Dysphagia; Epilepsy ([place]); Acid reflux; H/O adenomatous polyp of colon; H/O infectious disease; Personal history of other infectious and parasitic diseases; HLD (hyperlipidemia); BP (high blood pressure); Hypothyroidism; Degeneration macular; Arthritis, degenerative; Presence of stent in coronary artery; Long term prescription benzodiazepine use; Long term current use of opiate analgesic; Long term prescription opiate use; Opiate use; Encounter for therapeutic drug level monitoring; Encounter for pain management planning; 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 Her primarily concern today is the Back Pain (lower) and Neck Pain (both sides)  Pain Assessment: Self-Reported Pain Score: 7 /10 Clinically the patient looks like a 2/10 Reported level is inconsistent with clinical observations. Information on the proper use of the pain score provided to the patient today. Pain Type: Chronic pain Pain Location: Back Pain Orientation: Lower Pain Descriptors / Indicators: Aching, Constant, Sharp, Shooting Pain Frequency: Constant  [name] comes in today for a follow-up visit after her initial evaluation on 02/17/2016. Today we went over the results of her tests. These were explained in "Layman's terms". During today's appointment we went over my diagnostic impression, as well as the proposed treatment plan.  In considering the treatment plan options, [name] was reminded that I no longer take patients for medication management only. I asked her to let me know if she had no intention of taking advantage of the interventional therapies, so that we could make arrangements to provide this space to someone interested. I also made it clear that undergoing interventional therapies for the purpose of getting pain medications is very inappropriate on the part of a patient, and it will not be tolerated in this practice. This type of behavior would suggest true addiction and therefore it requires referral to an addiction specialist.   Further details on both, my assessment(s), as well as the proposed treatment plan, please see below. Controlled Substance Pharmacotherapy Assessment REMS (Risk Evaluation and Mitigation Strategy)  Analgesic: No longer taking the Methadone. Not taking any pain medications at this time. MME/day: 0 mg/day Pill Count: None expected due to no prior prescriptions written by our practice. Pharmacokinetics: Liberation and absorption (onset of action): WNL Distribution (time to peak effect): WNL Metabolism  and excretion (duration of action): WNL  Pharmacodynamics: Desired effects: Analgesia: The patient reports >50% benefit. Reported improvement in function: The patient reports medication allows her to accomplish basic ADLs. Clinically meaningful improvement in function (CMIF): Sustained CMIF goals met Perceived effectiveness: Described as relatively effective, allowing for increase in activities of daily living (ADL) Undesirable effects: Side-effects or Adverse reactions: None reported Monitoring: [place] PMP: Online review of the past 11-monthperiod previously conducted. Not applicable at this point since we have not taken over the patient's medication management yet. List of all UDS test(s) done:  Lab Results  Component Value Date   SUMMARY FINAL 02/17/2016   Last UDS on record: Summary  Date Value Ref Range Status  02/17/2016 FINAL  Final    Comment:    ==================================================================== [facility] COMP DRUG ANALYSIS,UR ==================================================================== Test                             Result       Flag       Units Drug Present and Declared for Prescription Verification   Methadone                      3355         EXPECTED   ng/mg creat   EDDP (Methadone Mtb)           1286         EXPECTED   ng/mg creat    Sources of methadone include scheduled prescription medications.    EDDP is an expected metabolite of methadone.   Gabapentin                     PRESENT      EXPECTED   Levetiracetam                  PRESENT      EXPECTED   Paroxetine                     PRESENT      EXPECTED   Diphenhydramine                PRESENT      EXPECTED   Metoprolol                     PRESENT      EXPECTED Drug Present not Declared for Prescription Verification   Acetaminophen                  PRESENT      UNEXPECTED   Guaifenesin                    PRESENT      UNEXPECTED    Guaifenesin may be administered as an  over-the-counter or    prescription drug; it may also be present as a breakdown product    of methocarbamol. Drug Absent but Declared for Prescription Verification   Alprazolam                     Not Detected UNEXPECTED ng/mg creat   Cyclobenzaprine                Not Detected UNEXPECTED   Salicylate                     Not Detected UNEXPECTED    Aspirin, as indicated in the declared medication list,  is not    always detected even when used as directed. ==================================================================== Test                      Result    Flag   Units      Ref Range   Creatinine              101              mg/dL      >=20 ==================================================================== Declared Medications:  The flagging and interpretation on this report are based on the  following declared medications.  Unexpected results may arise from  inaccuracies in the declared medications.  **Note: The testing scope of this panel includes these medications:  Alprazolam  Cyclobenzaprine  Diphenhydramine  Gabapentin  Levetiracetam  Methadone  Metoprolol  Paroxetine  **Note: The testing scope of this panel does not include small to  moderate amounts of these reported medications:  Aspirin  **Note: The testing scope of this panel does not include following  reported medications:  Armodafinil  Betamethasone  Clopidogrel  Clotrimazole  Dexlansoprazole  Levothyroxine  Multivitamin  Ranolazine ==================================================================== For clinical consultation, please call [phone]. ====================================================================    UDS interpretation: No unexpected findings.          Medication Assessment Form: Patient introduced to form today Treatment compliance: Treatment may start today if patient agrees with proposed plan. Evaluation of compliance is not applicable at this point Risk Assessment  Profile: Aberrant behavior: See prior evaluations. None observed or detected today Comorbid factors increasing risk of overdose: See prior notes. No additional risks detected today Risk Mitigation Strategies:  Patient opioid safety counseling: Completed today. Counseling provided to patient as per "Patient Counseling Document". Document signed by patient, attesting to counseling and understanding Patient-Prescriber Agreement (PPA): Obtained today  Controlled substance notification to other providers: Written and sent today  Pharmacologic Plan: Today we may be taking over the patient's pharmacological regimen. See below  Laboratory Chemistry  Inflammation Markers Lab Results  Component Value Date   ESRSEDRATE 11 03/11/2016   CRP <0.50 03/11/2016   Renal Function Lab Results  Component Value Date   BUN 20 03/11/2016   CREATININE 0.61 03/11/2016   GFRAA >60 03/11/2016   GFRNONAA >60 03/11/2016   Hepatic Function Lab Results  Component Value Date   AST 16 03/11/2016   ALT 12 (L) 03/11/2016   ALBUMIN 4.2 03/11/2016   Electrolytes Lab Results  Component Value Date   NA 140 03/11/2016   K 4.2 03/11/2016   CL 105 03/11/2016   CALCIUM 9.4 03/11/2016   MG 1.9 03/11/2016   Pain Modulating Vitamins Lab Results  Component Value Date   25OHVITD1 33 03/11/2016   25OHVITD2 1.1 03/11/2016   25OHVITD3 32 03/11/2016   VITAMINB12 >7,500 (H) 03/11/2016   Coagulation Parameters Lab Results  Component Value Date   PLT 253 12/15/2015   Cardiovascular Lab Results  Component Value Date   HGB 13.2 12/15/2015   HCT 40.8 12/15/2015   Note: Lab results reviewed and explained to patient in Layman's terms.  Recent Diagnostic Imaging Review  Cervical Imaging: Cervical MR wo contrast:  Results for orders placed in visit on 06/05/99  MR Cervical Spine Wo Contrast   Narrative FINDINGS CLINICAL DATA:  CERVICAL AND LUMBAR SPONDYLOSIS. MRI CERVICAL SPINE: ROUTINE IMAGES WERE PERFORMED.   COMPARISON WITH AN MRI FROM 05/26/1997. THERE HAS BEEN PRIOR FUSION OF C5 AND C6 VERTEBRAL BODIES.  THE ALIGNMENT IS NORMAL.  THE CORD HAS NORMAL SIGNAL. C3-4:  VERY SMALL CENTRAL DISK HERNIATION IS PRESENT, UNCHANGED. C4-5:  MILD TO MODERATE CENTRAL DIFFUSE DISK HERNIATION IS PRESENT, CAUSING FLATTENING OF THE CORD AND SPINAL STENOSIS.  THIS IS STABLE. C5-6:  POST FUSION LEVEL WITHOUT STENOSIS. C6-7:  THIS APPEARS TO BE A FUSION LEVEL AS WELL.  THERE IS NO SPINAL STENOSIS OR DISK HERNIATION. IMPRESSION STABLE CENTRAL DISK HERNIATION AT C3-4 WHICH IS VERY SMALL.  THERE IS ALSO STABLE, BROAD BASED, CENTRAL DISK HERNIATION AT C4-5 WITH MILD TO MODERATE SPINAL STENOSIS. NO NEW FINDINGS SINCE 11/98.   Cervical MR wo contrast:  Results for orders placed in visit on 02/19/09  MR C Spine Ltd W/O Cm   Narrative * PRIOR REPORT IMPORTED FROM AN EXTERNAL SYSTEM *   PRIOR REPORT IMPORTED FROM THE [facility] [place] EXAM:    unable to extend rt wrist and fingers, numbness in  outer  rt arm  COMMENTS:   PROCEDURE:     MR  - MR CERVICAL SPINE WO CONT  - Feb 20 2009  2:15PM   RESULT:   HISTORY: Right arm weakness, prior cervical spine surgery.   COMPARISON STUDIES:  MRI of cervical spine of 02/17/2007.   PROCEDURE AND FINDINGS:  Multiplanar/multisequence imaging of the cervical  spine is obtained.   The patient has undergone prior fusion from the C2-3 level to the C6-7  level. The previously identified anterolisthesis at C6-7 has been  corrected.  Noted at C3-4 is mild retrolisthesis and this is most likely degenerative.  This is stable. Noted; however, at this level is a right paracentral small  disc protrusion which abuts the adjacent C4 nerve root. The nerve root is  not swollen. There is no evidence of fracture. There is no evidence of  high-grade spinal stenosis or cervical cord deformity. No high-grade  neural  foraminal narrowing is present. Artifact is present from  the cervical  spine  fusion.   IMPRESSION:   1.  The patient has undergone prior cervical spine fusion with good  anatomic  alignment.  2.  Stable, mild retrolisthesis of C3 on C4 is noted. Noted; however, is a  new, small, right paracentral disc protrusion at C3-4 resulting in mild  distortion of the right C4 nerve root. No nerve root swelling is noted. No  high-grade neural foraminal narrowing or spinal stenosis is noted. The  cervical cord is intact.   Thank you for this opportunity to contribute to the care of your patient.       Cervical MR w/wo contrast:  Results for orders placed in visit on 02/17/07  MR Cervical Spine W Wo Contrast   Narrative * PRIOR REPORT IMPORTED FROM AN EXTERNAL SYSTEM *   PRIOR REPORT IMPORTED FROM THE [facility] WORKFL[facility] SYSTEM   REASON FOR EXAM:    Radiculitis, right upper numbness  COMMENTS:   PROCEDURE:     MR  - MR CERVICAL SPINE WO/W  - Feb 17 2007 12:51PM  RESULT:     Multiplanar/multisequence imaging of the cervical spine is  obtained pre and post intravenous administration of 12 ml of IV Magnevist.   Evaluation of the cervical cord demonstrates no T1 or T2 signal  abnormalities. Post surgical changes are appreciated involving the C4, C5,  C6 and C7 vertebral bodies. Anterior metallic prosthetic devices,  consistent  with anterior fusion, are appreciated throughout these levels.   At the C2-3 level, end-plate hypertrophic spurring is appreciated causing  partial effacement of the anterior CSF space with resulting asymmetric  mild  thecal narrowing. There does not appear to be evidence of exiting nerve  root  compression or compromise.   At the C3-4 level, end-plate hypertrophic spurring as well as a mild  broad-based disc bulge is appreciated. This too causes partial effacement  of  the anterior CSF space and there is resulting mild thecal sac narrowing.  Bilateral neural foraminal narrowing is appreciated, mild, LEFT greater  than   RIGHT. There does not appear to be evidence of exiting nerve root  compression or compromise.   At the C4-5 level, no significant thecal sac stenosis or exiting nerve  root  compromise is appreciated.   At the C5-6 level there does appear to be evidence of mild end-plate  hypertrophic spurring with partial effacement of the anterior CSF space.  No  evidence of significant exiting nerve root compression or thecal sac  stenosis is appreciated.   At the C6-7 level, there does not appear to be evidence of significant  thecal sac stenosis or exiting nerve root compression or compromise. Mild  partial effacement of the anterior CSF space is appreciated as well as  mild  thecal sac narrowing.   At the C7-T1 level, there is Grade I anterolisthesis. A broad-based disc  bulge is appreciated and these findings cause near complete effacement of  the anterior CSF space and moderate to mildly severe thecal sac stenosis.  There does appear to be bilateral neural foraminal narrowing and exiting  nerve root compromise is a diagnostic consideration bilaterally.   There are areas of increased T1 and T2 signal within the C6-7 vertebral  bodies as well as within the C2 vertebral body. Similar findings are also  appreciated within C5 and C6. These areas likely represent the sequelae of  fatty replacement of the marrow.   IMPRESSION:   1.     Area of moderate to severe thecal sac stenosis at the C7-T1 level  which appears to be secondary to broad-based disc bulge as well as  anterolisthesis. This results in moderate to mildly severe thecal sac  stenosis as well as possible bilateral exiting nerve root compromise as  well  as possibly and element of compression.  2.     Mild thecal sac narrowing is appreciated at C2-3 as well as at the  C5-6 levels. No evidence of exiting nerve root compression is appreciated  or  compromise. There does not appear to be evidence of regions of abnormal   enhancement within the soft tissues or evidence of regions of enhancing  epidural scarring resulting in thecal sac stenosis or exiting nerve root  compression or compromise.   Thank you for this opportunity to contribute to the care of your patient.       Cervical CT w contrast:  Results for orders placed during the hospital encounter of 11/09/07  CT Cervical Spine W Contrast   Narrative Clinical Data:  Neck pain, mid back pain, low back pain   MYELOGRAM CERVICAL AND THORACIC AND LUMBAR   CT MYELOGRAM CERVICAL AND THORACIC AND LUMBAR   Technique: Injection was performed by the neurosurgeon at L4.  10 ml of Omnipaque-300 was instilled in the subarachnoid space 322 gauge spinal needle.Following injection of intrathecal Omnipaque contrast, spine imaging in multiple projections was performed using fluoroscopy.   Comparison: Lumbar MRI 07/21/2007   Findings: There is a previous Ray cage fusion at L3-4.  There is mild stenosis at L4-5 with effacement  of both L5 nerve roots, right worse than left.  There is moderate scoliosis with bony overgrowth noted, particularly in the lower thoracic region where there is been previous fusion.  Because of the scoliosis, and the lumbar degenerative change, it was difficult to maneuver the contrast into the thoracic and cervical regions for myelography.  The patient is transferred CT scan for CT myelogram evaluation   Fluoroscopy Time: 3.04 minutes   IMPRESSION: As above   CT MYELOGRAPHY CERVICAL SPINE   Technique:  CT imaging of the cervical spine was performed after intrathecal contrast administration. Multiplanar CT image reconstructions were also generated.   Comparison: No prior MRI or CT scans available of the neck   Findings:  Previous cervical fusion with instrumentation.  There appears to be continuous fusion from C4-T1.  Alignment is anatomic except for mild anterolisthesis of C2 forward on C3 of approximate 2 mm.   C2-3:  Mild anterolisthesis.  Mild annular bulging.  No stenosis or nerve root encroachment.   C3-4: Advanced disk space narrowing  with central osteophyte formation and vacuum disc phenomenon.  No frank disc protrusion, and no foraminal narrowing.   C4-5: Solid fusion, no residual stenosis.   C5-6: Solid fusion, no residual stenosis.   C6-7: Solid fusion.  Mild residual uncinate spurring and neural foraminal narrowing on the left with associated mild facet arthropathy.  Equivocal left  C7 nerve root encroachment is present.   C7-T1: solid fusion.  Hardware intact.  No residual stenosis or nerve root encroachment   IMPRESSION: Satisfactory post fusion appearance, with mild degenerative change at C3-4, C2-3.   No significant residual compression, hardware failure, or adjacent segment disease.   CT MYELOGRAPHY THORACIC SPINE   Technique: CT imaging of the thoracic spine was performed after intrathecal contrast administration.  Multiplanar CT image reconstructions were also generated.   Comparison: MRI 11/27/2005   Findings:  Mild scoliosis is present convex left in the upper thoracic region of approximate 20 degrees.  Previous fusion at T10- 11 and T11-12, apparently spontaneous.  Endplate sclerotic change is present, but no destructive lesions are seen and there is no subluxation at any level.   The individual disc spaces are examined as follows:    T1-2: Normal.   T2-3:  Shallow central and leftward protrusion.  Possible right T2 nerve root encroachment   T3-4:  Normal.   T4-5:  Shallow right protrusion, mildly flattening the cord in conjunction with scoliosis.  Right T4 nerve root encroachment may be present as well   T5-6:  Normal.   T6-7:  Calcified central protrusion, mildly flattening the cord and subarachnoid space.  No signet spinal stenosis however.   T7-8:  Shallow leftward protrusion, minimally flattening the cord. No compression of the exiting nerve  root.   T8-9:  Tiny leftward protrusion, non compressive   T9-10:  Moderate sized soft disc protrusion centrally with disc space narrowing and vacuum disc phenomenon.  This does not compress the cord resolved spinal stenosis.   T10-11:  Tiny calcified protrusion, non compressive.   T11-12:  Solid fusion.  No disc pathology   Compared with prior MR there is little change.   IMPRESSION: Multiple thoracic disc protrusions as described, none of which appear result in significant cord compression or represent a significant interval change from 2007.   CT MYELOGRAPHY LUMBAR SPINE   Technique: CT imaging of the lumbar spine was performed after intrathecal contrast administration.  Multiplanar CT image reconstructions were also generated.   Comparison: Lumbar  MRI 07/21/2007   Findings:  No prevertebral or paraspinous mass.  Minimal atherosclerotic calcification.   L1-2: Advanced disc space narrowing and shallow central protrusion. Asymmetric lateral recess encroachment left due to  the scoliotic deformity.  Left L2 nerve root encroachment suspected.  No stenosis of the canal. Left foraminal narrowing compresses the L1 nerve root   L2-3: Central and leftward protrusion associated with mild scoliosis.  Left L3 nerve root encroachment present. Left L2 nerve root compression is seen in the foramen.   L3-4: Satisfactory Ray cage fusion appears solid.  No residual compression.  Wide hemilaminectomy defect, left and right.   L4-5: Shallow central protrusion with posterior element hypertrophy.  Asymmetric foraminal narrowing on the right due to uncinate spurring.  Mild right L4 and right L5 nerve root encroachment are present.   L5-S1: Mild bulge.  Moderate facet arthropathy.  No compression.   There appears to be little change compared to prior MR.   IMPRESSION: Central and leftward protrusions at L1-2 and L2-3 with severe disc space narrowing and facet overgrowth; left L2 and  left L3 encroachment observed; there is also asymmetric neural foraminal narrowing at these levels.   Solid fusion L3-4  Provider: Bonna Gains   Cervical DG 1 view:  Results for orders placed in visit on 03/02/00  DG Cervical Spine 1 View   Narrative FINDINGS CLINICAL:  POST-FUSION. CERVICAL SPINE ONE VIEW: COMPARISON 08/28/99. POST C4-5 AND C5-6 INTERBODY FUSION WITH ANTERIOR SCREWPLATING.  NO CHANGE IN POSITION OR ALIGNMENT OF THE BONE STRUTS OR HARDWARE. PRIOR FUSION AT C6-7 AS BEFORE. IMPRESSION POST-OPERATIVE CHANGES AS DESCRIBED ABOVE - NO SIGNIFICANT INTERVAL CHANGE COMPARED TO 08/28/99.   Cervical DG 2-3 views:  Results for orders placed during the hospital encounter of 05/09/07  DG Cervical Spine 2-3 Views   Narrative Clinical Data: 70 year-old female, thoracic spondylosis with myelopathy. C7-T1 ACDF. INTRAOPERATIVE CERVICAL SPINE - 3 VIEW: Findings: Image 1 is a lateral view at 1120 hours of the cervical spine from C-2 through C-6.  There is anterior plate and screw fixation from C-4 to 6.  There appears to be bony fusion at C6-7 as well. The patient is intubated. Image 2 is performed at 1125 hours. The plate and screw fixation from C-4 to 6 is redemonstrated.  Two surgical probes are now in place.  The superior tip is directed at C7-T1 inferior and is directed likely at the T-1 vertebral body although bony detail is obscured. Final image is submitted from 1215 hours.  There is new surgical hardware seen at the C-7 level that presumably extends inferiorly.  This cannot be resolved due to the overlying soft tissues. Of note there is slight retrolisthesis of C-3 on 4 on all images. IMPRESSION: 1.  Status post prior ACDF C4-6. 2.  Bony fusion at C6-7. 3.  The final image demonstrates the superior aspect of the plate and screw fixation which begins at C-7.  Provider: Cheral Almas   Cervical DG Bending/F/E views:  Results for orders placed in visit on  09/13/01  DG Cervical Spine With Flex & Extend   Narrative FINDINGS CLINICAL DATA:  PAIN. CERVICAL SPINE, PLAIN FILMS, SEVEN VIEWS OF THE CERVICAL SPINE LATERAL VIEW OF THE CERVICAL SPINE DEMONSTRATES PREVIOUS FUSION FROM C4 THOUGH C7.  THERE IS A SOLID INTERBODY FUSION AT C6-7.   THERE HAS  BEEN PREVIOUS INTERBODY FUSION WITH ANTERIOR PLATING FROM C4 THROUGH C6. FLEXION AND EXTENSION SHOWS NO ABNORMAL MOVEMENT MEASURED FROM THE SPINOUS PROCESS  OF C7 TO THE SPINOUS PROCESS OF C4. AP VIEWS SHOW THE HARDWARE CENTERED IN THE MIDLINE AND GROSSLY INTACT. OBLIQUE VIEWS SHOW SOME FORAMINAL NARROWING AT C6-7 ON THE LEFT. IMPRESSION SOLID FUSION, C4-C7. BONY FORAMINAL NARROWING, C6-7,  LEFT. NO ABNORMAL MOVEMENT BETWEEN FLEXION AND EXTENSION. MRI CERVICAL SPINE MULTIPLANAR T1- AND T2-WEIGHTED IMAGES ARE OBTAINED PRE AND POST CONTRAST.  THERE HAS BEEN PREVIOUS FUSION FROM C4 THROUGH C7 WITH ANTERIOR PLATING AT C4-5-6.  THE ALIGNMENT IS SATISFACTORY. THERE IS A BROAD-BASED CENTRAL DISC PROTRUSION AT C3-4 WITH EFFACEMENT OF THE ANTERIOR SUBARACHNOID SPACE BUT NO CORD COMPRESSION.  THERE IS NO ABNORMAL CORD SIGNAL SEEN AT C3-4.  NO C4 NERVE ROOT ENCROACHMENT IS EVIDENT. THERE IS CONSIDERABLE ARTIFACTUAL SIGNAL LOSS AT C4 AND BELOW.  THERE DOES APPEAR TO BE BONY FORAMINAL NARROWING AT C6-7 ON THE LEFT AS IDENTIFIED FROM PLAIN FILMS.  WHEN COMPARED WITH PREVIOUS MRI OF THE CERVICAL SPINE FROM 06/04/01, THE DISC PROTRUSION AT C3-4 IS CONSIDERABLY LARGER. FOLLOWING ADMINISTRATION OF CONTRAST, THERE IS NO ABNORMAL ENHANCEMENT. IMPRESSION STATUS POST FUSION, C4-C7. CENTRAL HNP, C3-4 WITH EFFACEMENT OF THE ANTERIOR SUBARACHNOID SPACE BUT NO CORD COMPRESSION. BONY FORAMINAL NARROWING AT C6-7, LEFT. MRI THORACIC SPINE MULTIPLANAR T1- AND T2-WEIGHTED IMAGES ARE OBTAINED PRE AND POST CONTRAST.  THERE IS ADVANCED DEGENERATIVE DISC DISEASE AT T11-12 WHERE THERE IS NEAR COMPLETE LOSS OF INTERSPACE  HEIGHT. SIMILAR LESS SEVERE CHANGES ARE SEEN AT T9-10 AND T10-11.  VERTEBRAL BODY ALIGNMENT IS SATISFACTORY, ALTHOUGH THERE IS MILD KYPHOTIC ANGULATION AT T11-12 DUE TO MILD COMPRESSION OF THE T12 VERTEBRA.  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 AT T9-10, THERE IS A FAIRLY PROMINENT DISC PROTRUSION, CENTRAL AND TO THE LEFT.  THERE IS EFFACEMENT OF THE ANTERIOR SUBARACHNOID SPACE BUT NO DEFINITE CORD FLATTENING.  AT T8-9, THERE IS A SMALL CENTRAL PROTRUSION. WHEN COMPARED WITH THE PREVIOUS STUDY FROM 09/03/99, THERE IS LITTLE CHANGE. FOLLOWING ADMINISTRATION OF CONTRAST, THERE IS NO ABNORMAL ENHANCEMENT OF THE THORACIC SPINE OR SURROUNDING STRUCTURES. 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 MODERATE SIZE DISC PROTRUSION, T9-10  CENTRAL AND TO THE LEFT ALONG WITH A SMALLER Vernal PROTRUSION AT T8-9 CENTRALLY. THERE IS NO SIGNIFICANT INTERVAL CHANGE WHEN COMPARED WITH 09/03/99. MRI LUMBAR SPINE MULTIPLANAR T1- AND T2-WEIGHTED IMAGES ARE OBTAINED PRE AND POST CONTRAST. THE PATIENT HAS UNDERGONE PREVIOUS PLACEMENT OF THREADED INTERBODY CAGES AT L3-4.  THE CAGES APPEAR TO BE IN GOOD POSITION.  THE ALIGNMENT IS SATISFACTORY.  THERE IS ENHANCEMENT OF THE END PLATES ABOVE AND BELOW, L3-4 AN EXPECTED POST-OP FINDING.  THERE IS ADVANCED DEGENERATIVE CHANGES AT L2-3 WITH ENHANCEMENT OF END PLATES ABOVE AND BELOW L2-3, REFLECTING ACTIVE ONGOING  DEGENERATIVE CHANGE.  THE CONUS MEDULLARIS IS WITHIN NORMAL LIMITS AND THE INDIVIDUAL DISC SPACES ARE EXAMINED AS FOLLOWS. L1-2:  MILD CENTRAL PROTRUSION. NO ROOT CUTOFF OR STENOSIS. L2-3:  BROAD-BASED DISC PROTRUSION, CENTRAL AND TO THE LEFT.  LEFT L2 AND L3 NERVE ROOT ENCROACHMENT IS PRESENT. L3-4:  SATISFACTORY POST-OPERATIVE APPEARANCE. L4-5:  SMALL CENTRAL PROTRUSION.  NO ROOT CUTOFF OR SPINAL STENOSIS. MARKED POSTERIOR  ELEMENT HYPERTROPHY IS SEEN. L5-S1:  MARKED POSTERIOR ELEMENT HYPERTROPHY. NO STENOSIS OR DISC PROTRUSION. COMPARED WITH 04/08/00, THE DISC PROTRUSIONS AT L1-2 AND L2-3 HAVE DECREASED SOMEWHAT IN SIZE.   IN ADDITION THE DISC PROTRUSION AT L4-5 HAS ALSO DECREASED IN SIZE. IMPRESSION MODERATE DISC PROTRUSION, L2-3, CENTRAL AND TO  THE LEFT WITH LEFT L2 AND LEFT L3 NERVE ROOT ENCROACHMENT. SMALLER CENTRAL PROTRUSIONS OF L1-2 AND L4-5. STATUS POST THREADED INTERBODY CAGES, L3-4. COMPARED WITH THE PREVIOUS STUDY FROM 04/08/00, THE DISC PROTRUSIONS HAVE ALL DECREASED SLIGHTLY IN SIZE.  Provider: Margaretmary Eddy   Cervical DG complete:  Results for orders placed during the hospital encounter of 03/11/16  DG Cervical Spine Complete   Narrative CLINICAL DATA:  70 year old female with chronic spine pain. Prior cervical thoracic and lumbar spine surgeries. Scoliosis. Subsequent encounter.  EXAM: CERVICAL SPINE - COMPLETE 4+ VIEW  COMPARISON:  Cervical spine CT 01/27/2011 and earlier.  FINDINGS: Sequelae of C4-C5 through C7-T1 ACDF. Hardware remains in place from the C4-C6, and at the C7 and T1 levels. Hardware appears intact and continued solid arthrodesis to the C7 level is suspected. There may be continued nonunion at C7-T1.  Adjacent segment disease at C3-C4 with severe disc space loss, retrolisthesis, and bulky endplate osteophytosis. Trace anterolisthesis of C2 on C3 appears stable. Bilateral posterior element alignment is within normal limits. C1-C2 alignment preserved. Negative visualized lung apices. Partially visible dextro convex upper thoracic scoliosis. Calcified cervical carotid atherosclerosis.  IMPRESSION: Extensive prior cervical spine fusion. 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 Pseudarthrosis suspected at C7-T1. Repeat cervical spine CT without contrast would evaluate further as necessary.   Electronically Signed   By: Genevie Ann M.D.   On: 03/11/2016 15:38     Shoulder Imaging: Shoulder-R DG:  Results for orders placed in visit on 01/26/11  DG Shoulder Right   Narrative * PRIOR REPORT IMPORTED FROM AN EXTERNAL SYSTEM *   PRIOR REPORT IMPORTED FROM THE [facility] WORK[employer]OW SYSTEM   REASON FOR EXAM:    pain  COMMENTS:   PROCEDURE:     DXR - DXR SHOULDER RIGHT COMPLETE  - Jan 27 2011  3:09AM   RESULT:     Comparison: None.   Findings:  No acute fracture or dislocation. Post surgical changes seen from prior  distal clavicle resection. Small round lucency overlying the superior  lateral aspect of the humeral head likely secondary to subcortical cystic  change at the insertion of the supraspinatus.   IMPRESSION:  No acute fracture or dislocation.       Thoracic Imaging: Thoracic MR wo contrast:  Results for orders placed during the hospital encounter of 11/27/05  MR Thoracic Spine Wo Contrast   Narrative Clinical data: Severe back pain. Myelopathy.   MRI OF THORACIC SPINE WITHOUT CONTRAST:  Technique: Multiplanar and multiecho pulse sequences of the thoracic spine were obtained according to standard protocol without IV contrast.  Findings: Counting of the levels is difficult in this case due to cervical fusion and difficulty counting the cervical vertebrae. I believe I have the numbering accurate; however, it could be off by a level.   There is scoliosis convex to the right in the lower thoracic spine and convex to the left in the upper thoracic spine. There is no fracture. Hemangioma is noted in the T9 vertebral body. There is partial fusion of the T11 and T12 vertebra on the left which is probably due to disc degeneration. No fracture or mass lesion is identified  T2-3: Mild disc degeneration.   T3-4: Negative.   T4-5: Right sided osteophyte and disc are present with flattening of the cord on the right.   T5-6: Negative.   T6-7: Central disc protrusion is present with mild flattening of the ventral cord in the midline.   T7-8: Small left  sided disc protrusion  with flattening of the left side of the cord.   T8-9: Small left sided disc protrusion.   T9-10: Small left sided disc protrusion without significant cord deformity.   T10-11: Right sided osteophyte is present with mild flattening of the cord.   T11-12: Partial interbody fusion which is probably degenerative.  T12-L1: Small central and right sided disc protrusion without stenosis.   L1-2: Central disc protrusion with some associated spurring.   IMPRESSION:  1. Multilevel disc degeneration and spondylosis. Multiple small disc protrusions are present. There is no fracture or mass.   2. Scoliosis.  Provider: Sedonia Small   Thoracic MR w/wo contrast:  Results for orders placed in visit on 09/13/01  MR Thoracic Spine W Wo Contrast   Narrative FINDINGS CLINICAL DATA:  PAIN. CERVICAL SPINE, PLAIN FILMS, SEVEN VIEWS OF THE CERVICAL SPINE LATERAL VIEW OF THE CERVICAL SPINE DEMONSTRATES PREVIOUS FUSION FROM C4 THOUGH C7.  THERE IS A SOLID INTERBODY FUSION AT C6-7.   THERE HAS  BEEN PREVIOUS INTERBODY FUSION WITH ANTERIOR PLATING FROM C4 THROUGH C6. FLEXION AND EXTENSION SHOWS NO ABNORMAL MOVEMENT MEASURED FROM THE SPINOUS PROCESS OF C7 TO THE SPINOUS PROCESS OF C4. AP VIEWS SHOW THE HARDWARE CENTERED IN THE MIDLINE AND GROSSLY INTACT. OBLIQUE VIEWS SHOW SOME FORAMINAL NARROWING AT C6-7 ON THE LEFT. IMPRESSION SOLID FUSION, C4-C7. BONY FORAMINAL NARROWING, C6-7,  LEFT. NO ABNORMAL MOVEMENT BETWEEN FLEXION AND EXTENSION. MRI CERVICAL SPINE MULTIPLANAR T1- AND T2-WEIGHTED IMAGES ARE OBTAINED PRE AND POST CONTRAST.  THERE HAS BEEN PREVIOUS FUSION FROM C4 THROUGH C7 WITH ANTERIOR PLATING AT C4-5-6.  THE ALIGNMENT IS SATISFACTORY. THERE IS A BROAD-BASED CENTRAL DISC PROTRUSION AT C3-4 WITH EFFACEMENT OF THE ANTERIOR SUBARACHNOID SPACE BUT NO CORD COMPRESSION.  THERE IS NO ABNORMAL CORD SIGNAL SEEN AT C3-4.  NO C4 NERVE ROOT ENCROACHMENT IS EVIDENT. THERE IS CONSIDERABLE  ARTIFACTUAL SIGNAL LOSS AT C4 AND BELOW.  THERE DOES APPEAR TO BE BONY FORAMINAL NARROWING AT C6-7 ON THE LEFT AS IDENTIFIED FROM PLAIN FILMS.  WHEN COMPARED WITH PREVIOUS MRI OF THE CERVICAL SPINE FROM 06/04/01, THE DISC PROTRUSION AT C3-4 IS CONSIDERABLY LARGER. FOLLOWING ADMINISTRATION OF CONTRAST, THERE IS NO ABNORMAL ENHANCEMENT. IMPRESSION STATUS POST FUSION, C4-C7. CENTRAL HNP, C3-4 WITH EFFACEMENT OF THE ANTERIOR SUBARACHNOID SPACE BUT NO CORD COMPRESSION. BONY FORAMINAL NARROWING AT C6-7, LEFT. MRI THORACIC SPINE MULTIPLANAR T1- AND T2-WEIGHTED IMAGES ARE OBTAINED PRE AND POST CONTRAST.  THERE IS ADVANCED DEGENERATIVE DISC DISEASE AT T11-12 WHERE THERE IS NEAR COMPLETE LOSS OF INTERSPACE HEIGHT. SIMILAR LESS SEVERE CHANGES ARE SEEN AT T9-10 AND T10-11.  VERTEBRAL BODY ALIGNMENT IS SATISFACTORY, ALTHOUGH THERE IS MILD KYPHOTIC ANGULATION AT T11-12 DUE TO MILD COMPRESSION OF THE T12 VERTEBRA.  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 AT T9-10, THERE IS A FAIRLY PROMINENT DISC PROTRUSION, CENTRAL AND TO THE LEFT.  THERE IS EFFACEMENT OF THE ANTERIOR SUBARACHNOID SPACE BUT NO DEFINITE CORD FLATTENING.  AT T8-9, THERE IS A SMALL CENTRAL PROTRUSION. WHEN COMPARED WITH THE PREVIOUS STUDY FROM 09/03/99, THERE IS LITTLE CHANGE. FOLLOWING ADMINISTRATION OF CONTRAST, THERE IS NO ABNORMAL ENHANCEMENT OF THE THORACIC SPINE OR SURROUNDING STRUCTURES. 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 MODERATE SIZE DISC PROTRUSION, T9-10  CENTRAL AND TO THE LEFT ALONG WITH A SMALLER [place]lap PROTRUSION AT T8-9 CENTRALLY. THERE IS NO SIGNIFICANT INTERVAL CHANGE WHEN COMPARED WITH 09/03/99. MRI LUMBAR SPINE MULTIPLANAR  T1- AND T2-WEIGHTED IMAGES ARE OBTAINED PRE AND POST CONTRAST. THE PATIENT HAS UNDERGONE PREVIOUS PLACEMENT OF THREADED INTERBODY CAGES AT L3-4.  THE CAGES  APPEAR TO BE IN GOOD POSITION.  THE ALIGNMENT IS SATISFACTORY.  THERE IS ENHANCEMENT OF THE END PLATES ABOVE AND BELOW, L3-4 AN EXPECTED POST-OP FINDING.  THERE IS ADVANCED DEGENERATIVE CHANGES AT L2-3 WITH ENHANCEMENT OF END PLATES ABOVE AND BELOW L2-3, REFLECTING ACTIVE ONGOING  DEGENERATIVE CHANGE.  THE CONUS MEDULLARIS IS WITHIN NORMAL LIMITS AND THE INDIVIDUAL DISC SPACES ARE EXAMINED AS FOLLOWS. L1-2:  MILD CENTRAL PROTRUSION. NO ROOT CUTOFF OR STENOSIS. L2-3:  BROAD-BASED DISC PROTRUSION, CENTRAL AND TO THE LEFT.  LEFT L2 AND L3 NERVE ROOT ENCROACHMENT IS PRESENT. L3-4:  SATISFACTORY POST-OPERATIVE APPEARANCE. L4-5:  SMALL CENTRAL PROTRUSION.  NO ROOT CUTOFF OR SPINAL STENOSIS. MARKED POSTERIOR ELEMENT HYPERTROPHY IS SEEN. L5-S1:  MARKED POSTERIOR ELEMENT HYPERTROPHY. NO STENOSIS OR DISC PROTRUSION. COMPARED WITH 04/08/00, THE DISC PROTRUSIONS AT L1-2 AND L2-3 HAVE DECREASED SOMEWHAT IN SIZE.   IN ADDITION THE DISC PROTRUSION AT L4-5 HAS ALSO DECREASED IN SIZE. IMPRESSION MODERATE DISC PROTRUSION, L2-3, CENTRAL AND TO THE LEFT WITH LEFT L2 AND LEFT L3 NERVE ROOT ENCROACHMENT. SMALLER CENTRAL PROTRUSIONS OF L1-2 AND L4-5. STATUS POST THREADED INTERBODY CAGES, L3-4. COMPARED WITH THE PREVIOUS STUDY FROM 04/08/00, THE DISC PROTRUSIONS HAVE ALL DECREASED SLIGHTLY IN SIZE.  Provider: Margaretmary Eddy   Thoracic CT w contrast:  Results for orders placed during the hospital encounter of 11/09/07  CT Thoracic Spine W Contrast   Narrative Clinical Data:  Neck pain, mid back pain, low back pain   MYELOGRAM CERVICAL AND THORACIC AND LUMBAR   CT MYELOGRAM CERVICAL AND THORACIC AND LUMBAR   Technique: Injection was performed by the neurosurgeon at L4.  10 ml of Omnipaque-300 was instilled in the subarachnoid space 322 gauge spinal needle.Following injection of intrathecal Omnipaque contrast, spine imaging in multiple projections was performed using fluoroscopy.   Comparison: Lumbar MRI  07/21/2007   Findings: There is a previous Ray cage fusion at L3-4.  There is mild stenosis at L4-5 with effacement of both L5 nerve roots, right worse than left.  There is moderate scoliosis with bony overgrowth noted, particularly in the lower thoracic region where there is been previous fusion.  Because of the scoliosis, and the lumbar degenerative change, it was difficult to maneuver the contrast into the thoracic and cervical regions for myelography.  The patient is transferred CT scan for CT myelogram evaluation   Fluoroscopy Time: 3.04 minutes   IMPRESSION: As above   CT MYELOGRAPHY CERVICAL SPINE   Technique:  CT imaging of the cervical spine was performed after intrathecal contrast administration. Multiplanar CT image reconstructions were also generated.   Comparison: No prior MRI or CT scans available of the neck   Findings:  Previous cervical fusion with instrumentation.  There appears to be continuous fusion from C4-T1.  Alignment is anatomic except for mild anterolisthesis of C2 forward on C3 of approximate 2 mm.   C2-3: Mild anterolisthesis.  Mild annular bulging.  No stenosis or nerve root encroachment.   C3-4: Advanced disk space narrowing  with central osteophyte formation and vacuum disc phenomenon.  No frank disc protrusion, and no foraminal narrowing.   C4-5: Solid fusion, no residual stenosis.   C5-6: Solid fusion, no residual stenosis.   C6-7: Solid fusion.  Mild residual uncinate spurring and neural foraminal narrowing on the left with associated mild facet arthropathy.  Equivocal left  C7 nerve root  encroachment is present.   C7-T1: solid fusion.  Hardware intact.  No residual stenosis or nerve root encroachment   IMPRESSION: Satisfactory post fusion appearance, with mild degenerative change at C3-4, C2-3.   No significant residual compression, hardware failure, or adjacent segment disease.   CT MYELOGRAPHY THORACIC SPINE   Technique:  CT imaging of the thoracic spine was performed after intrathecal contrast administration.  Multiplanar CT image reconstructions were also generated.   Comparison: MRI 11/27/2005   Findings:  Mild scoliosis is present convex left in the upper thoracic region of approximate 20 degrees.  Previous fusion at T10- 11 and T11-12, apparently spontaneous.  Endplate sclerotic change is present, but no destructive lesions are seen and there is no subluxation at any level.   The individual disc spaces are examined as follows:    T1-2: Normal.   T2-3:  Shallow central and leftward protrusion.  Possible right T2 nerve root encroachment   T3-4:  Normal.   T4-5:  Shallow right protrusion, mildly flattening the cord in conjunction with scoliosis.  Right T4 nerve root encroachment may be present as well   T5-6:  Normal.   T6-7:  Calcified central protrusion, mildly flattening the cord and subarachnoid space.  No signet spinal stenosis however.   T7-8:  Shallow leftward protrusion, minimally flattening the cord. No compression of the exiting nerve root.   T8-9:  Tiny leftward protrusion, non compressive   T9-10:  Moderate sized soft disc protrusion centrally with disc space narrowing and vacuum disc phenomenon.  This does not compress the cord resolved spinal stenosis.   T10-11:  Tiny calcified protrusion, non compressive.   T11-12:  Solid fusion.  No disc pathology   Compared with prior MR there is little change.   IMPRESSION: Multiple thoracic disc protrusions as described, none of which appear result in significant cord compression or represent a significant interval change from 2007.   CT MYELOGRAPHY LUMBAR SPINE   Technique: CT imaging of the lumbar spine was performed after intrathecal contrast administration.  Multiplanar CT image reconstructions were also generated.   Comparison: Lumbar MRI 07/21/2007   Findings:  No prevertebral or paraspinous mass.   Minimal atherosclerotic calcification.   L1-2: Advanced disc space narrowing and shallow central protrusion. Asymmetric lateral recess encroachment left due to  the scoliotic deformity.  Left L2 nerve root encroachment suspected.  No stenosis of the canal. Left foraminal narrowing compresses the L1 nerve root   L2-3: Central and leftward protrusion associated with mild scoliosis.  Left L3 nerve root encroachment present. Left L2 nerve root compression is seen in the foramen.   L3-4: Satisfactory Ray cage fusion appears solid.  No residual compression.  Wide hemilaminectomy defect, left and right.   L4-5: Shallow central protrusion with posterior element hypertrophy.  Asymmetric foraminal narrowing on the right due to uncinate spurring.  Mild right L4 and right L5 nerve root encroachment are present.   L5-S1: Mild bulge.  Moderate facet arthropathy.  No compression.   There appears to be little change compared to prior MR.   IMPRESSION: Central and leftward protrusions at L1-2 and L2-3 with severe disc space narrowing and facet overgrowth; left L2 and left L3 encroachment observed; there is also asymmetric neural foraminal narrowing at these levels.   Solid fusion L3-4  Provider: Bonna Gains   Thoracic DG 2-3 views:  Results for orders placed during the hospital encounter of 03/11/16  DG Thoracic Spine 2 View   Narrative CLINICAL DATA:  Chronic upper and  lower back pain.  Known scoliosis.  EXAM: THORACIC SPINE 2 VIEWS  COMPARISON:  Portable chest x-ray of December 15, 2015 and thoracic spine series of January 27, 2011  FINDINGS: There is prominent dextroscoliosis of the thoracolumbar spine centered at T12-L1. The bones are subjectively osteopenic. There is multilevel degenerative disc disease of the thoracic spine. No compression fracture is observed. There are no abnormal paravertebral soft tissue densities. The patient has undergone anterior fusion of the mid and lower  cervical spine and intradiscal device placement at L3-4.  IMPRESSION: Moderate levoscoliosis centered at the thoracolumbar junction. Mild multilevel degenerative disc disease. No acute bony abnormality.  Aortic atherosclerosis.   Electronically Signed   By: David  Martinique M.D.   On: 03/11/2016 15:36    Lumbosacral Imaging: Lumbar MR wo contrast:  Results for orders placed during the hospital encounter of 03/11/16  MR Lumbar Spine Wo Contrast   Narrative CLINICAL DATA:  Low back pain since 1983, occasional leg pain and numbness, now worsening. History of surgery 1990.  EXAM: MRI LUMBAR SPINE WITHOUT CONTRAST  TECHNIQUE: Multiplanar, multisequence MR imaging of the lumbar spine was performed. No intravenous contrast was administered.  COMPARISON:  Lumbar spine radiographs March 11, 2016 and MRI of lumbar spine December 21, 2008  FINDINGS: SEGMENTATION: For the purposes of this report, the last well-formed intervertebral disc will be described as L5-S1.  ALIGNMENT: L2-3 and L4-5 retrolisthesis without spondylolysis. Severe dextroscoliosis inferred on the axial sequences. Maintenance of the lumbar lordosis.  VERTEBRAE:Lumbar vertebral bodies are intact. L1-2 interbody arthrodesis. L3-4 disc prosthesis results in susceptibility artifact. Severe L1-2 and L2-3 disc height loss. Moderate to severe L4-5 disc height loss, mild at L5-S1. Decreased T2 signal within all disc compatible with desiccation. Multilevel mild or moderate subacute on chronic discogenic endplate change changes. Scattered small hemangiomas and focal fat. Included thoracic spine demonstrates T10 through T12 interbody arthrodesis. Acute moderate discogenic endplate changes predominately on the LEFT at T12-L1. Scattered acute Schmorl's nodes. Mild acute discogenic endplate changes U8-8.  CONUS MEDULLARIS: Conus medullaris terminates at L1-2 and demonstrates normal morphology and signal characteristics.  Cauda equina is normal.  PARASPINAL AND SOFT TISSUES: Multiple sub cm low T1 and low T2 likely hemorrhagic cysts in the LEFT kidney. 9 mm cyst LEFT kidney. Mild symmetric paraspinal muscle atrophy.  DISC LEVELS:  T11-12: Tiny central disc osteophyte complex without canal stenosis or neural foraminal narrowing.  T12-L1: Small broad-based disc bulge and endplate spurring. Mild facet arthropathy and ligamentum flavum redundancy. Mild thecal sac effacement without canal stenosis. No neural foraminal narrowing.  L1-2: Small broad-based disc bulge, endplate spurring. No canal stenosis. Mild LEFT neural foraminal narrowing.  L2-3: Retrolisthesis. Small broad-based disc bulge and endplate spurring. Mild facet arthropathy and ligamentum flavum redundancy without canal stenosis. Moderate RIGHT, mild LEFT neural foraminal narrowing.  L3-4: Interbody fusion. No canal stenosis or neural foraminal narrowing.  L4-5: Retrolisthesis. Small broad-based disc bulge and moderate RIGHT subarticular extra foraminal disc protrusion and may affect the exited RIGHT L4 nerve. Partial effacement RIGHT lateral recess may affect the traversing RIGHT L5 nerve. No canal stenosis. Severe RIGHT, mild LEFT neural foraminal narrowing.  L5-S1: Small broad-based disc bulge. Severe facet arthropathy with trace LEFT facet effusion which is likely reactive. No canal stenosis. Mild RIGHT neural foraminal narrowing.  IMPRESSION: No acute fracture. Advanced degenerative change of the included thoracic and lumbar spine, and severe lumbar dextroscoliosis on this nonweightbearing examination.  Grade 1 L2-3 and L4-5 retrolisthesis. Status post L3-4 interbody fusion.  No canal stenosis. Multilevel neural foraminal narrowing: Severe on the RIGHT at L4-5 in part due to moderate disc protrusion.   Electronically Signed   By: Elon Alas M.D.   On: 03/11/2016 17:09    Lumbar MR w/wo contrast:  Results for  orders placed in visit on 12/21/08  MR Lumbar Spine W Wo Contrast   Narrative * PRIOR REPORT IMPORTED FROM AN EXTERNAL SYSTEM *   PRIOR REPORT IMPORTED FROM THE [facility] WORKFLOW SYSTEM   REASON FOR EXAM:    low back pain  COMMENTS:   PROCEDURE:     MR  - MR LUMBAR SPINE WO/W  - Dec 21 2008  1:59PM   RESULT:     Sagittal and axial T1 pre- and postgadolinium images as well  as  T2 weighted images were obtained through the lumbar spine.   There is metallic susceptibility artifact at the level of the L3-L4 disc  compatible with placement of an intradiscal device in the past. The  patient  has apparently undergone prior laminectomy in the lumbar spine in the  past.  The lumbar vertebral bodies are preserved in height. In the body of L2  there  is a well-circumscribed 1.5 cm diameter structure most compatible with a  hemangioma. There is no evidence of bone marrow edema. The conus medullary  is appears to terminate posterior to the body of approximately L1. Axial  imaging was performed from the mid body of T12 to the mid body of S1.   At T12-L1 there is an asymmetric endplate bar toward the right. In the  midline the AP dimension the thecal sac remains 15 mm and the neural  foramina are patent.    At L1-L2 there is annular disc bulging with endplate bar formation. The  AP  dimension the thecal sac is 14 mm and the neural foramina are patent.    At L2-L3 there is annular disc bulging. The AP dimension of the thecal  sac  measures approximately 15 mm and the neural foramina are patent. Mild  encroachment on the left is present here.   At L3-L4 the AP dimension of the thecal sac measures nearly 2 cm and the  neural foramina are patent.   At L4-L5 there is mild annular disc bulging. The AP dimension of the  thecal  sac measures roughly 13 mm there is mild encroachment upon the left neural  foramen.    At L5-S1 the AP dimension of the CSF filled thecal sac is approximately  14   mm and the neural foramina are patent. Following gadolinium enhancement I  do  not see significant abnormal enhancement to suggest scarring.   IMPRESSION:      There is multilevel degenerative disc disease without  high-grade spinal stenosis as described above. There are variable degrees  of  mild neural foraminal encroachment seen at several levels. There is no  discrete focal HNP. I do not see abnormal enhancement to suggest scarring.       Lumbar CT w contrast:  Results for orders placed during the hospital encounter of 11/09/07  CT Lumbar Spine W Contrast   Narrative Clinical Data:  Neck pain, mid back pain, low back pain   MYELOGRAM CERVICAL AND THORACIC AND LUMBAR   CT MYELOGRAM CERVICAL AND THORACIC AND LUMBAR   Technique: Injection was performed by the neurosurgeon at L4.  10 ml of Omnipaque-300 was instilled in the subarachnoid space 322 gauge spinal needle.Following injection of intrathecal Omnipaque contrast, spine imaging  in multiple projections was performed using fluoroscopy.   Comparison: Lumbar MRI 07/21/2007   Findings: There is a previous Ray cage fusion at L3-4.  There is mild stenosis at L4-5 with effacement of both L5 nerve roots, right worse than left.  There is moderate scoliosis with bony overgrowth noted, particularly in the lower thoracic region where there is been previous fusion.  Because of the scoliosis, and the lumbar degenerative change, it was difficult to maneuver the contrast into the thoracic and cervical regions for myelography.  The patient is transferred CT scan for CT myelogram evaluation   Fluoroscopy Time: 3.04 minutes   IMPRESSION: As above   CT MYELOGRAPHY CERVICAL SPINE   Technique:  CT imaging of the cervical spine was performed after intrathecal contrast administration. Multiplanar CT image reconstructions were also generated.   Comparison: No prior MRI or CT scans available of the neck   Findings:  Previous cervical  fusion with instrumentation.  There appears to be continuous fusion from C4-T1.  Alignment is anatomic except for mild anterolisthesis of C2 forward on C3 of approximate 2 mm.   C2-3: Mild anterolisthesis.  Mild annular bulging.  No stenosis or nerve root encroachment.   C3-4: Advanced disk space narrowing  with central osteophyte formation and vacuum disc phenomenon.  No frank disc protrusion, and no foraminal narrowing.   C4-5: Solid fusion, no residual stenosis.   C5-6: Solid fusion, no residual stenosis.   C6-7: Solid fusion.  Mild residual uncinate spurring and neural foraminal narrowing on the left with associated mild facet arthropathy.  Equivocal left  C7 nerve root encroachment is present.   C7-T1: solid fusion.  Hardware intact.  No residual stenosis or nerve root encroachment   IMPRESSION: Satisfactory post fusion appearance, with mild degenerative change at C3-4, C2-3.   No significant residual compression, hardware failure, or adjacent segment disease.   CT MYELOGRAPHY THORACIC SPINE   Technique: CT imaging of the thoracic spine was performed after intrathecal contrast administration.  Multiplanar CT image reconstructions were also generated.   Comparison: MRI 11/27/2005   Findings:  Mild scoliosis is present convex left in the upper thoracic region of approximate 20 degrees.  Previous fusion at T10- 11 and T11-12, apparently spontaneous.  Endplate sclerotic change is present, but no destructive lesions are seen and there is no subluxation at any level.   The individual disc spaces are examined as follows:    T1-2: Normal.   T2-3:  Shallow central and leftward protrusion.  Possible right T2 nerve root encroachment   T3-4:  Normal.   T4-5:  Shallow right protrusion, mildly flattening the cord in conjunction with scoliosis.  Right T4 nerve root encroachment may be present as well   T5-6:  Normal.   T6-7:  Calcified central protrusion, mildly  flattening the cord and subarachnoid space.  No signet spinal stenosis however.   T7-8:  Shallow leftward protrusion, minimally flattening the cord. No compression of the exiting nerve root.   T8-9:  Tiny leftward protrusion, non compressive   T9-10:  Moderate sized soft disc protrusion centrally with disc space narrowing and vacuum disc phenomenon.  This does not compress the cord resolved spinal stenosis.   T10-11:  Tiny calcified protrusion, non compressive.   T11-12:  Solid fusion.  No disc pathology   Compared with prior MR there is little change.   IMPRESSION: Multiple thoracic disc protrusions as described, none of which appear result in significant cord compression or represent a significant interval change from  2007.   CT MYELOGRAPHY LUMBAR SPINE   Technique: CT imaging of the lumbar spine was performed after intrathecal contrast administration.  Multiplanar CT image reconstructions were also generated.   Comparison: Lumbar MRI 07/21/2007   Findings:  No prevertebral or paraspinous mass.  Minimal atherosclerotic calcification.   L1-2: Advanced disc space narrowing and shallow central protrusion. Asymmetric lateral recess encroachment left due to  the scoliotic deformity.  Left L2 nerve root encroachment suspected.  No stenosis of the canal. Left foraminal narrowing compresses the L1 nerve root   L2-3: Central and leftward protrusion associated with mild scoliosis.  Left L3 nerve root encroachment present. Left L2 nerve root compression is seen in the foramen.   L3-4: Satisfactory Ray cage fusion appears solid.  No residual compression.  Wide hemilaminectomy defect, left and right.   L4-5: Shallow central protrusion with posterior element hypertrophy.  Asymmetric foraminal narrowing on the right due to uncinate spurring.  Mild right L4 and right L5 nerve root encroachment are present.   L5-S1: Mild bulge.  Moderate facet arthropathy.  No compression.    There appears to be little change compared to prior MR.   IMPRESSION: Central and leftward protrusions at L1-2 and L2-3 with severe disc space narrowing and facet overgrowth; left L2 and left L3 encroachment observed; there is also asymmetric neural foraminal narrowing at these levels.   Solid fusion L3-4  Provider: Bonna Gains   Lumbar DG 2-3 views:  Results for orders placed in visit on 01/26/11  DG Lumbar Spine 2-3 Views   Narrative * PRIOR REPORT IMPORTED FROM AN EXTERNAL SYSTEM *   PRIOR REPORT IMPORTED FROM THE [facility] [place] EXAM:    pain  COMMENTS:   May transport without cardiac monitor   PROCEDURE:     DXR - DXR LUMBAR SPINE AP AND LATERAL  - Jan 27 2011   3:09AM   RESULT:     Comparison: MRI of the lumbar spine 12/21/2008   Findings:  There is an S-shaped curvature of the thoracolumbar spine. There are 5  lumbar type vertebral bodies. Intervertebral disc space are seen at L3-L4.  Mild anterior height loss of L1, L2, and L3 is similar to prior MRI. There  is multilevel degenerative disc disease throughout the lumbar spine.   Hardware is seen from left hip arthroplasty. 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   IMPRESSION:  Scoliosis. No definite acute changes. If there is continued clinical  concern, consider MRI for further evaluation.       Lumbar DG (Complete) 4+V:  Results for orders placed in visit on 09/13/01  DG Lumbar Spine Complete   Narrative FINDINGS CLINICAL DATA:  BACK PAIN. LUMBAR SPINE FIVE VIEWS PLAIN FILMS 09/14/01 FIVE LUMBAR TYPE VERTEBRAL BODIES ARE PRESENT.  THERE ARE THREADED INTERBODY CAGES AT L3-4.  THERE IS ADVANCED DEGENERATIVE DISC DISEASE AT L2-3 WITH DISC SPACE NARROWING AND OSTEOPHYTE FORMATION. THERE IS 4 MM RETROLISTHESIS OF L2 ON L3.  THIS INCREASED TO 8 MM IN EXTENSION AND FLEXION.  NO OTHER ABNORMALITY MOVEMENT IS SEEN.  THERE IS SOME DISC SPACE NARROWING AT L4-5 AS WELL.   VASCULAR CALCIFICATION IS PRESENT.  THERE IS MILD SCOLIOSIS CONVEX LEFT IN THE LUMBAR REGION. IMPRESSION 1.  DEGENERATIVE DISC DISEASE AS DESCRIBED. 2.  RETROLISTHESIS OF 4 MM AT L2-3, INCREASES TO 8 MM WITH FLEXION OR EXTENSION. THORACIC SPINE TWO VIEWS ADVANCED DEGENERATIVE [place] DISEASE IS SEEN AT T10-11 AND T11-12 WITH DISC  SPACE NARROWING AND OSTEOPHYTE FORMATION.  THERE IS A VERY MILD SCOLIOSIS CONVEX LEFT IN THE UPPER THORACIC REGION COMPENSATORY TO THE RIGHT AT THE THORACOLUMBAR JUNCTION.  THERE ARE NO PARASPINOUS MASSES SEEN. IMPRESSION 1.  LOWER THORACIC DEGENERATIVE DISC DISEASE AS DESCRIBED.   Lumbar DG Bending views:  Results for orders placed during the hospital encounter of 03/11/16  DG Lumbar Spine Complete W/Bend   Narrative CLINICAL DATA:  Low back pain. History of previous surgery. Numbness in the legs.  EXAM: LUMBAR SPINE - COMPLETE WITH BENDING VIEWS  COMPARISON:  Lumbar spine series of January 27, 2011  FINDINGS: There is a chronic levo curvature centered at the thoracolumbar junction. The lumbar vertebral bodies are reasonably well maintained in height but there has been further mild wedging of the bodies of L2 and L3 since 20/12. There is degenerative disc space narrowing at all lumbar levels with evidence of previous inter discal device placement at L3-4. There is no spondylolisthesis. As the patient moves from the flexed to the extended position no definite instability is observed. The observed portions of the sacrum are normal.  IMPRESSION: Multilevel degenerative disc disease throughout the lumbar spine. Prominent dextroscoliosis centered at the thoracolumbar junction. Increased anterior wedging of L2 and L3 since 2012.   Electronically Signed   By: David  Martinique M.D.   On: 03/11/2016 15:40    Sacroiliac Joint Imaging: Sacroiliac Joint DG:  Results for orders placed during the hospital encounter of 03/11/16  DG Si Joints   Narrative CLINICAL  DATA:  Chronic low back pain, history of previous surgery.  EXAM: BILATERAL SACROILIAC JOINTS - 3+ VIEW  COMPARISON:  Lumbar spine series dated January 27, 2011 and lumbar spine series of today's date.  FINDINGS: The bones are subjectively osteopenic. The SI joint spaces are preserved. No bony ankylosis is demonstrated. No erosive changes are observed.  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 There are degenerative changes of the lower lumbar spine with evidence of inter discal device placement at L3-4.  IMPRESSION: No acute or significant chronic bony abnormality of the SI joints.   Electronically Signed   By: David  Martinique M.D.   On: 03/11/2016 15:37    Note: Imaging results reviewed and explained to patient in Layman's terms.  Meds  The patient has a current medication list which includes the following prescription(s): alendronate, amoxicillin, aspirin, calcium carbonate, clopidogrel, cyclobenzaprine, diphenhydramine, gabapentin, levetiracetam, levothyroxine, metoprolol tartrate, multiple vitamins-minerals, paroxetine, proair hfa, ranolazine, vitamin c, and vitamin d (ergocalciferol).  Current Outpatient Prescriptions on File Prior to Visit  Medication Sig  . aspirin 81 MG tablet Take 81 mg by mouth daily.  . clopidogrel (PLAVIX) 75 MG tablet Take 75 mg by mouth daily.  . cyclobenzaprine (FLEXERIL) 10 MG tablet Take 10 mg by mouth 2 (two) times daily.   . diphenhydrAMINE (BENADRYL) 25 MG tablet Take 25 mg by mouth every 6 (six) hours as needed.  . gabapentin (NEURONTIN) 400 MG capsule Take 400 mg by mouth 3 (three) times daily.  Marland Kitchen levETIRAcetam (KEPPRA) 1000 MG tablet Take 1,500 mg by mouth 2 (two) times daily.   Marland Kitchen levothyroxine (SYNTHROID, LEVOTHROID) 100 MCG tablet Take 100 mcg by mouth daily before breakfast.  . metoprolol tartrate (LOPRESSOR) 25 MG tablet Take 25 mg by mouth 2 (two) times daily.  . Multiple Vitamins-Minerals (ICAPS AREDS 2 PO) Take 1 capsule by mouth 2  (two) times daily.  Marland Kitchen PARoxetine (PAXIL) 20 MG tablet Take 20 mg by mouth daily.  . ranolazine (  RANEXA) 500 MG 12 hr tablet Take 500 mg by mouth 2 (two) times daily.   No current facility-administered medications on file prior to visit.    ROS  Constitutional: Denies any fever or chills Gastrointestinal: No reported hemesis, hematochezia, vomiting, or acute GI distress Musculoskeletal: Denies any acute onset joint swelling, redness, loss of ROM, or weakness Neurological: No reported episodes of acute onset apraxia, aphasia, dysarthria, agnosia, amnesia, paralysis, loss of coordination, or loss of consciousness  Allergies  Ms. Rochette is allergic to keflex [cephalexin]; lamotrigine; macrodantin [nitrofurantoin macrocrystal]; nitrofurantoin; and statins.  PFSH  Drug: Ms. Dombrosky  reports that she does not use drugs. Alcohol:  reports that she does not drink alcohol. Tobacco:  reports that she quit smoking about 3 months ago. She smoked 0.50 packs per day. She has never used smokeless tobacco. Medical:  has a past medical history of Arthritis; Cardiomegaly; Cataract; COPD (chronic obstructive pulmonary disease) ([place]); Coronary artery disease; Depression; Diverticulitis; Epilepsy ([place]); Fibromyalgia; GERD (gastroesophageal reflux disease); HOH (hard of hearing); Hypertension; Hypothyroidism; Inflammatory bowel disease; Lupus; MRSA (methicillin resistant staph aureus) culture positive (1990's); Opioid dependence ([place]); Pain; Seizures ([place]); Spinal disorder; and Thyroid disease. Family: family history includes Cancer in her father; Heart disease in her mother.  Past Surgical History:  Procedure Laterality Date  . ABDOMINAL HYSTERECTOMY    . BACK SURGERY     [age]   cervical fusion/thoracic/lumbar with ray cages  . BREAST SURGERY     implants  . cardiac stents  2013   x 2  . CATARACT EXTRACTION W/PHACO Left 03/30/2016   Procedure: CATARACT EXTRACTION PHACO AND INTRAOCULAR LENS PLACEMENT (IOC);   Surgeon: Birder Robson, MD;  Location: [facility] ORS;  Service: Ophthalmology;  Laterality: Left;  [place] 00:45 AP% 19.6 CDE 8.93 Fluid pack lot # [id number]  . CATARACT EXTRACTION W/PHACO Right 04/27/2016   Procedure: CATARACT EXTRACTION PHACO AND INTRAOCULAR LENS PLACEMENT (IOC);  Surgeon: Birder Robson, MD;  Location: [facility] ORS;  Service: Ophthalmology;  Laterality: Right;  Lot #[id number] H [place]: 00:39.1 AP%: 17.3 CDE: 6.76  . CERVICAL SPINE SURGERY    . CORONARY ANGIOPLASTY     stent x2  . EYE SURGERY    . JOINT REPLACEMENT Left 2007   hip  . rotator cuff surg Right    Constitutional Exam  General appearance: Well nourished, well developed, and well hydrated. In no apparent acute distress Vitals:   05/17/16 1410  BP: 139/88  Pulse: 74  Temp: 98.6 F (37 C)  TempSrc: Oral  Weight: 115 lb (52.2 kg)  Height: '5\' 2"'$  (1.575 m)   BMI Assessment: Estimated body mass index is 21.03 kg/m as calculated from the following:   Height as of this encounter: '5\' 2"'$  (1.575 m).   Weight as of this encounter: 115 lb (52.2 kg).  BMI interpretation table: BMI level Category Range association with higher incidence of chronic pain  <18 kg/m2 Underweight   18.5-24.9 kg/m2 Ideal body weight   25-29.9 kg/m2 Overweight Increased incidence by 20%  30-34.9 kg/m2 Obese (Class I) Increased incidence by 68%  35-39.9 kg/m2 Severe obesity (Class II) Increased incidence by 136%  >40 kg/m2 Extreme obesity (Class III) Increased incidence by 254%   BMI Readings from Last 4 Encounters:  05/17/16 21.03 kg/m  04/27/16 20.12 kg/m  03/30/16 18.84 kg/m  02/17/16 18.29 kg/m   Wt Readings from Last 4 Encounters:  05/17/16 115 lb (52.2 kg)  04/27/16 110 lb (49.9 kg)  03/30/16 103 lb (46.7 kg)  02/17/16 100 lb (  45.4 kg)  Psych/Mental status: Alert, oriented x 3 (person, place, & time) Eyes: PERLA Respiratory: No evidence of acute respiratory distress  Cervical Spine Exam  Inspection: No masses, redness, or  swelling Alignment: Symmetrical Functional ROM: Unrestricted ROM Stability: No instability detected Muscle strength & Tone: Functionally intact Sensory: Unimpaired Palpation: Non-contributory  Upper Extremity (UE) Exam    Side: Right upper extremity  Side: Left upper extremity  Inspection: No masses, redness, swelling, or asymmetry  Inspection: No masses, redness, swelling, or asymmetry  Functional ROM: Unrestricted ROM         Functional ROM: Unrestricted ROM          Muscle strength & Tone: Functionally intact  Muscle strength & Tone: Functionally intact  Sensory: Unimpaired  Sensory: Unimpaired  Palpation: Non-contributory  Palpation: Non-contributory   Thoracic Spine Exam  Inspection: No masses, redness, or swelling Alignment: Asymmetric Functional ROM: Unrestricted ROM Stability: No instability detected Sensory: Unimpaired Muscle strength & Tone: Functionally intact Palpation: Non-contributory  Lumbar Spine Exam  Inspection: Well healed scar from previous spine surgery detected Alignment: Scoliosis detected Functional ROM: Decreased ROM Stability: No instability detected Muscle strength & Tone: Functionally intact Sensory: Movement-associated pain Palpation: Complains of area being tender to palpation Provocative Tests: Lumbar Hyperextension and rotation test: Positive bilaterally for facet joint pain. Patrick's Maneuver: Positive for bilateral S-I joint pain and for bilateral hip joint pain.  Gait & Posture Assessment  Ambulation: Limited Gait: Antalgic Posture: Antalgic   Lower Extremity Exam    Side: Right lower extremity  Side: Left lower extremity  Inspection: No masses, redness, swelling, or asymmetry  Inspection: No masses, redness, swelling, or asymmetry  Functional ROM: Unrestricted ROM          Functional ROM: Unrestricted ROM          Muscle strength & Tone: Functionally intact  Muscle strength & Tone: Functionally intact  Sensory: Unimpaired  Sensory:  Unimpaired  Palpation: Non-contributory  Palpation: Non-contributory   Assessment & Plan  Primary Diagnosis & Pertinent Problem List: The primary encounter diagnosis was Chronic pain syndrome. Diagnoses of Chronic low back pain 1ry (B) (R>L), Chronic neck pain 2ry (B) (R>L), Chronic upper back pain 3rd (B) (R>L), Failed back surgical syndrome (x7), and History of left hip replacement (Depue recalled replacement) were also pertinent to this visit.  Visit Diagnosis: 1. Chronic pain syndrome   2. Chronic low back pain 1ry (B) (R>L)   3. Chronic neck pain 2ry (B) (R>L)   4. Chronic upper back pain 3rd (B) (R>L)   5. Failed back surgical syndrome (x7)   6. History of left hip replacement (Depue recalled replacement)    Problems updated and reviewed during this visit: No problems updated. Problem-specific Plan(s): No problem-specific Assessment & Plan notes found for this encounter.  No new Assessment & Plan notes have been filed under this hospital service since the last note was generated. Service: Pain Management  Plan of Care   Problem List Items Addressed This Visit      High   Chronic low back pain (Location of Primary Source of Pain) (Bilateral) (R>L) (Chronic)   Chronic neck pain (Location of Secondary source of pain) (Bilateral) (R>L) (Chronic)   Chronic pain - Primary (Chronic)   Chronic upper back pain (Location of Tertiary source of pain) (Bilateral) (R>L) (Chronic)   Failed back surgical syndrome (x7) (Chronic)   History of left hip replacement (Depue recalled replacement) (Chronic)     Pharmacotherapy (Medications Ordered): No orders of  the defined types were placed in this encounter.  New Prescriptions   No medications on file   Medications administered during this visit: Ms. Notte had no medications administered during this visit. Lab-work, procedure(s), and/or referral(s): No orders of the defined types were placed in this encounter.  Imaging and/or  referral(s): None  Pharmacotherapy: Plan:  The patient has completely come off of her methadone. Because of her age, I do not believe her to be a good candidate to be on this medication again. However, based on today's evaluation, she seems to currently be managing without the opioids. Because of this reason, I will try to stay away from it.    Interventional therapies: Planned, scheduled, and/or pending:    None at this time. The patient has decided to think about her options. In addition, the patient is anticoagulated on Plavix and would have to stop it for 7-10 days prior to any interventions. The patient was recommended to have a conversation with her PCP to see if there was any possibility of the medication being switched to something like Elaquis that requires the patient to be off of it for only 3 days.    Considering:   Diagnostic caudal epidural steroid injection + diagnostic epidurogram under fluoroscopic guidance, with or without sedation.  Depending on the results, the patient may be a candidate for a Racz procedure.  Diagnostic bilateral lumbar facet blocks under fluoroscopic guidance and IV sedation. Because of the patient's extensive hardware, she is not likely to be a good candidate for radiofrequency.    PRN Procedures:   None at this time.    Provider-requested follow-up: Return if symptoms worsen or fail to improve, for (PRN) Procedure, after stopping blood thinner.  No future appointments.  Primary Care Physician: Casilda Carls Location: [facility] Outpatient Pain Management Facility Note by: Kathlen Brunswick. Dossie Arbour, M.D, DABA, DABAPM, DABPM, DABIPP, FIPP  Pain Score Disclaimer: We use the NRS-11 scale. This is a self-reported, subjective measurement of pain severity with only modest accuracy. It is used primarily to identify changes within a particular patient. It must be understood that outpatient pain scales are significantly less accurate that those used for research, where  they can be applied under ideal controlled circumstances with minimal exposure to variables. In reality, the score is likely to be a combination of pain intensity and pain affect, where pain affect describes the degree of emotional arousal or changes in action readiness caused by the sensory experience of pain. Factors such as social and work situation, setting, emotional state, anxiety levels, expectation, and prior pain experience may influence pain perception and show large inter-individual differences that may also be affected by time variables.  Patient instructions provided during this appointment: Patient Instructions   Facet Blocks Patient Information  Description: The facets are joints in the spine between the vertebrae.  Like any joints in the body, facets can become irritated and painful.  Arthritis can also effect the facets.  By injecting steroids and local anesthetic in and around these joints, we can temporarily block the nerve supply to them.  Steroids act directly on irritated nerves and tissues to reduce selling and inflammation which often leads to decreased pain.  Facet blocks may be done anywhere along the spine from the neck to the low back depending upon the location of your pain.   After numbing the skin with local anesthetic (like Novocaine), a small needle is passed onto the facet joints under x-ray guidance.  You may experience a sensation of  pressure while this is being done.  The entire block usually lasts about 15-25 minutes.   Conditions which may be treated by facet blocks:   Low back/buttock pain  Neck/shoulder pain  Certain types of headaches  Preparation for the injection:  1. Do not eat any solid food or dairy products within 8 hours of your appointment. 2. You may drink clear liquid up to 3 hours before appointment.  Clear liquids include water, black coffee, juice or soda.  No milk or cream please. 3. You may take your regular medication, including pain  medications, with a sip of water before your appointment.  Diabetics should hold regular insulin (if taken separately) and take 1/2 normal NPH dose the morning of the procedure.  Carry some sugar containing items with you to your appointment. 4. A driver must accompany you and be prepared to drive you home after your procedure. 5. Bring all your current medications with you. 6. An IV may be inserted and sedation may be given at the discretion of the physician. 7. A blood pressure cuff, EKG and other monitors will often be applied during the procedure.  Some patients may need to have extra oxygen administered for a short period. 8. You will be asked to provide medical information, including your allergies and medications, prior to the procedure.  We must know immediately if you are taking blood thinners (like Coumadin/Warfarin) or if you are allergic to IV iodine contrast (dye).  We must know if you could possible be pregnant.  Possible side-effects:   Bleeding from needle site  Infection (rare, may require surgery)  Nerve injury (rare)  Numbness & tingling (temporary)  Difficulty urinating (rare, temporary)  Spinal headache (a headache worse with upright posture)  Light-headedness (temporary)  Pain at injection site (serveral days)  Decreased blood pressure (rare, temporary)  Weakness in arm/leg (temporary)  Pressure sensation in back/neck (temporary)   Call if you experience:   Fever/chills associated with headache or increased back/neck pain  Headache worsened by an upright position  New onset, weakness or numbness of an extremity below the injection site  Hives or difficulty breathing (go to the emergency room)  Inflammation or drainage at the injection site(s)  Severe back/neck pain greater than usual  New symptoms which are concerning to you  Please note:  Although the local anesthetic injected can often make your back or neck feel good for several hours after  the injection, the pain will likely return. It takes 3-7 days for steroids to work.  You may not notice any pain relief for at least one week.  If effective, we will often do a series of 2-3 injections spaced 3-6 weeks apart to maximally decrease your pain.  After the initial series, you may be a candidate for a more permanent nerve block of the facets.  If you have any questions, please call #[phone]) [facility] Patient Information  Description: The epidural space surrounds the nerves as they exit the spinal cord.  In some patients, the nerves can be compressed and inflamed by a bulging disc or a tight spinal canal (spinal stenosis).  By injecting steroids into the epidural space, we can bring irritated nerves into direct contact with a potentially helpful medication.  These steroids act directly on the irritated nerves and can reduce swelling and inflammation which often leads to decreased pain.  Epidural steroids may be injected anywhere along the spine and from the neck to the low  back depending upon the location of your pain.   After numbing the skin with local anesthetic (like Novocaine), a small needle is passed into the epidural space slowly.  You may experience a sensation of pressure while this is being done.  The entire block usually last less than 10 minutes.  Conditions which may be treated by epidural steroids:   Low back and leg pain  Neck and arm pain  Spinal stenosis  Post-laminectomy syndrome  Herpes zoster (shingles) pain  Pain from compression fractures  Preparation for the injection:  1. Do not eat any solid food or dairy products within 8 hours of your appointment.  2. You may drink clear liquids up to 3 hours before appointment.  Clear liquids include water, black coffee, juice or soda.  No milk or cream please. 3. You may take your regular medication, including pain medications, with a sip of water  before your appointment  Diabetics should hold regular insulin (if taken separately) and take 1/2 normal NPH dos the morning of the procedure.  Carry some sugar containing items with you to your appointment. 4. A driver must accompany you and be prepared to drive you home after your procedure.  5. Bring all your current medications with your. 6. An IV may be inserted and sedation may be given at the discretion of the physician.   7. A blood pressure cuff, EKG and other monitors will often be applied during the procedure.  Some patients may need to have extra oxygen administered for a short period. 8. You will be asked to provide medical information, including your allergies, prior to the procedure.  We must know immediately if you are taking blood thinners (like Coumadin/Warfarin)  Or if you are allergic to IV iodine contrast (dye). We must know if you could possible be pregnant.  Possible side-effects:  Bleeding from needle site  Infection (rare, may require surgery)  Nerve injury (rare)  Numbness & tingling (temporary)  Difficulty urinating (rare, temporary)  Spinal headache ( a headache worse with upright posture)  Light -headedness (temporary)  Pain at injection site (several days)  Decreased blood pressure (temporary)  Weakness in arm/leg (temporary)  Pressure sensation in back/neck (temporary)  Call if you experience:  Fever/chills associated with headache or increased back/neck pain.  Headache worsened by an upright position.  New onset weakness or numbness of an extremity below the injection site  Hives or difficulty breathing (go to the emergency room)  Inflammation or drainage at the infection site  Severe back/neck pain  Any new symptoms which are concerning to you  Please note:  Although the local anesthetic injected can often make your back or neck feel good for several hours after the injection, the pain will likely return.  It takes 3-7 days for  steroids to work in the epidural space.  You may not notice any pain relief for at least that one week.  If effective, we will often do a series of three injections spaced 3-6 weeks apart to maximally decrease your pain.  After the initial series, we generally will wait several months before considering a repeat injection of the same type.  If you have any questions, please call [phone] [place]  What are the risk, side effects and possible complications? Generally speaking, most procedures are safe.  However, with any procedure there are risks, side effects, and the possibility of complications.  The risks and complications are dependent upon the  sites that are lesioned, or the type of nerve block to be performed.  The closer the procedure is to the spine, the more serious the risks are.  Great care is taken when placing the radio frequency needles, block needles or lesioning probes, but sometimes complications can occur. 1. Infection: Any time there is an injection through the skin, there is a risk of infection.  This is why sterile conditions are used for these blocks.  There are four possible types of infection. 1. Localized skin infection. 2. Central Nervous System Infection-This can be in the form of Meningitis, which can be deadly. 3. Epidural Infections-This can be in the form of an epidural abscess, which can cause pressure inside of the spine, causing compression of the spinal cord with subsequent paralysis. This would require an emergency surgery to decompress, and there are no guarantees that the patient would recover from the paralysis. 4. Discitis-This is an infection of the intervertebral discs.  It occurs in about 1% of discography procedures.  It is difficult to treat and it may lead to surgery.        2. Pain: the needles have to go through skin and soft tissues, will cause soreness.       3. Damage to  internal structures:  The nerves to be lesioned may be near blood vessels or    other nerves which can be potentially damaged.       4. Bleeding: Bleeding is more common if the patient is taking blood thinners such as  aspirin, Coumadin, Ticiid, Plavix, etc., or if he/she have some genetic predisposition  such as hemophilia. Bleeding into the spinal canal can cause compression of the spinal  cord with subsequent paralysis.  This would require an emergency surgery to  decompress and there are no guarantees that the patient would recover from the  paralysis.       5. Pneumothorax:  Puncturing of a lung is a possibility, every time a needle is introduced in  the area of the chest or upper back.  Pneumothorax refers to free air around the  collapsed lung(s), inside of the thoracic cavity (chest cavity).  Another two possible  complications related to a similar event would include: Hemothorax and Chylothorax.   These are variations of the Pneumothorax, where instead of air around the collapsed  lung(s), you may have blood or chyle, respectively.       6. Spinal headaches: They may occur with any procedures in the area of the spine.       7. Persistent CSF (Cerebro-Spinal Fluid) leakage: This is a rare problem, but may occur  with prolonged intrathecal or epidural catheters either due to the formation of a fistulous  track or a dural tear.       8. Nerve damage: By working so close to the spinal cord, there is always a possibility of  nerve damage, which could be as serious as a permanent spinal cord injury with  paralysis.       9. Death:  Although rare, severe deadly allergic reactions known as "Anaphylactic  reaction" can occur to any of the medications used.      10. Worsening of the symptoms:  We can always make thing worse.  What are the chances of something like this happening? Chances of any of this occuring are extremely low.  By statistics, you have more of a chance of getting killed in a motor  vehicle accident: while driving to the hospital than any  of the above occurring .  Nevertheless, you should be aware that they are possibilities.  In general, it is similar to taking a shower.  Everybody knows that you can slip, hit your head and get killed.  Does that mean that you should not shower again?  Nevertheless always keep in mind that statistics do not mean anything if you happen to be on the wrong side of them.  Even if a procedure has a 1 (one) in a 1,000,000 (million) chance of going wrong, it you happen to be that one..Also, keep in mind that by statistics, you have more of a chance of having something go wrong when taking medications.  Who should not have this procedure? If you are on a blood thinning medication (e.g. Coumadin, Plavix, see list of "Blood Thinners"), or if you have an active infection going on, you should not have the procedure.  If you are taking any blood thinners, please inform your physician.  How should I prepare for this procedure?  Do not eat or drink anything at least six hours prior to the procedure.  Bring a driver with you .  It cannot be a taxi.  Come accompanied by an adult that can drive you back, and that is strong enough to help you if your legs get weak or numb from the local anesthetic.  Take all of your medicines the morning of the procedure with just enough water to swallow them.  If you have diabetes, make sure that you are scheduled to have your procedure done first thing in the morning, whenever possible.  If you have diabetes, take only half of your insulin dose and notify our nurse that you have done so as soon as you arrive at the clinic.  If you are diabetic, but only take blood sugar pills (oral hypoglycemic), then do not take them on the morning of your procedure.  You may take them after you have had the procedure.  Do not take aspirin or any aspirin-containing medications, at least eleven (11) days prior to the procedure.  They may  prolong bleeding.  Wear loose fitting clothing that may be easy to take off and that you would not mind if it got stained with Betadine or blood.  Do not wear any jewelry or perfume  Remove any nail coloring.  It will interfere with some of our monitoring equipment.  NOTE: Remember that this is not meant to be interpreted as a complete list of all possible complications.  Unforeseen problems may occur.  BLOOD THINNERS The following drugs contain aspirin or other products, which can cause increased bleeding during surgery and should not be taken for 2 weeks prior to and 1 week after surgery.  If you should need take something for relief of minor pain, you may take acetaminophen which is found in Tylenol,m Datril, Anacin-3 and Panadol. It is not blood thinner. The products listed below are.  Do not take any of the products listed below in addition to any listed on your instruction sheet.  A.P.C or A.P.C with Codeine Codeine Phosphate Capsules #3 Ibuprofen Ridaura  ABC compound Congesprin Imuran rimadil  Advil Cope Indocin Robaxisal  Alka-Seltzer Effervescent Pain Reliever and Antacid Coricidin or Coricidin-D  Indomethacin Rufen  Alka-Seltzer plus Cold Medicine Cosprin Ketoprofen S-A-C Tablets  Anacin Analgesic Tablets or Capsules Coumadin Korlgesic Salflex  Anacin Extra Strength Analgesic tablets or capsules CP-2 Tablets Lanoril Salicylate  Anaprox Cuprimine Capsules Levenox Salocol  Anexsia-D Dalteparin Magan Salsalate  Anodynos Darvon  compound Magnesium Salicylate Sine-off  Ansaid Dasin Capsules Magsal Sodium Salicylate  Anturane Depen Capsules Marnal Soma  APF Arthritis pain formula Dewitt's Pills Measurin Stanback  Argesic Dia-Gesic Meclofenamic Sulfinpyrazone  Arthritis Bayer Timed Release Aspirin Diclofenac Meclomen Sulindac  Arthritis pain formula Anacin Dicumarol Medipren Supac  Analgesic (Safety coated) Arthralgen Diffunasal Mefanamic Suprofen  Arthritis Strength Bufferin  Dihydrocodeine Mepro Compound Suprol  Arthropan liquid Dopirydamole Methcarbomol with Aspirin Synalgos  ASA tablets/Enseals Disalcid Micrainin Tagament  Ascriptin Doan's Midol Talwin  Ascriptin A/D Dolene Mobidin Tanderil  Ascriptin Extra Strength Dolobid Moblgesic Ticlid  Ascriptin with Codeine Doloprin or Doloprin with Codeine Momentum Tolectin  Asperbuf Duoprin Mono-gesic Trendar  Aspergum Duradyne Motrin or Motrin IB Triminicin  Aspirin plain, buffered or enteric coated Durasal Myochrisine Trigesic  Aspirin Suppositories Easprin Nalfon Trillsate  Aspirin with Codeine Ecotrin Regular or Extra Strength Naprosyn Uracel  Atromid-S Efficin Naproxen Ursinus  Auranofin Capsules Elmiron Neocylate Vanquish  Axotal Emagrin Norgesic Verin  Azathioprine Empirin or Empirin with Codeine Normiflo Vitamin E  Azolid Emprazil Nuprin Voltaren  Bayer Aspirin plain, buffered or children's or timed BC Tablets or powders Encaprin Orgaran Warfarin Sodium  Buff-a-Comp Enoxaparin Orudis Zorpin  Buff-a-Comp with Codeine Equegesic Os-Cal-Gesic   Buffaprin Excedrin plain, buffered or Extra Strength Oxalid   Bufferin Arthritis Strength Feldene Oxphenbutazone   Bufferin plain or Extra Strength Feldene Capsules Oxycodone with Aspirin   Bufferin with Codeine Fenoprofen Fenoprofen Pabalate or Pabalate-SF   Buffets II Flogesic Panagesic   Buffinol plain or Extra Strength Florinal or Florinal with Codeine Panwarfarin   Buf-Tabs Flurbiprofen Penicillamine   Butalbital Compound Four-way cold tablets Penicillin   Butazolidin Fragmin Pepto-Bismol   Carbenicillin Geminisyn Percodan   Carna Arthritis Reliever Geopen Persantine   Carprofen Gold's salt Persistin   Chloramphenicol Goody's Phenylbutazone   Chloromycetin Haltrain Piroxlcam   Clmetidine heparin Plaquenil   Cllnoril Hyco-pap Ponstel   Clofibrate Hydroxy chloroquine Propoxyphen         Before stopping any of these medications, be sure to consult the  physician who ordered them.  Some, such as Coumadin (Warfarin) are ordered to prevent or treat serious conditions such as "deep thrombosis", "pumonary embolisms", and other heart problems.  The amount of time that you may need off of the medication may also vary with the medication and the reason for which you were taking it.  If you are taking any of these medications, please make sure you notify your pain physician before you undergo any procedures.

## 2016-05-17 NOTE — Progress Notes (Signed)
Safety precautions to be maintained throughout the outpatient stay will include: orient to surroundings, keep bed in low position, maintain call bell within reach at all times, provide assistance with transfer out of bed and ambulation.  

## 2016-08-30 ENCOUNTER — Ambulatory Visit: Payer: Self-pay | Admitting: Urology

## 2016-08-30 NOTE — Progress Notes (Deleted)
08/30/2016 1:14 PM   Diane Adkins May 19, 1946 [id number]  Referring provider: Casilda Carls [address] [place], [place] [address]  No chief complaint on file.   HPI: Patient is a 71 -year-old Caucasian female who presents today as a referral from their PCP, Dr. Rosario Jacks, for microscopic hematuria.    Patient was found to have microscopic hematuria on *** with *** RBC's/hpf.  Patient *** does/doesn't have a prior history of microscopic hematuria.    She/He does/ does not have a prior history of recurrent urinary tract infections, nephrolithiasis, trauma to the genitourinary tract, BPH or malignancies of the genitourinary tract. ***  She/He does/ does not have a prior history of recurrent urinary tract infections, nephrolithiasis, trauma to the genitourinary tract, BPH or malignancies of the genitourinary tract. ***  She/He does/does not have a family medical history of nephrolithiasis, malignancies of the genitourinary tract or hematuria. ***  She/He does/does not have a family medical history of nephrolithiasis, malignancies of the genitourinary tract or hematuria. ***  Today, she/he are having/not having symptoms of frequent urination, urgency, dysuria, nocturia, incontinence, hesitancy, intermittency, straining to urinate or a weak urinary stream.  Her/His UA today demonstrates ***.  Today, she/he are having/not having symptoms of frequent urination, urgency, dysuria, nocturia, incontinence, hesitancy, intermittency, straining to urinate or a weak urinary stream.  Her/His UA today demonstrates ***.  She/He is not experiencing any suprapubic pain, abdominal pain or flank pain. He/She denies any recent fevers, chills, nausea or vomiting. ***  She/He is not experiencing any suprapubic pain, abdominal pain or flank pain. He/She denies any recent fevers, chills, nausea or vomiting. ***  She/He have/have not had any recent imaging studies. ***  He/She are/are not a smoker. She/He is  a former smoker, with a*** ppd history.  Quit *** years ago.  They are/are not exposed to secondhand smoke.  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 ***   PMH: Past Medical History:  Diagnosis Date  . Arthritis    ra  . Cardiomegaly   . Cataract   . COPD (chronic obstructive pulmonary disease) ([place])   . Coronary artery disease   . Depression   . Diverticulitis   . Epilepsy ([place])   . Fibromyalgia   . GERD (gastroesophageal reflux disease)   . HOH (hard of hearing)    aids  . Hypertension   . Hypothyroidism   . Inflammatory bowel disease   . Lupus   . MRSA (methicillin resistant staph aureus) culture positive 1990's  . Opioid dependence ([place])   . Pain    chronic back and neck  . Seizures ([place])    last 2016  . Spinal disorder    stenosis  . Thyroid disease    hypothyroid    Surgical History: Past Surgical History:  Procedure Laterality Date  . ABDOMINAL HYSTERECTOMY    . BACK SURGERY     [age]   cervical fusion/thoracic/lumbar with ray cages  . BREAST SURGERY     implants  . cardiac stents  2013   x 2  . CATARACT EXTRACTION W/PHACO Left 03/30/2016   Procedure: CATARACT EXTRACTION PHACO AND INTRAOCULAR LENS PLACEMENT (IOC);  Surgeon: Birder Robson, MD;  Location: [facility] ORS;  Service: Ophthalmology;  Laterality: Left;  [place] 00:45 AP% 19.6 CDE 8.93 Fluid pack lot # [id number]  . CATARACT EXTRACTION W/PHACO Right 04/27/2016   Procedure: CATARACT EXTRACTION PHACO AND INTRAOCULAR LENS PLACEMENT (IOC);  Surgeon: Birder Robson, MD;  Location: [facility] ORS;  Service: Ophthalmology;  Laterality: Right;  Lot [id number] H [place]: 00:39.1 AP%: 17.3 CDE: 6.76  . CERVICAL SPINE SURGERY    . CORONARY ANGIOPLASTY     stent x2  . EYE SURGERY    . JOINT REPLACEMENT Left 2007   hip  . rotator cuff surg Right     Home Medications:  Allergies as of 08/30/2016      Reactions   Keflex [cephalexin] Other (See Comments)   Reaction: unknown   Lamotrigine Itching, Swelling    Macrodantin [nitrofurantoin Macrocrystal] Other (See Comments)   Reaction: unknown   Nitrofurantoin Hives   Statins Hives      Medication List       Accurate as of 08/30/16  1:14 PM. Always use your most recent med list.          alendronate 70 MG tablet Commonly known as:  FOSAMAX 70 mg once a week.   amoxicillin 500 MG capsule Commonly known as:  AMOXIL Take 2,000 mg by mouth as needed. FOR DENTAL APPOINTMENTS   aspirin 81 MG tablet Take 81 mg by mouth daily.   calcium carbonate 1250 MG capsule Take 1,250 mg by mouth 2 (two) times daily with a meal.   clopidogrel 75 MG tablet Commonly known as:  PLAVIX Take 75 mg by mouth daily.   cyclobenzaprine 10 MG tablet Commonly known as:  FLEXERIL Take 10 mg by mouth 2 (two) times daily.   diphenhydrAMINE 25 MG tablet Commonly known as:  BENADRYL Take 25 mg by mouth every 6 (six) hours as needed.   gabapentin 400 MG capsule Commonly known as:  NEURONTIN Take 400 mg by mouth 3 (three) times daily.   ICAPS AREDS 2 PO Take 1 capsule by mouth 2 (two) times daily.   levETIRAcetam 1000 MG tablet Commonly known as:  KEPPRA Take 1,500 mg by mouth 2 (two) times daily.   levothyroxine 100 MCG tablet Commonly known as:  SYNTHROID, LEVOTHROID Take 100 mcg by mouth daily before breakfast.   metoprolol tartrate 25 MG tablet Commonly known as:  LOPRESSOR Take 25 mg by mouth 2 (two) times daily.   PARoxetine 20 MG tablet Commonly known as:  PAXIL Take 20 mg by mouth daily.   PROAIR HFA 108 (90 Base) MCG/ACT inhaler Generic drug:  albuterol 2 puffs every 4 (four) hours as needed.   ranolazine 500 MG 12 hr tablet Commonly known as:  RANEXA Take 500 mg by mouth 2 (two) times daily.   vitamin C 500 MG tablet Commonly known as:  ASCORBIC ACID Take 500 mg by mouth 2 (two) times daily.   Vitamin D (Ergocalciferol) 50000 units Caps capsule Commonly known as:  DRISDOL 50,000 Units every 7 (seven) days.       Allergies:   Allergies  Allergen Reactions  . Keflex [Cephalexin] Other (See Comments)    Reaction: unknown  . Lamotrigine Itching and Swelling  . Macrodantin [Nitrofurantoin Macrocrystal] Other (See Comments)    Reaction: unknown  . Nitrofurantoin Hives  . Statins Hives    Family History: Family History  Problem Relation Age of Onset  . Heart disease Mother   . Cancer Father     Social History:  reports that she quit smoking about 6 months ago. She smoked 0.50 packs per day. She has never used smokeless tobacco. She reports that she does not drink alcohol or use drugs.  ROS:  Physical Exam: There were no vitals taken for this visit.  Constitutional: Well nourished. Alert and oriented, No acute distress. HEENT: [place] AT, moist mucus membranes. Trachea midline, no masses. Cardiovascular: No clubbing, cyanosis, or edema. Respiratory: Normal respiratory effort, no increased work of breathing. GI: Abdomen is soft, non tender, non distended, no abdominal masses. Liver and spleen not palpable.  No hernias appreciated.  Stool sample for occult testing is not indicated.   GU: No CVA tenderness.  No bladder fullness or masses.   Skin: No rashes, bruises or suspicious lesions. Lymph: No cervical or inguinal adenopathy. Neurologic: Grossly intact, no focal deficits, moving all 4 extremities. Psychiatric: Normal mood and affect.  Laboratory Data: Lab Results  Component Value Date   WBC 5.8 12/15/2015   HGB 13.2 12/15/2015   HCT 40.8 12/15/2015   MCV 90.8 12/15/2015   PLT 253 12/15/2015    Lab Results  Component Value Date   CREATININE 0.61 03/11/2016    Lab Results  Component Value Date   AST 16 03/11/2016   Lab Results  Component Value Date   ALT 12 (L) 03/11/2016     Urinalysis ***  Pertinent Imaging: ***  Assessment & Plan:  ***    No Follow-up on file.  These notes generated with voice recognition  software. I apologize for typographical errors.  Zara Council, [facility] [address], [place], [place] [address] [phone]

## 2016-10-19 DIAGNOSIS — M25519 Pain in unspecified shoulder: Secondary | ICD-10-CM | POA: Insufficient documentation

## 2016-12-08 ENCOUNTER — Telehealth: Payer: Self-pay | Admitting: Pain Medicine

## 2016-12-09 NOTE — Telephone Encounter (Signed)
Patient called to sched appt for procedure appt. Has been 6 months since she saw phys. Scheduled her for eval of pain for procedure.

## 2016-12-21 ENCOUNTER — Ambulatory Visit: Payer: Medicare Other | Admitting: Pain Medicine

## 2017-01-24 DIAGNOSIS — Z8619 Personal history of other infectious and parasitic diseases: Secondary | ICD-10-CM | POA: Insufficient documentation

## 2017-01-24 NOTE — Progress Notes (Signed)
Patient's Name: Diane Adkins  MRN: [id number]  Referring Provider: Casilda Carls, MD  DOB: 1946/02/10  PCP: Casilda Carls, MD  DOS: 01/25/2017  Note by: Gaspar Cola, MD  Service setting: Ambulatory outpatient  Specialty: Interventional Pain Management  Location: [facility] (AMB) Pain Management Facility    Patient type: Established   Primary Reason(s) for Visit: Evaluation of chronic illnesses with exacerbation, or progression (Level of risk: moderate) CC: Back Pain (lower) and Shoulder Pain (rt )  HPI  Diane Adkins is a 71 y.o. year old, female patient, who comes today for a follow-up evaluation. She has Altered mental status; Arteriosclerosis of coronary artery; Carpal tunnel syndrome (S/P Right side release) (Bilateral) (B) (R>L); Seizure ([place]); COPD (chronic obstructive pulmonary disease) ([place]); Dysphagia; Epilepsy ([place]); Acid reflux; H/O adenomatous polyp of colon; H/O infectious disease; Personal history of other infectious and parasitic diseases; HLD (hyperlipidemia); BP (high blood pressure); Hypothyroidism; Degeneration macular; Arthritis, degenerative; Presence of stent in coronary artery; Long term prescription benzodiazepine use; Long term current use of opiate analgesic; Long term prescription opiate use; Opiate use; Encounter for therapeutic drug level monitoring; Encounter for pain management planning; 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[place]); Chronic neck pain (Location of Secondary source of pain) (Bilateral) (R>L); Chronic low back pain (Location of Primary Source of Pain) (Bilateral) (R>L); Chronic upper back pain (Location of Tertiary source of pain) (Bilateral) (R>L); Chronic Arm numbness (Bilateral) (R>L); Bilateral numbness and tingling of arms and legs (R>L); Failed back surgical syndrome (x7); History of left hip replacement (Depue recalled replacement); MRSA (methicillin resistant Staphylococcus aureus); Lumbar spondylosis; Cervical spondylosis; History of Helicobacter pylori infection; Shoulder  pain; Chronic pain syndrome; Chronic thoracic spine pain; Numbness of lower extremity (B); and Long term current use of anticoagulant therapy on her problem list. Diane Adkins was last seen on 12/08/2016. Her primarily concern today is the Back Pain (lower) and Shoulder Pain (rt )  Pain Assessment: Location: Lower Back Radiating: radiates around to abd, will sometimes radiates down back of leg to knee Onset: More than a month ago Duration:   Quality: Burning, Aching, Sharp, Radiating Severity: 8 /10 (self-reported pain score)  Note: Reported level is compatible with observation.                   Effect on ADL: unable to stand strainght and leans over Timing: Constant Modifying factors: uses walker/cane  Further details on both, my assessment(s), as well as the proposed treatment plan, please see below.  Laboratory Chemistry  Inflammation Markers (CRP: Acute Phase) (ESR: Chronic Phase) Lab Results  Component Value Date   CRP <0.50 03/11/2016   ESRSEDRATE 11 03/11/2016                 Renal Function Markers Lab Results  Component Value Date   BUN 20 03/11/2016   CREATININE 0.61 03/11/2016   GFRAA >60 03/11/2016   GFRNONAA >60 03/11/2016                 Hepatic Function Markers Lab Results  Component Value Date   AST 16 03/11/2016   ALT 12 (L) 03/11/2016   ALBUMIN 4.2 03/11/2016   ALKPHOS 68 03/11/2016                 Electrolytes Lab Results  Component Value Date   NA 140 03/11/2016   K 4.2 03/11/2016   CL 105 03/11/2016   CALCIUM 9.4 03/11/2016   MG 1.9 03/11/2016  Neuropathy Markers Lab Results  Component Value Date   VITAMINB12 >7,500 (H) 03/11/2016                 Bone Pathology Markers Lab Results  Component Value Date   ALKPHOS 68 03/11/2016   25OHVITD1 33 03/11/2016   25OHVITD2 1.1 03/11/2016   25OHVITD3 32 03/11/2016   CALCIUM 9.4 03/11/2016                 Coagulation Parameters Lab Results  Component Value Date   PLT 253  12/15/2015                 Cardiovascular Markers Lab Results  Component Value Date   HGB 13.2 12/15/2015   HCT 40.8 12/15/2015                 Note: Lab results reviewed.  Recent Diagnostic Imaging Review  Cervical Imaging: Cervical MR wo contrast:  Results for orders placed in visit on 06/05/99  MR Cervical Spine Wo Contrast   Narrative FINDINGS CLINICAL DATA:  CERVICAL AND LUMBAR SPONDYLOSIS. MRI CERVICAL SPINE: ROUTINE IMAGES WERE PERFORMED.  COMPARISON WITH AN MRI FROM 05/26/1997. THERE HAS BEEN PRIOR FUSION OF C5 AND C6 VERTEBRAL BODIES.  THE ALIGNMENT IS NORMAL.  THE CORD HAS NORMAL SIGNAL. C3-4:  VERY SMALL CENTRAL DISK HERNIATION IS PRESENT, UNCHANGED. C4-5:  MILD TO MODERATE CENTRAL DIFFUSE DISK HERNIATION IS PRESENT, CAUSING FLATTENING OF THE CORD AND SPINAL STENOSIS.  THIS IS STABLE. C5-6:  POST FUSION LEVEL WITHOUT STENOSIS. C6-7:  THIS APPEARS TO BE A FUSION LEVEL AS WELL.  THERE IS NO SPINAL STENOSIS OR DISK HERNIATION. IMPRESSION STABLE CENTRAL DISK HERNIATION AT C3-4 WHICH IS VERY SMALL.  THERE IS ALSO STABLE, BROAD BASED, CENTRAL DISK HERNIATION AT C4-5 WITH MILD TO MODERATE SPINAL STENOSIS. NO NEW FINDINGS SINCE 11/98.   Cervical MR wo contrast:  Results for orders placed in visit on 02/19/09  MR [facility] W/O Cm   Narrative * PRIOR REPORT IMPORTED FROM AN EXTERNAL SYSTEM *   PRIOR REPORT IMPORTED FROM THE [facility] [place] EXAM:    unable to extend rt wrist and fingers, numbness in  outer  rt arm  COMMENTS:   PROCEDURE:     MR  - MR CERVICAL SPINE WO CONT  - Feb 20 2009  2:15PM   RESULT:   HISTORY: Right arm weakness, prior cervical spine surgery.   COMPARISON STUDIES:  MRI of cervical spine of 02/17/2007.   PROCEDURE AND FINDINGS:  Multiplanar/multisequence imaging of the cervical  spine is obtained.   The patient has undergone prior fusion from the C2-3 level to the C6-7  level. The previously identified anterolisthesis  at C6-7 has been  corrected.  Noted at C3-4 is mild retrolisthesis and this is most likely degenerative.  This is stable. Noted; however, at this level is a right paracentral small  disc protrusion which abuts the adjacent C4 nerve root. The nerve root is  not swollen. There is no evidence of fracture. There is no evidence of  high-grade spinal stenosis or cervical cord deformity. No high-grade  neural  foraminal narrowing is present. Artifact is present from the cervical  spine  fusion.   IMPRESSION:   1.  The patient has undergone prior cervical spine fusion with good  anatomic  alignment.  2.  Stable, mild retrolisthesis of C3 on C4 is noted. Noted; however, is a  new, small, right paracentral disc protrusion at  C3-4 resulting in mild  distortion of the right C4 nerve root. No nerve root swelling is noted. No  high-grade neural foraminal narrowing or spinal stenosis is noted. The  cervical cord is intact.   Thank you for this opportunity to contribute to the care of your patient.       Cervical MR w/wo contrast:  Results for orders placed in visit on 02/17/07  MR Cervical Spine W Wo Contrast   Narrative * PRIOR REPORT IMPORTED FROM AN EXTERNAL SYSTEM *   PRIOR REPORT IMPORTED FROM THE [facility] WORKFLOW SYSTEM   REASON FOR EXAM:    Radiculitis, right upper numbness  COMMENTS:   PROCEDURE:     MR  - MR CERVICAL SPINE WO/W  - Feb 17 2007 12:51PM  RESULT:     Multiplanar/multisequence imaging of the cervical spine is  obtained pre and post intravenous administration of 12 ml of IV Magnevist.   Evaluation of the cervical cord demonstrates no T1 or T2 signal  abnormalities. Post surgical changes are appreciated involving the C4, C5,  C6 and C7 vertebral bodies. Anterior metallic prosthetic devices,  consistent  with anterior fusion, are appreciated throughout these levels.   At the C2-3 level, end-plate hypertrophic spurring is appreciated causing  partial effacement of the  anterior CSF space with resulting asymmetric  mild  thecal narrowing. There does not appear to be evidence of exiting nerve  root  compression or compromise.   At the C3-4 level, end-plate hypertrophic spurring as well as a mild  broad-based disc bulge is appreciated. This too causes partial effacement  of  the anterior CSF space and there is resulting mild thecal sac narrowing.  Bilateral neural foraminal narrowing is appreciated, mild, LEFT greater  than  RIGHT. There does not appear to be evidence of exiting nerve root  compression or compromise.   At the C4-5 level, no significant thecal sac stenosis or exiting nerve  root  compromise is appreciated.   At the C5-6 level there does appear to be evidence of mild end-plate  hypertrophic spurring with partial effacement of the anterior CSF space.  No  evidence of significant exiting nerve root compression or thecal sac  stenosis is appreciated.   At the C6-7 level, there does not appear to be evidence of significant  thecal sac stenosis or exiting nerve root compression or compromise. Mild  partial effacement of the anterior CSF space is appreciated as well as  mild  thecal sac narrowing.   At the C7-T1 level, there is Grade I anterolisthesis. A broad-based disc  bulge is appreciated and these findings cause near complete effacement of  the anterior CSF space and moderate to mildly severe thecal sac stenosis.  There does appear to be bilateral neural foraminal narrowing and exiting  nerve root compromise is a diagnostic consideration bilaterally.   There are areas of increased T1 and T2 signal within the C6-7 vertebral  bodies as well as within the C2 vertebral body. Similar findings are also  appreciated within C5 and C6. These areas likely represent the sequelae of  fatty replacement of the marrow.   IMPRESSION:   1.     Area of moderate to severe thecal sac stenosis at the C7-T1 level  which appears to be secondary to  broad-based disc bulge as well as  anterolisthesis. This results in moderate to mildly severe thecal sac  stenosis as well as possible bilateral exiting nerve root compromise as  well  as possibly and  element of compression.  2.     Mild thecal sac narrowing is appreciated at C2-3 as well as at the  C5-6 levels. No evidence of exiting nerve root compression is appreciated  or  compromise. There does not appear to be evidence of regions of abnormal  enhancement within the soft tissues or evidence of regions of enhancing  epidural scarring resulting in thecal sac stenosis or exiting nerve root  compression or compromise.   Thank you for this opportunity to contribute to the care of your patient.       Cervical CT w contrast:  Results for orders placed during the hospital encounter of 11/09/07  CT Cervical Spine W Contrast   Narrative Clinical Data:  Neck pain, mid back pain, low back pain   MYELOGRAM CERVICAL AND THORACIC AND LUMBAR   CT MYELOGRAM CERVICAL AND THORACIC AND LUMBAR   Technique: Injection was performed by the neurosurgeon at L4.  10 ml of Omnipaque-300 was instilled in the subarachnoid space 322 gauge spinal needle.Following injection of intrathecal Omnipaque contrast, spine imaging in multiple projections was performed using fluoroscopy.   Comparison: Lumbar MRI 07/21/2007   Findings: There is a previous Ray cage fusion at L3-4.  There is mild stenosis at L4-5 with effacement of both L5 nerve roots, right worse than left.  There is moderate scoliosis with bony overgrowth noted, particularly in the lower thoracic region where there is been previous fusion.  Because of the scoliosis, and the lumbar degenerative change, it was difficult to maneuver the contrast into the thoracic and cervical regions for myelography.  The patient is transferred CT scan for CT myelogram evaluation   Fluoroscopy Time: 3.04 minutes   IMPRESSION: As above   CT MYELOGRAPHY CERVICAL  SPINE   Technique:  CT imaging of the cervical spine was performed after intrathecal contrast administration. Multiplanar CT image reconstructions were also generated.   Comparison: No prior MRI or CT scans available of the neck   Findings:  Previous cervical fusion with instrumentation.  There appears to be continuous fusion from C4-T1.  Alignment is anatomic except for mild anterolisthesis of C2 forward on C3 of approximate 2 mm.   C2-3: Mild anterolisthesis.  Mild annular bulging.  No stenosis or nerve root encroachment.   C3-4: Advanced disk space narrowing  with central osteophyte formation and vacuum disc phenomenon.  No frank disc protrusion, and no foraminal narrowing.   C4-5: Solid fusion, no residual stenosis.   C5-6: Solid fusion, no residual stenosis.   C6-7: Solid fusion.  Mild residual uncinate spurring and neural foraminal narrowing on the left with associated mild facet arthropathy.  Equivocal left  C7 nerve root encroachment is present.   C7-T1: solid fusion.  Hardware intact.  No residual stenosis or nerve root encroachment   IMPRESSION: Satisfactory post fusion appearance, with mild degenerative change at C3-4, C2-3.   No significant residual compression, hardware failure, or adjacent segment disease.   CT MYELOGRAPHY THORACIC SPINE   Technique: CT imaging of the thoracic spine was performed after intrathecal contrast administration.  Multiplanar CT image reconstructions were also generated.   Comparison: MRI 11/27/2005   Findings:  Mild scoliosis is present convex left in the upper thoracic region of approximate 20 degrees.  Previous fusion at T10- 11 and T11-12, apparently spontaneous.  Endplate sclerotic change is present, but no destructive lesions are seen and there is no subluxation at any level.   The individual disc spaces are examined as follows:    T1-2:  Normal.   T2-3:  Shallow central and leftward protrusion.  Possible right  T2 nerve root encroachment   T3-4:  Normal.   T4-5:  Shallow right protrusion, mildly flattening the cord in conjunction with scoliosis.  Right T4 nerve root encroachment may be present as well   T5-6:  Normal.   T6-7:  Calcified central protrusion, mildly flattening the cord and subarachnoid space.  No signet spinal stenosis however.   T7-8:  Shallow leftward protrusion, minimally flattening the cord. No compression of the exiting nerve root.   T8-9:  Tiny leftward protrusion, non compressive   T9-10:  Moderate sized soft disc protrusion centrally with disc space narrowing and vacuum disc phenomenon.  This does not compress the cord resolved spinal stenosis.   T10-11:  Tiny calcified protrusion, non compressive.   T11-12:  Solid fusion.  No disc pathology   Compared with prior MR there is little change.   IMPRESSION: Multiple thoracic disc protrusions as described, none of which appear result in significant cord compression or represent a significant interval change from 2007.   CT MYELOGRAPHY LUMBAR SPINE   Technique: CT imaging of the lumbar spine was performed after intrathecal contrast administration.  Multiplanar CT image reconstructions were also generated.   Comparison: Lumbar MRI 07/21/2007   Findings:  No prevertebral or paraspinous mass.  Minimal atherosclerotic calcification.   L1-2: Advanced disc space narrowing and shallow central protrusion. Asymmetric lateral recess encroachment left due to  the scoliotic deformity.  Left L2 nerve root encroachment suspected.  No stenosis of the canal. Left foraminal narrowing compresses the L1 nerve root   L2-3: Central and leftward protrusion associated with mild scoliosis.  Left L3 nerve root encroachment present. Left L2 nerve root compression is seen in the foramen.   L3-4: Satisfactory Ray cage fusion appears solid.  No residual compression.  Wide hemilaminectomy defect, left and right.   L4-5: Shallow  central protrusion with posterior element hypertrophy.  Asymmetric foraminal narrowing on the right due to uncinate spurring.  Mild right L4 and right L5 nerve root encroachment are present.   L5-S1: Mild bulge.  Moderate facet arthropathy.  No compression.   There appears to be little change compared to prior MR.   IMPRESSION: Central and leftward protrusions at L1-2 and L2-3 with severe disc space narrowing and facet overgrowth; left L2 and left L3 encroachment observed; there is also asymmetric neural foraminal narrowing at these levels.   Solid fusion L3-4  Provider: Bonna Gains   Cervical DG 1 view:  Results for orders placed in visit on 03/02/00  DG Cervical Spine 1 View   Narrative FINDINGS CLINICAL:  POST-FUSION. CERVICAL SPINE ONE VIEW: COMPARISON 08/28/99. POST C4-5 AND C5-6 INTERBODY FUSION WITH ANTERIOR SCREWPLATING.  NO CHANGE IN POSITION OR ALIGNMENT OF THE BONE STRUTS OR HARDWARE. PRIOR FUSION AT C6-7 AS BEFORE. IMPRESSION POST-OPERATIVE CHANGES AS DESCRIBED ABOVE - NO SIGNIFICANT INTERVAL CHANGE COMPARED TO 08/28/99.   Cervical DG 2-3 views:  Results for orders placed during the hospital encounter of 05/09/07  DG Cervical Spine 2-3 Views   Narrative Clinical Data: 71 year-old female, thoracic spondylosis with myelopathy. C7-T1 ACDF. INTRAOPERATIVE CERVICAL SPINE - 3 VIEW: Findings: Image 1 is a lateral view at 1120 hours of the cervical spine from C-2 through C-6.  There is anterior plate and screw fixation from C-4 to 6.  There appears to be bony fusion at C6-7 as well. The patient is intubated. Image 2 is performed at 1125 hours. The plate and  screw fixation from C-4 to 6 is redemonstrated.  Two surgical probes are now in place.  The superior tip is directed at C7-T1 inferior and is directed likely at the T-1 vertebral body although bony detail is obscured. Final image is submitted from 1215 hours.  There is new surgical hardware seen at the C-7 level that  presumably extends inferiorly.  This cannot be resolved due to the overlying soft tissues. Of note there is slight retrolisthesis of C-3 on 4 on all images. IMPRESSION: 1.  Status post prior ACDF C4-6. 2.  Bony fusion at C6-7. 3.  The final image demonstrates the superior aspect of the plate and screw fixation which begins at C-7.  Provider: Cheral Almas   Cervical DG Bending/F/E views:  Results for orders placed in visit on 09/13/01  DG Cervical Spine With Flex & Extend   Narrative FINDINGS CLINICAL DATA:  PAIN. CERVICAL SPINE, PLAIN FILMS, SEVEN VIEWS OF THE CERVICAL SPINE LATERAL VIEW OF THE CERVICAL SPINE DEMONSTRATES PREVIOUS FUSION FROM C4 THOUGH C7.  THERE IS A SOLID INTERBODY FUSION AT C6-7.   THERE HAS  BEEN PREVIOUS INTERBODY FUSION WITH ANTERIOR PLATING FROM C4 THROUGH C6. FLEXION AND EXTENSION SHOWS NO ABNORMAL MOVEMENT MEASURED FROM THE SPINOUS PROCESS OF C7 TO THE SPINOUS PROCESS OF C4. AP VIEWS SHOW THE HARDWARE CENTERED IN THE MIDLINE AND GROSSLY INTACT. [place]BL[place]QUE VIEWS SHOW SOME FORAMINAL NARROWING AT C6-7 ON THE LEFT. IMPRESSION SOLID FUSION, C4-C7. BONY FORAMINAL NARROWING, C6-7,  LEFT. NO ABNORMAL MOVEMENT BETWEEN FLEXION AND EXTENSION. MRI CERVICAL SPINE MULTIPLANAR T1- AND T2-WEIGHTED IMAGES ARE OBTAINED PRE AND POST CONTRAST.  THERE HAS BEEN PREVIOUS FUSION FROM C4 THROUGH C7 WITH ANTERIOR PLATING AT C4-5-6.  THE ALIGNMENT IS SATISFACTORY. THERE IS A BROAD-BASED CENTRAL DISC PROTRUSION AT C3-4 WITH EFFACEMENT OF THE ANTERIOR SUBARACHNOID SPACE BUT NO CORD COMPRESSION.  THERE IS NO ABNORMAL CORD SIGNAL SEEN AT C3-4.  NO C4 NERVE ROOT ENCROACHMENT IS EVIDENT. THERE IS CONSIDERABLE ARTIFACTUAL SIGNAL LOSS AT C4 AND BELOW.  THERE DOES APPEAR TO BE BONY FORAMINAL NARROWING AT C6-7 ON THE LEFT AS IDENTIFIED FROM PLAIN FILMS.  WHEN COMPARED WITH PREVIOUS MRI OF THE CERVICAL SPINE FROM 06/04/01, THE DISC PROTRUSION AT C3-4 IS CONSIDERABLY  LARGER. FOLLOWING ADMINISTRATION OF CONTRAST, THERE IS NO ABNORMAL ENHANCEMENT. IMPRESSION STATUS POST FUSION, C4-C7. CENTRAL HNP, C3-4 WITH EFFACEMENT OF THE ANTERIOR SUBARACHNOID SPACE BUT NO CORD COMPRESSION. BONY FORAMINAL NARROWING AT C6-7, LEFT. MRI THORACIC SPINE MULTIPLANAR T1- AND T2-WEIGHTED IMAGES ARE OBTAINED PRE AND POST CONTRAST.  THERE IS ADVANCED DEGENERATIVE DISC DISEASE AT T11-12 WHERE THERE IS NEAR COMPLETE LOSS OF INTERSPACE HEIGHT. SIMILAR LESS SEVERE CHANGES ARE SEEN AT T9-10 AND T10-11.  VERTEBRAL BODY ALIGNMENT IS SATISFACTORY, ALTHOUGH THERE IS MILD KYPHOTIC ANGULATION AT T11-12 DUE TO MILD COMPRESSION OF THE T12 VERTEBRA.  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 AT T9-10, THERE IS A FAIRLY PROMINENT DISC PROTRUSION, CENTRAL AND TO THE LEFT.  THERE IS EFFACEMENT OF THE ANTERIOR SUBARACHNOID SPACE BUT NO DEFINITE CORD FLATTENING.  AT T8-9, THERE IS A SMALL CENTRAL PROTRUSION. WHEN COMPARED WITH THE PREVIOUS STUDY FROM 09/03/99, THERE IS LITTLE CHANGE. FOLLOWING ADMINISTRATION OF CONTRAST, THERE IS NO ABNORMAL ENHANCEMENT OF THE THORACIC SPINE OR SURROUNDING STRUCTURES. 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 MODERATE SIZE DISC PROTRUSION, T9-10  CENTRAL AND TO THE LEFT  ALONG WITH A SMALLER DISC PROTRUSION AT T8-9 CENTRALLY. THERE IS NO SIGNIFICANT INTERVAL CHANGE WHEN COMPARED WITH 09/03/99. MRI LUMBAR SPINE MULTIPLANAR T1- AND T2-WEIGHTED IMAGES ARE OBTAINED PRE AND POST CONTRAST. THE PATIENT HAS UNDERGONE PREVIOUS PLACEMENT OF THREADED INTERBODY CAGES AT L3-4.  THE CAGES APPEAR TO BE IN GOOD POSITION.  THE ALIGNMENT IS SATISFACTORY.  THERE IS ENHANCEMENT OF THE END PLATES ABOVE AND BELOW, L3-4 AN EXPECTED POST-OP FINDING.  THERE IS ADVANCED DEGENERATIVE CHANGES AT L2-3 WITH ENHANCEMENT OF END PLATES ABOVE AND BELOW L2-3,  REFLECTING ACTIVE ONGOING  DEGENERATIVE CHANGE.  THE CONUS MEDULLARIS IS WITHIN NORMAL LIMITS AND THE INDIVIDUAL DISC SPACES ARE EXAMINED AS FOLLOWS. L1-2:  MILD CENTRAL PROTRUSION. NO ROOT CUTOFF OR STENOSIS. L2-3:  BROAD-BASED DISC PROTRUSION, CENTRAL AND TO THE LEFT.  LEFT L2 AND L3 NERVE ROOT ENCROACHMENT IS PRESENT. L3-4:  SATISFACTORY POST-OPERATIVE APPEARANCE. L4-5:  SMALL CENTRAL PROTRUSION.  NO ROOT CUTOFF OR SPINAL STENOSIS. MARKED POSTERIOR ELEMENT HYPERTROPHY IS SEEN. L5-S1:  MARKED POSTERIOR ELEMENT HYPERTROPHY. NO STENOSIS OR DISC PROTRUSION. COMPARED WITH 04/08/00, THE DISC PROTRUSIONS AT L1-2 AND L2-3 HAVE DECREASED SOMEWHAT IN SIZE.   IN ADDITION THE DISC PROTRUSION AT L4-5 HAS ALSO DECREASED IN SIZE. IMPRESSION MODERATE DISC PROTRUSION, L2-3, CENTRAL AND TO THE LEFT WITH LEFT L2 AND LEFT L3 NERVE ROOT ENCROACHMENT. SMALLER CENTRAL PROTRUSIONS OF L1-2 AND L4-5. STATUS POST THREADED INTERBODY CAGES, L3-4. COMPARED WITH THE PREVIOUS STUDY FROM 04/08/00, THE DISC PROTRUSIONS HAVE ALL DECREASED SLIGHTLY IN SIZE.  Provider: Margaretmary Eddy   Cervical DG complete:  Results for orders placed during the hospital encounter of 03/11/16  DG Cervical Spine Complete   Narrative CLINICAL DATA:  71 year old female with chronic spine pain. Prior cervical thoracic and lumbar spine surgeries. Scoliosis. Subsequent encounter.  EXAM: CERVICAL SPINE - COMPLETE 4+ VIEW  COMPARISON:  Cervical spine CT 01/27/2011 and earlier.  FINDINGS: Sequelae of C4-C5 through C7-T1 ACDF. Hardware remains in place from the C4-C6, and at the C7 and T1 levels. Hardware appears intact and continued solid arthrodesis to the C7 level is suspected. There may be continued nonunion at C7-T1.  Adjacent segment disease at C3-C4 with severe disc space loss, retrolisthesis, and bulky endplate osteophytosis. Trace anterolisthesis of C2 on C3 appears stable. Bilateral posterior element alignment is within normal  limits. C1-C2 alignment preserved. Negative visualized lung apices. Partially visible dextro convex upper thoracic scoliosis. Calcified cervical carotid atherosclerosis.  IMPRESSION: Extensive prior cervical spine fusion. 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 Pseudarthrosis suspected at C7-T1. Repeat cervical spine CT without contrast would evaluate further as necessary.   Electronically Signed   By: Genevie Ann M.D.   On: 03/11/2016 15:38    Shoulder Imaging: Shoulder-R DG:  Results for orders placed in visit on 01/26/11  DG Shoulder Right   Narrative * PRIOR REPORT IMPORTED FROM AN EXTERNAL SYSTEM *   PRIOR REPORT IMPORTED FROM THE [facility] WORKFLOW SYSTEM   REASON FOR EXAM:    pain  COMMENTS:   PROCEDURE:     DXR - DXR SHOULDER RIGHT COMPLETE  - Jan 27 2011  3:09AM   RESULT:     Comparison: None.   Findings:  No acute fracture or dislocation. Post surgical changes seen from prior  distal clavicle resection. Small round lucency overlying the superior  lateral aspect of the humeral head likely secondary to subcortical cystic  change at the insertion of the supraspinatus.   IMPRESSION:  No acute fracture or dislocation.  Thoracic Imaging: Thoracic MR wo contrast:  Results for orders placed during the hospital encounter of 11/27/05  MR Thoracic Spine Wo Contrast   Narrative Clinical data: Severe back pain. Myelopathy.   MRI OF THORACIC SPINE WITHOUT CONTRAST:  Technique: Multiplanar and multiecho pulse sequences of the thoracic spine were obtained according to standard protocol without IV contrast.  Findings: Counting of the levels is difficult in this case due to cervical fusion and difficulty counting the cervical vertebrae. I believe I have the numbering accurate; however, it could be off by a level.   There is scoliosis convex to the right in the lower thoracic spine and convex to the left in the upper thoracic spine. There is no fracture. Hemangioma is noted in  the T9 vertebral body. There is partial fusion of the T11 and T12 vertebra on the left which is probably due to disc degeneration. No fracture or mass lesion is identified  T2-3: Mild disc degeneration.   T3-4: Negative.   T4-5: Right sided osteophyte and disc are present with flattening of the cord on the right.   T5-6: Negative.   T6-7: Central disc protrusion is present with mild flattening of the ventral cord in the midline.   T7-8: Small left sided disc protrusion with flattening of the left side of the cord.   T8-9: Small left sided disc protrusion.   T9-10: Small left sided disc protrusion without significant cord deformity.   T10-11: Right sided osteophyte is present with mild flattening of the cord.   T11-12: Partial interbody fusion which is probably degenerative.  T12-L1: Small central and right sided disc protrusion without stenosis.   L1-2: Central disc protrusion with some associated spurring.   IMPRESSION:  1. Multilevel disc degeneration and spondylosis. Multiple small disc protrusions are present. There is no fracture or mass.   2. Scoliosis.  Provider: Sedonia Small   Thoracic MR w/wo contrast:  Results for orders placed in visit on 09/13/01  MR Thoracic Spine W Wo Contrast   Narrative FINDINGS CLINICAL DATA:  PAIN. CERVICAL SPINE, PLAIN FILMS, SEVEN VIEWS OF THE CERVICAL SPINE LATERAL VIEW OF THE CERVICAL SPINE DEMONSTRATES PREVIOUS FUSION FROM C4 THOUGH C7.  THERE IS A SOLID INTERBODY FUSION AT C6-7.   THERE HAS  BEEN PREVIOUS INTERBODY FUSION WITH ANTERIOR PLATING FROM C4 THROUGH C6. FLEXION AND EXTENSION SHOWS NO ABNORMAL MOVEMENT MEASURED FROM THE SPINOUS PROCESS OF C7 TO THE SPINOUS PROCESS OF C4. AP VIEWS SHOW THE HARDWARE CENTERED IN THE MIDLINE AND GROSSLY INTACT. [place] VIEWS SHOW SOME FORAMINAL NARROWING AT C6-7 ON THE LEFT. IMPRESSION SOLID FUSION, C4-C7. BONY FORAMINAL NARROWING, C6-7,  LEFT. NO ABNORMAL MOVEMENT BETWEEN FLEXION AND EXTENSION. MRI  CERVICAL SPINE MULTIPLANAR T1- AND T2-WEIGHTED IMAGES ARE OBTAINED PRE AND POST CONTRAST.  THERE HAS BEEN PREVIOUS FUSION FROM C4 THROUGH C7 WITH ANTERIOR PLATING AT C4-5-6.  THE ALIGNMENT IS SATISFACTORY. THERE IS A BROAD-BASED CENTRAL DISC PROTRUSION AT C3-4 WITH EFFACEMENT OF THE ANTERIOR SUBARACHNOID SPACE BUT NO CORD COMPRESSION.  THERE IS NO ABNORMAL CORD SIGNAL SEEN AT C3-4.  NO C4 NERVE ROOT ENCROACHMENT IS EVIDENT. THERE IS CONSIDERABLE ARTIFACTUAL SIGNAL LOSS AT C4 AND BELOW.  THERE DOES APPEAR TO BE BONY FORAMINAL NARROWING AT C6-7 ON THE LEFT AS IDENTIFIED FROM PLAIN FILMS.  WHEN COMPARED WITH PREVIOUS MRI OF THE CERVICAL SPINE FROM 06/04/01, THE DISC PROTRUSION AT C3-4 IS CONSIDERABLY LARGER. FOLLOWING ADMINISTRATION OF CONTRAST, THERE IS NO ABNORMAL ENHANCEMENT. IMPRESSION STATUS POST FUSION, C4-C7. CENTRAL HNP, C3-4 WITH EFFACEMENT  OF THE ANTERIOR SUBARACHNOID SPACE BUT NO CORD COMPRESSION. BONY FORAMINAL NARROWING AT C6-7, LEFT. MRI THORACIC SPINE MULTIPLANAR T1- AND T2-WEIGHTED IMAGES ARE OBTAINED PRE AND POST CONTRAST.  THERE IS ADVANCED DEGENERATIVE DISC DISEASE AT T11-12 WHERE THERE IS NEAR COMPLETE LOSS OF INTERSPACE HEIGHT. SIMILAR LESS SEVERE CHANGES ARE SEEN AT T9-10 AND T10-11.  VERTEBRAL BODY ALIGNMENT IS SATISFACTORY, ALTHOUGH THERE IS MILD KYPHOTIC ANGULATION AT T11-12 DUE TO MILD COMPRESSION OF THE T12 VERTEBRA.  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 AT T9-10, THERE IS A FAIRLY PROMINENT DISC PROTRUSION, CENTRAL AND TO THE LEFT.  THERE IS EFFACEMENT OF THE ANTERIOR SUBARACHNOID SPACE BUT NO DEFINITE CORD FLATTENING.  AT T8-9, THERE IS A SMALL CENTRAL PROTRUSION. WHEN COMPARED WITH THE PREVIOUS STUDY FROM 09/03/99, THERE IS LITTLE CHANGE. FOLLOWING ADMINISTRATION OF CONTRAST, THERE IS NO ABNORMAL ENHANCEMENT OF THE THORACIC SPINE OR SURROUNDING STRUCTURES. 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 MODERATE SIZE DISC PROTRUSION, T9-10  CENTRAL AND TO THE LEFT ALONG WITH A SMALLER [place] PROTRUSION AT T8-9 CENTRALLY. THERE IS NO SIGNIFICANT INTERVAL CHANGE WHEN COMPARED WITH 09/03/99. MRI LUMBAR SPINE MULTIPLANAR T1- AND T2-WEIGHTED IMAGES ARE OBTAINED PRE AND POST CONTRAST. THE PATIENT HAS UNDERGONE PREVIOUS PLACEMENT OF THREADED INTERBODY CAGES AT L3-4.  THE CAGES APPEAR TO BE IN GOOD POSITION.  THE ALIGNMENT IS SATISFACTORY.  THERE IS ENHANCEMENT OF THE END PLATES ABOVE AND BELOW, L3-4 AN EXPECTED POST-OP FINDING.  THERE IS ADVANCED DEGENERATIVE CHANGES AT L2-3 WITH ENHANCEMENT OF END PLATES ABOVE AND BELOW L2-3, REFLECTING ACTIVE ONGOING  DEGENERATIVE CHANGE.  THE CONUS MEDULLARIS IS WITHIN NORMAL LIMITS AND THE INDIVIDUAL DISC SPACES ARE EXAMINED AS FOLLOWS. L1-2:  MILD CENTRAL PROTRUSION. NO ROOT CUTOFF OR STENOSIS. L2-3:  BROAD-BASED DISC PROTRUSION, CENTRAL AND TO THE LEFT.  LEFT L2 AND L3 NERVE ROOT ENCROACHMENT IS PRESENT. L3-4:  SATISFACTORY POST-OPERATIVE APPEARANCE. L4-5:  SMALL CENTRAL PROTRUSION.  NO ROOT CUTOFF OR SPINAL STENOSIS. MARKED POSTERIOR ELEMENT HYPERTROPHY IS SEEN. L5-S1:  MARKED POSTERIOR ELEMENT HYPERTROPHY. NO STENOSIS OR DISC PROTRUSION. COMPARED WITH 04/08/00, THE DISC PROTRUSIONS AT L1-2 AND L2-3 HAVE DECREASED SOMEWHAT IN SIZE.   IN ADDITION THE DISC PROTRUSION AT L4-5 HAS ALSO DECREASED IN SIZE. IMPRESSION MODERATE DISC PROTRUSION, L2-3, CENTRAL AND TO THE LEFT WITH LEFT L2 AND LEFT L3 NERVE ROOT ENCROACHMENT. SMALLER CENTRAL PROTRUSIONS OF L1-2 AND L4-5. STATUS POST THREADED INTERBODY CAGES, L3-4. COMPARED WITH THE PREVIOUS STUDY FROM 04/08/00, THE DISC PROTRUSIONS HAVE ALL DECREASED SLIGHTLY IN SIZE.  Provider: Margaretmary Eddy   Thoracic CT w contrast:  Results for orders placed during the hospital encounter of 11/09/07  CT Thoracic Spine W Contrast   Narrative Clinical  Data:  Neck pain, mid back pain, low back pain   MYELOGRAM CERVICAL AND THORACIC AND LUMBAR   CT MYELOGRAM CERVICAL AND THORACIC AND LUMBAR   Technique: Injection was performed by the neurosurgeon at L4.  10 ml of Omnipaque-300 was instilled in the subarachnoid space 322 gauge spinal needle.Following injection of intrathecal Omnipaque contrast, spine imaging in multiple projections was performed using fluoroscopy.   Comparison: Lumbar MRI 07/21/2007   Findings: There is a previous Ray cage fusion at L3-4.  There is mild stenosis at L4-5 with effacement of both L5 nerve roots, right worse than left.  There is moderate scoliosis with bony overgrowth noted, particularly in the lower thoracic  region where there is been previous fusion.  Because of the scoliosis, and the lumbar degenerative change, it was difficult to maneuver the contrast into the thoracic and cervical regions for myelography.  The patient is transferred CT scan for CT myelogram evaluation   Fluoroscopy Time: 3.04 minutes   IMPRESSION: As above   CT MYELOGRAPHY CERVICAL SPINE   Technique:  CT imaging of the cervical spine was performed after intrathecal contrast administration. Multiplanar CT image reconstructions were also generated.   Comparison: No prior MRI or CT scans available of the neck   Findings:  Previous cervical fusion with instrumentation.  There appears to be continuous fusion from C4-T1.  Alignment is anatomic except for mild anterolisthesis of C2 forward on C3 of approximate 2 mm.   C2-3: Mild anterolisthesis.  Mild annular bulging.  No stenosis or nerve root encroachment.   C3-4: Advanced disk space narrowing  with central osteophyte formation and vacuum disc phenomenon.  No frank disc protrusion, and no foraminal narrowing.   C4-5: Solid fusion, no residual stenosis.   C5-6: Solid fusion, no residual stenosis.   C6-7: Solid fusion.  Mild residual uncinate spurring and  neural foraminal narrowing on the left with associated mild facet arthropathy.  Equivocal left  C7 nerve root encroachment is present.   C7-T1: solid fusion.  Hardware intact.  No residual stenosis or nerve root encroachment   IMPRESSION: Satisfactory post fusion appearance, with mild degenerative change at C3-4, C2-3.   No significant residual compression, hardware failure, or adjacent segment disease.   CT MYELOGRAPHY THORACIC SPINE   Technique: CT imaging of the thoracic spine was performed after intrathecal contrast administration.  Multiplanar CT image reconstructions were also generated.   Comparison: MRI 11/27/2005   Findings:  Mild scoliosis is present convex left in the upper thoracic region of approximate 20 degrees.  Previous fusion at T10- 11 and T11-12, apparently spontaneous.  Endplate sclerotic change is present, but no destructive lesions are seen and there is no subluxation at any level.   The individual disc spaces are examined as follows:    T1-2: Normal.   T2-3:  Shallow central and leftward protrusion.  Possible right T2 nerve root encroachment   T3-4:  Normal.   T4-5:  Shallow right protrusion, mildly flattening the cord in conjunction with scoliosis.  Right T4 nerve root encroachment may be present as well   T5-6:  Normal.   T6-7:  Calcified central protrusion, mildly flattening the cord and subarachnoid space.  No signet spinal stenosis however.   T7-8:  Shallow leftward protrusion, minimally flattening the cord. No compression of the exiting nerve root.   T8-9:  Tiny leftward protrusion, non compressive   T9-10:  Moderate sized soft disc protrusion centrally with disc space narrowing and vacuum disc phenomenon.  This does not compress the cord resolved spinal stenosis.   T10-11:  Tiny calcified protrusion, non compressive.   T11-12:  Solid fusion.  No disc pathology   Compared with prior MR there is little change.    IMPRESSION: Multiple thoracic disc protrusions as described, none of which appear result in significant cord compression or represent a significant interval change from 2007.   CT MYELOGRAPHY LUMBAR SPINE   Technique: CT imaging of the lumbar spine was performed after intrathecal contrast administration.  Multiplanar CT image reconstructions were also generated.   Comparison: Lumbar MRI 07/21/2007   Findings:  No prevertebral or paraspinous mass.  Minimal atherosclerotic calcification.   L1-2: Advanced disc space narrowing and  shallow central protrusion. Asymmetric lateral recess encroachment left due to  the scoliotic deformity.  Left L2 nerve root encroachment suspected.  No stenosis of the canal. Left foraminal narrowing compresses the L1 nerve root   L2-3: Central and leftward protrusion associated with mild scoliosis.  Left L3 nerve root encroachment present. Left L2 nerve root compression is seen in the foramen.   L3-4: Satisfactory Ray cage fusion appears solid.  No residual compression.  Wide hemilaminectomy defect, left and right.   L4-5: Shallow central protrusion with posterior element hypertrophy.  Asymmetric foraminal narrowing on the right due to uncinate spurring.  Mild right L4 and right L5 nerve root encroachment are present.   L5-S1: Mild bulge.  Moderate facet arthropathy.  No compression.   There appears to be little change compared to prior MR.   IMPRESSION: Central and leftward protrusions at L1-2 and L2-3 with severe disc space narrowing and facet overgrowth; left L2 and left L3 encroachment observed; there is also asymmetric neural foraminal narrowing at these levels.   Solid fusion L3-4  Provider: Bonna Gains   Thoracic DG 2-3 views:  Results for orders placed during the hospital encounter of 03/11/16  DG Thoracic Spine 2 View   Narrative CLINICAL DATA:  Chronic upper and lower back pain.  Known scoliosis.  EXAM: THORACIC SPINE 2  VIEWS  COMPARISON:  Portable chest x-ray of December 15, 2015 and thoracic spine series of January 27, 2011  FINDINGS: There is prominent dextroscoliosis of the thoracolumbar spine centered at T12-L1. The bones are subjectively osteopenic. There is multilevel degenerative disc disease of the thoracic spine. No compression fracture is observed. There are no abnormal paravertebral soft tissue densities. The patient has undergone anterior fusion of the mid and lower cervical spine and intradiscal device placement at L3-4.  IMPRESSION: Moderate levoscoliosis centered at the thoracolumbar junction. Mild multilevel degenerative disc disease. No acute bony abnormality.  Aortic atherosclerosis.   Electronically Signed   By: David  Martinique M.D.   On: 03/11/2016 15:36    Lumbosacral Imaging: Lumbar MR wo contrast:  Results for orders placed during the hospital encounter of 03/11/16  MR Lumbar Spine Wo Contrast   Narrative CLINICAL DATA:  Low back pain since 1983, occasional leg pain and numbness, now worsening. History of surgery 1990.  EXAM: MRI LUMBAR SPINE WITHOUT CONTRAST  TECHNIQUE: Multiplanar, multisequence MR imaging of the lumbar spine was performed. No intravenous contrast was administered.  COMPARISON:  Lumbar spine radiographs March 11, 2016 and MRI of lumbar spine December 21, 2008  FINDINGS: SEGMENTATION: For the purposes of this report, the last well-formed intervertebral disc will be described as L5-S1.  ALIGNMENT: L2-3 and L4-5 retrolisthesis without spondylolysis. Severe dextroscoliosis inferred on the axial sequences. Maintenance of the lumbar lordosis.  VERTEBRAE:Lumbar vertebral bodies are intact. L1-2 interbody arthrodesis. L3-4 disc prosthesis results in susceptibility artifact. Severe L1-2 and L2-3 disc height loss. Moderate to severe L4-5 disc height loss, mild at L5-S1. Decreased T2 signal within all disc compatible with desiccation. Multilevel mild or  moderate subacute on chronic discogenic endplate change changes. Scattered small hemangiomas and focal fat. Included thoracic spine demonstrates T10 through T12 interbody arthrodesis. Acute moderate discogenic endplate changes predominately on the LEFT at T12-L1. Scattered acute Schmorl's nodes. Mild acute discogenic endplate changes [id number].  CONUS MEDULLARIS: Conus medullaris terminates at L1-2 and demonstrates normal morphology and signal characteristics. Cauda equina is normal.  PARASPINAL AND SOFT TISSUES: Multiple sub cm low T1 and low T2 likely hemorrhagic cysts in the  LEFT kidney. 9 mm cyst LEFT kidney. Mild symmetric paraspinal muscle atrophy.  DISC LEVELS:  T11-12: Tiny central disc osteophyte complex without canal stenosis or neural foraminal narrowing.  T12-L1: Small broad-based disc bulge and endplate spurring. Mild facet arthropathy and ligamentum flavum redundancy. Mild thecal sac effacement without canal stenosis. No neural foraminal narrowing.  L1-2: Small broad-based disc bulge, endplate spurring. No canal stenosis. Mild LEFT neural foraminal narrowing.  L2-3: Retrolisthesis. Small broad-based disc bulge and endplate spurring. Mild facet arthropathy and ligamentum flavum redundancy without canal stenosis. Moderate RIGHT, mild LEFT neural foraminal narrowing.  L3-4: Interbody fusion. No canal stenosis or neural foraminal narrowing.  L4-5: Retrolisthesis. Small broad-based disc bulge and moderate RIGHT subarticular extra foraminal disc protrusion and may affect the exited RIGHT L4 nerve. Partial effacement RIGHT lateral recess may affect the traversing RIGHT L5 nerve. No canal stenosis. Severe RIGHT, mild LEFT neural foraminal narrowing.  L5-S1: Small broad-based disc bulge. Severe facet arthropathy with trace LEFT facet effusion which is likely reactive. No canal stenosis. Mild RIGHT neural foraminal narrowing.  IMPRESSION: No acute fracture. Advanced  degenerative change of the included thoracic and lumbar spine, and severe lumbar dextroscoliosis on this nonweightbearing examination.  Grade 1 L2-3 and L4-5 retrolisthesis. Status post L3-4 interbody fusion.  No canal stenosis. Multilevel neural foraminal narrowing: Severe on the RIGHT at L4-5 in part due to moderate disc protrusion.   Electronically Signed   By: Elon Alas M.D.   On: 03/11/2016 17:09    Lumbar MR w/wo contrast:  Results for orders placed in visit on 12/21/08  MR Lumbar Spine W Wo Contrast   Narrative * PRIOR REPORT IMPORTED FROM AN EXTERNAL SYSTEM *   PRIOR REPORT IMPORTED FROM THE [facility] WORKFLOW SYSTEM   REASON FOR EXAM:    low back pain  COMMENTS:   PROCEDURE:     MR  - MR LUMBAR SPINE WO/W  - Dec 21 2008  1:59PM   RESULT:     Sagittal and axial T1 pre- and postgadolinium images as well  as  T2 weighted images were obtained through the lumbar spine.   There is metallic susceptibility artifact at the level of the L3-L4 disc  compatible with placement of an intradiscal device in the past. The  patient  has apparently undergone prior laminectomy in the lumbar spine in the  past.  The lumbar vertebral bodies are preserved in height. In the body of L2  there  is a well-circumscribed 1.5 cm diameter structure most compatible with a  hemangioma. There is no evidence of bone marrow edema. The conus medullary  is appears to terminate posterior to the body of approximately L1. Axial  imaging was performed from the mid body of T12 to the mid body of S1.   At T12-L1 there is an asymmetric endplate bar toward the right. In the  midline the AP dimension the thecal sac remains 15 mm and the neural  foramina are patent.    At L1-L2 there is annular disc bulging with endplate bar formation. The  AP  dimension the thecal sac is 14 mm and the neural foramina are patent.    At L2-L3 there is annular disc bulging. The AP dimension of the thecal  sac   measures approximately 15 mm and the neural foramina are patent. Mild  encroachment on the left is present here.   At L3-L4 the AP dimension of the thecal sac measures nearly 2 cm and the  neural foramina are patent.  At L4-L5 there is mild annular disc bulging. The AP dimension of the  thecal  sac measures roughly 13 mm there is mild encroachment upon the left neural  foramen.    At L5-S1 the AP dimension of the CSF filled thecal sac is approximately  14  mm and the neural foramina are patent. Following gadolinium enhancement I  do  not see significant abnormal enhancement to suggest scarring.   IMPRESSION:      There is multilevel degenerative disc disease without  high-grade spinal stenosis as described above. There are variable degrees  of  mild neural foraminal encroachment seen at several levels. There is no  discrete focal HNP. I do not see abnormal enhancement to suggest scarring.       Lumbar CT w contrast:  Results for orders placed during the hospital encounter of 11/09/07  CT Lumbar Spine W Contrast   Narrative Clinical Data:  Neck pain, mid back pain, low back pain   MYELOGRAM CERVICAL AND THORACIC AND LUMBAR   CT MYELOGRAM CERVICAL AND THORACIC AND LUMBAR   Technique: Injection was performed by the neurosurgeon at L4.  10 ml of Omnipaque-300 was instilled in the subarachnoid space 322 gauge spinal needle.Following injection of intrathecal Omnipaque contrast, spine imaging in multiple projections was performed using fluoroscopy.   Comparison: Lumbar MRI 07/21/2007   Findings: There is a previous Ray cage fusion at L3-4.  There is mild stenosis at L4-5 with effacement of both L5 nerve roots, right worse than left.  There is moderate scoliosis with bony overgrowth noted, particularly in the lower thoracic region where there is been previous fusion.  Because of the scoliosis, and the lumbar degenerative change, it was difficult to maneuver the contrast  into the thoracic and cervical regions for myelography.  The patient is transferred CT scan for CT myelogram evaluation   Fluoroscopy Time: 3.04 minutes   IMPRESSION: As above   CT MYELOGRAPHY CERVICAL SPINE   Technique:  CT imaging of the cervical spine was performed after intrathecal contrast administration. Multiplanar CT image reconstructions were also generated.   Comparison: No prior MRI or CT scans available of the neck   Findings:  Previous cervical fusion with instrumentation.  There appears to be continuous fusion from C4-T1.  Alignment is anatomic except for mild anterolisthesis of C2 forward on C3 of approximate 2 mm.   C2-3: Mild anterolisthesis.  Mild annular bulging.  No stenosis or nerve root encroachment.   C3-4: Advanced disk space narrowing  with central osteophyte formation and vacuum disc phenomenon.  No frank disc protrusion, and no foraminal narrowing.   C4-5: Solid fusion, no residual stenosis.   C5-6: Solid fusion, no residual stenosis.   C6-7: Solid fusion.  Mild residual uncinate spurring and neural foraminal narrowing on the left with associated mild facet arthropathy.  Equivocal left  C7 nerve root encroachment is present.   C7-T1: solid fusion.  Hardware intact.  No residual stenosis or nerve root encroachment   IMPRESSION: Satisfactory post fusion appearance, with mild degenerative change at C3-4, C2-3.   No significant residual compression, hardware failure, or adjacent segment disease.   CT MYELOGRAPHY THORACIC SPINE   Technique: CT imaging of the thoracic spine was performed after intrathecal contrast administration.  Multiplanar CT image reconstructions were also generated.   Comparison: MRI 11/27/2005   Findings:  Mild scoliosis is present convex left in the upper thoracic region of approximate 20 degrees.  Previous fusion at T10- 11 and T11-12, apparently spontaneous.  Endplate sclerotic change is present, but no  destructive lesions are seen and there is no subluxation at any level.   The individual disc spaces are examined as follows:    T1-2: Normal.   T2-3:  Shallow central and leftward protrusion.  Possible right T2 nerve root encroachment   T3-4:  Normal.   T4-5:  Shallow right protrusion, mildly flattening the cord in conjunction with scoliosis.  Right T4 nerve root encroachment may be present as well   T5-6:  Normal.   T6-7:  Calcified central protrusion, mildly flattening the cord and subarachnoid space.  No signet spinal stenosis however.   T7-8:  Shallow leftward protrusion, minimally flattening the cord. No compression of the exiting nerve root.   T8-9:  Tiny leftward protrusion, non compressive   T9-10:  Moderate sized soft disc protrusion centrally with disc space narrowing and vacuum disc phenomenon.  This does not compress the cord resolved spinal stenosis.   T10-11:  Tiny calcified protrusion, non compressive.   T11-12:  Solid fusion.  No disc pathology   Compared with prior MR there is little change.   IMPRESSION: Multiple thoracic disc protrusions as described, none of which appear result in significant cord compression or represent a significant interval change from 2007.   CT MYELOGRAPHY LUMBAR SPINE   Technique: CT imaging of the lumbar spine was performed after intrathecal contrast administration.  Multiplanar CT image reconstructions were also generated.   Comparison: Lumbar MRI 07/21/2007   Findings:  No prevertebral or paraspinous mass.  Minimal atherosclerotic calcification.   L1-2: Advanced disc space narrowing and shallow central protrusion. Asymmetric lateral recess encroachment left due to  the scoliotic deformity.  Left L2 nerve root encroachment suspected.  No stenosis of the canal. Left foraminal narrowing compresses the L1 nerve root   L2-3: Central and leftward protrusion associated with mild scoliosis.  Left L3 nerve root  encroachment present. Left L2 nerve root compression is seen in the foramen.   L3-4: Satisfactory Ray cage fusion appears solid.  No residual compression.  Wide hemilaminectomy defect, left and right.   L4-5: Shallow central protrusion with posterior element hypertrophy.  Asymmetric foraminal narrowing on the right due to uncinate spurring.  Mild right L4 and right L5 nerve root encroachment are present.   L5-S1: Mild bulge.  Moderate facet arthropathy.  No compression.   There appears to be little change compared to prior MR.   IMPRESSION: Central and leftward protrusions at L1-2 and L2-3 with severe disc space narrowing and facet overgrowth; left L2 and left L3 encroachment observed; there is also asymmetric neural foraminal narrowing at these levels.   Solid fusion L3-4  Provider: Bonna Gains   Lumbar DG 2-3 views:  Results for orders placed in visit on 01/26/11  DG Lumbar Spine 2-3 Views   Narrative * PRIOR REPORT IMPORTED FROM AN EXTERNAL SYSTEM *   PRIOR REPORT IMPORTED FROM THE [facility] [place] EXAM:    pain  COMMENTS:   May transport without cardiac monitor   PROCEDURE:     DXR - DXR LUMBAR SPINE AP AND LATERAL  - Jan 27 2011   3:09AM   RESULT:     Comparison: MRI of the lumbar spine 12/21/2008   Findings:  There is an S-shaped curvature of the thoracolumbar spine. There are 5  lumbar type vertebral bodies. Intervertebral disc space are seen at L3-L4.  Mild anterior height loss of L1, L2, and L3 is similar to prior MRI.  There  is multilevel degenerative disc disease throughout the lumbar spine.   Hardware is seen from left hip arthroplasty. 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   IMPRESSION:  Scoliosis. No definite acute changes. If there is continued clinical  concern, consider MRI for further evaluation.       Lumbar DG (Complete) 4+V:  Results for orders placed in visit on 09/13/01  DG Lumbar Spine Complete    Narrative FINDINGS CLINICAL DATA:  BACK PAIN. LUMBAR SPINE FIVE VIEWS PLAIN FILMS 09/14/01 FIVE LUMBAR TYPE VERTEBRAL BODIES ARE PRESENT.  THERE ARE THREADED INTERBODY CAGES AT L3-4.  THERE IS ADVANCED DEGENERATIVE DISC DISEASE AT L2-3 WITH DISC SPACE NARROWING AND OSTEOPHYTE FORMATION. THERE IS 4 MM RETROLISTHESIS OF L2 ON L3.  THIS INCREASED TO 8 MM IN EXTENSION AND FLEXION.  NO OTHER ABNORMALITY MOVEMENT IS SEEN.  THERE IS SOME DISC SPACE NARROWING AT L4-5 AS WELL.  VASCULAR CALCIFICATION IS PRESENT.  THERE IS MILD SCOLIOSIS CONVEX LEFT IN THE LUMBAR REGION. IMPRESSION 1.  DEGENERATIVE DISC DISEASE AS DESCRIBED. 2.  RETROLISTHESIS OF 4 MM AT L2-3, INCREASES TO 8 MM WITH FLEXION OR EXTENSION. THORACIC SPINE TWO VIEWS ADVANCED DEGENERATIVE DISC DISEASE IS SEEN AT T10-11 AND T11-12 WITH DISC SPACE NARROWING AND OSTEOPHYTE FORMATION.  THERE IS A VERY MILD SCOLIOSIS CONVEX LEFT IN THE UPPER THORACIC REGION COMPENSATORY TO THE RIGHT AT THE THORACOLUMBAR JUNCTION.  THERE ARE NO PARASPINOUS MASSES SEEN. IMPRESSION 1.  LOWER THORACIC DEGENERATIVE DISC DISEASE AS DESCRIBED.   Lumbar DG Bending views:  Results for orders placed during the hospital encounter of 03/11/16  DG Lumbar Spine Complete W/Bend   Narrative CLINICAL DATA:  Low back pain. History of previous surgery. Numbness in the legs.  EXAM: LUMBAR SPINE - COMPLETE WITH BENDING VIEWS  COMPARISON:  Lumbar spine series of January 27, 2011  FINDINGS: There is a chronic levo curvature centered at the thoracolumbar junction. The lumbar vertebral bodies are reasonably well maintained in height but there has been further mild wedging of the bodies of L2 and L3 since 20/12. There is degenerative disc space narrowing at all lumbar levels with evidence of previous inter discal device placement at L3-4. There is no spondylolisthesis. As the patient moves from the flexed to the extended position no definite instability is observed. The  observed portions of the sacrum are normal.  IMPRESSION: Multilevel degenerative disc disease throughout the lumbar spine. Prominent dextroscoliosis centered at the thoracolumbar junction. Increased anterior wedging of L2 and L3 since 2012.   Electronically Signed   By: David  Martinique M.D.   On: 03/11/2016 15:40    Sacroiliac Joint Imaging: Sacroiliac Joint DG:  Results for orders placed during the hospital encounter of 03/11/16  DG Si Joints   Narrative CLINICAL DATA:  Chronic low back pain, history of previous surgery.  EXAM: BILATERAL SACROILIAC JOINTS - 3+ VIEW  COMPARISON:  Lumbar spine series dated January 27, 2011 and lumbar spine series of today's date.  FINDINGS: The bones are subjectively osteopenic. The SI joint spaces are preserved. No bony ankylosis is demonstrated. No erosive changes are observed.  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 There are degenerative changes of the lower lumbar spine with evidence of inter discal device placement at L3-4.  IMPRESSION: No acute or significant chronic bony abnormality of the SI joints.   Electronically Signed   By: David  Martinique M.D.   On: 03/11/2016 15:37    Note: Results of ordered imaging test(s) reviewed  and explained to patient in Layman's terms. Copy of results provided to patient  Meds   Current Meds  Medication Sig  . alendronate (FOSAMAX) 70 MG tablet 70 mg every 30 (thirty) days.   Marland Kitchen amoxicillin (AMOXIL) 500 MG capsule Take 2,000 mg by mouth as needed. FOR DENTAL APPOINTMENTS  . aspirin 81 MG tablet Take 81 mg by mouth daily.  . calcium carbonate 1250 MG capsule Take 1,250 mg by mouth 2 (two) times daily with a meal.  . clopidogrel (PLAVIX) 75 MG tablet Take 75 mg by mouth daily.  . cyclobenzaprine (FLEXERIL) 10 MG tablet Take 10 mg by mouth 2 (two) times daily.   . diphenhydrAMINE (BENADRYL) 25 MG tablet Take 25 mg by mouth every 6 (six) hours as needed.  . levETIRAcetam (KEPPRA) 1000 MG tablet Take  500 mg by mouth 2 (two) times daily.   Marland Kitchen levothyroxine (SYNTHROID, LEVOTHROID) 100 MCG tablet Take 88 mcg by mouth daily before breakfast.   . metoprolol tartrate (LOPRESSOR) 25 MG tablet Take 25 mg by mouth 2 (two) times daily.  . Multiple Vitamins-Minerals (ICAPS AREDS 2 PO) Take 1 capsule by mouth 2 (two) times daily.  Marland Kitchen PARoxetine (PAXIL) 20 MG tablet Take 20 mg by mouth daily.  Marland Kitchen PROAIR HFA 108 (90 Base) MCG/ACT inhaler 2 puffs every 4 (four) hours as needed.   . Vitamin D, Ergocalciferol, (DRISDOL) 50000 units CAPS capsule 50,000 Units every 7 (seven) days.     ROS  Constitutional: Denies any fever or chills Gastrointestinal: No reported hemesis, hematochezia, vomiting, or acute GI distress Musculoskeletal: Denies any acute onset joint swelling, redness, loss of ROM, or weakness Neurological: No reported episodes of acute onset apraxia, aphasia, dysarthria, agnosia, amnesia, paralysis, loss of coordination, or loss of consciousness  Allergies  Ms. Waldrop is allergic to keflex [cephalexin]; lamotrigine; macrodantin [nitrofurantoin macrocrystal]; nitrofurantoin; and statins.  PFSH  Drug: Ms. Formisano  reports that she does not use drugs. Alcohol:  reports that she does not drink alcohol. Tobacco:  reports that she quit smoking about a year ago. She smoked 0.50 packs per day. She has never used smokeless tobacco. Medical:  has a past medical history of Arthritis; Cardiomegaly; Cataract; COPD (chronic obstructive pulmonary disease) ([place]); Coronary artery disease; Depression; Diverticulitis; Epilepsy ([place]); Fibromyalgia; GERD (gastroesophageal reflux disease); HOH (hard of hearing); Hypertension; Hypothyroidism; Inflammatory bowel disease; Lupus; MRSA (methicillin resistant staph aureus) culture positive (1990's); Opioid dependence ([place]); Pain; Seizures ([place]); Spinal disorder; and Thyroid disease. Surgical: Ms. Pierro  has a past surgical history that includes Cervical spine surgery; rotator cuff  surg (Right); Abdominal hysterectomy; cardiac stents (2013); Back surgery; Coronary angioplasty; Joint replacement (Left, 2007); Breast surgery; Cataract extraction w/PHACO (Left, 03/30/2016); Cataract extraction w/PHACO (Right, 04/27/2016); and Eye surgery. Family: family history includes Cancer in her father; Heart disease in her mother.  Constitutional Exam  General appearance: Well nourished, well developed, and well hydrated. In no apparent acute distress Vitals:   01/25/17 1350  BP: (!) 170/71  Pulse: 80  Resp: 16  Temp: 97.9 F (36.6 C)  SpO2: 99%  Weight: 128 lb (58.1 kg)   BMI Assessment: Estimated body mass index is 23.41 kg/m as calculated from the following:   Height as of 05/17/16: '5\' 2"'$  (1.575 m).   Weight as of this encounter: 128 lb (58.1 kg).  BMI interpretation table: BMI level Category Range association with higher incidence of chronic pain  <18 kg/m2 Underweight   18.5-24.9 kg/m2 Ideal body weight   25-29.9 kg/m2 Overweight  Increased incidence by 20%  30-34.9 kg/m2 Obese (Class I) Increased incidence by 68%  35-39.9 kg/m2 Severe obesity (Class II) Increased incidence by 136%  >40 kg/m2 Extreme obesity (Class III) Increased incidence by 254%   BMI Readings from Last 4 Encounters:  01/25/17 23.41 kg/m  05/17/16 21.03 kg/m  04/27/16 20.12 kg/m  03/30/16 18.84 kg/m   Wt Readings from Last 4 Encounters:  01/25/17 128 lb (58.1 kg)  05/17/16 115 lb (52.2 kg)  04/27/16 110 lb (49.9 kg)  03/30/16 103 lb (46.7 kg)  Psych/Mental status: Alert, oriented x 3 (person, place, & time)       Eyes: PERLA Respiratory: No evidence of acute respiratory distress  Cervical Spine Exam  Inspection: No masses, redness, or swelling Alignment: Symmetrical Functional ROM: Unrestricted ROM      Stability: No instability detected Muscle strength & Tone: Functionally intact Sensory: Unimpaired Palpation: No palpable anomalies              Upper Extremity (UE) Exam    Side:  Right upper extremity  Side: Left upper extremity  Inspection: No masses, redness, swelling, or asymmetry. No contractures  Inspection: No masses, redness, swelling, or asymmetry. No contractures  Functional ROM: Unrestricted ROM          Functional ROM: Unrestricted ROM          Muscle strength & Tone: Functionally intact  Muscle strength & Tone: Functionally intact  Sensory: Unimpaired  Sensory: Unimpaired  Palpation: No palpable anomalies              Palpation: No palpable anomalies              Specialized Test(s): Deferred         Specialized Test(s): Deferred          Thoracic Spine Exam  Inspection: prominent thoracic Kyphosis Alignment: Symmetrical Functional ROM: Unrestricted ROM Stability: No instability detected Sensory: Unimpaired Muscle strength & Tone: No palpable anomalies  Lumbar Spine Exam  Inspection: No masses, redness, or swelling Alignment: Symmetrical Functional ROM: Minimal ROM      Stability: No instability detected Muscle strength & Tone: Functionally intact Sensory: Movement-associated pain Palpation: Complains of area being tender to palpation       Provocative Tests: Lumbar Hyperextension and rotation test: Positive bilaterally for facet joint pain. Patrick's Maneuver: evaluation deferred today                    Gait & Posture Assessment  Ambulation: Patient ambulates using a cane Gait: Relatively normal for age and body habitus Posture: WNL   Lower Extremity Exam    Side: Right lower extremity  Side: Left lower extremity  Inspection: No masses, redness, swelling, or asymmetry. No contractures  Inspection: No masses, redness, swelling, or asymmetry. No contractures  Functional ROM: Unrestricted ROM          Functional ROM: Unrestricted ROM          Muscle strength & Tone: Functionally intact  Muscle strength & Tone: Functionally intact  Sensory: Unimpaired  Sensory: Unimpaired  Palpation: No palpable anomalies  Palpation: No palpable anomalies    Assessment  Primary Diagnosis & Pertinent Problem List: The primary encounter diagnosis was Chronic low back pain (Location of Primary Source of Pain) (Bilateral) (R>L). Diagnoses of Chronic neck pain (Location of Secondary source of pain) (Bilateral) (R>L), Chronic upper back pain (Location of Tertiary source of pain) (Bilateral) (R>L), Chronic pain syndrome, Lumbar spondylosis, Spondylosis of cervical region without  myelopathy or radiculopathy, Chronic Arm numbness (Bilateral) (R>L), Chronic thoracic spine pain, Numbness of lower extremity (B), and Long term current use of anticoagulant therapy were also pertinent to this visit.  Status Diagnosis  Persistent Persistent Persistent 1. Chronic low back pain (Location of Primary Source of Pain) (Bilateral) (R>L)   2. Chronic neck pain (Location of Secondary source of pain) (Bilateral) (R>L)   3. Chronic upper back pain (Location of Tertiary source of pain) (Bilateral) (R>L)   4. Chronic pain syndrome   5. Lumbar spondylosis   6. Spondylosis of cervical region without myelopathy or radiculopathy   7. Chronic Arm numbness (Bilateral) (R>L)   8. Chronic thoracic spine pain   9. Numbness of lower extremity (B)   10. Long term current use of anticoagulant therapy     Problems updated and reviewed during this visit: Problem  Chronic Pain Syndrome  Chronic Thoracic Spine Pain  Numbness of lower extremity (B)  Shoulder Pain  Chronic Arm numbness (Bilateral) (R>L)   Bilateral finger numbness in all of them. Ulnar distribution is worse on left.    Lumbar spondylosis  Carpal tunnel syndrome (S/P Right side release) (Bilateral) (B) (R>L)  Long Term Current Use of Anticoagulant Therapy  History of Helicobacter Pylori Infection   Plan of Care  Pharmacotherapy (Medications Ordered): No orders of the defined types were placed in this encounter.  New Prescriptions   No medications on file   Medications administered today: Ms. Moffitt had no  medications administered during this visit.  Lab-work, procedure(s), and/or referral(s): Orders Placed This Encounter  Procedures  . LUMBAR FACET(MEDIAL BRANCH NERVE BLOCK) MBNB  . DG Lumbar Spine Complete W/Bend  . CT THORACIC SPINE WO CONTRAST  . DG Cervical Spine Complete  . Blood Thinner Instructions to Nursing  . Blood Thinner Instructions to Nursing    Interventional therapies: Planned, scheduled, and/or pending:    Stop Plavix 7-10 days  Diagnostic bilateral lumbar facet block under fluoroscopic guidance and IV sedation    Considering:   Diagnostic caudal epidural steroid injection + diagnostic epidurogram  Possible Racz procedure.  Diagnostic bilateral lumbar facet blocks. Because of the patient's extensive hardware, she is not likely to be a good candidate for radiofrequency.    PRN Procedures:   None at this time.    Provider-requested follow-up: Return for procedure (w/ sedation), (ASAA), by MD, (Blood-thinner Protocol).  Future Appointments Date Time Provider Department Center  02/08/2017 11:45 AM Delano Metz, MD [facility] None   Primary Care Physician: Sherrie Mustache, MD Location: [facility] Note by: Oswaldo Done, MD Date: 01/25/2017; Time: 7:49 PM  Patient Instructions   ____________________________________________________________________________________________  Preparing for Procedure with Sedation Instructions: . Oral Intake: Do not eat or drink anything for at least 8 hours prior to your procedure. . Transportation: Public transportation is not allowed. Bring an adult driver. The driver must be physically present in our waiting room before any procedure can be started. Marland Kitchen Physical Assistance: Bring an adult physically capable of assisting you, in the event you need help. This adult should keep you company at home for at least 6 hours after the procedure. . Blood Pressure Medicine: Take your blood pressure  medicine with a sip of water the morning of the procedure. . Blood thinners:  . Diabetics on insulin: Notify the staff so that you can be scheduled 1st case in the morning. If your diabetes requires high dose insulin, take only  of your normal insulin dose the morning  of the procedure and notify the staff that you have done so. . Preventing infections: Shower with an antibacterial soap the morning of your procedure. . Build-up your immune system: Take 1000 mg of Vitamin C with every meal (3 times a day) the day prior to your procedure. Marland Kitchen Antibiotics: Inform the staff if you have a condition or reason that requires you to take antibiotics before dental procedures. . Pregnancy: If you are pregnant, call and cancel the procedure. . Sickness: If you have a cold, fever, or any active infections, call and cancel the procedure. . Arrival: You must be in the facility at least 30 minutes prior to your scheduled procedure. . Children: Do not bring children with you. . Dress appropriately: Bring dark clothing that you would not mind if they get stained. . Valuables: Do not bring any jewelry or valuables. Procedure appointments are reserved for interventional treatments only. Marland Kitchen No Prescription Refills. . No medication changes will be discussed during procedure appointments. . No disability issues will be discussed. ____________________________________________________________________________________________  ____________________________________________________________________________________________  Pain Scale  Introduction: The pain score used by this practice is the Verbal Numerical Rating Scale (VNRS-11). This is an 11-point scale. It is for adults and children 10 years or older. There are significant differences in how the pain score is reported, used, and applied. Forget everything you learned in the past and learn this scoring system.  General Information: The scale should reflect your current  level of pain. Unless you are specifically asked for the level of your worst pain, or your average pain. If you are asked for one of these two, then it should be understood that it is over the past 24 hours.  Basic Activities of Daily Living (ADL): Personal hygiene, dressing, eating, transferring, and using restroom.  Instructions: Most patients tend to report their level of pain as a combination of two factors, their physical pain and their psychosocial pain. This last one is also known as "suffering" and it is reflection of how physical pain affects you socially and psychologically. From now on, report them separately. From this point on, when asked to report your pain level, report only your physical pain. Use the following table for reference.  Pain Clinic Pain Levels (0-5/10)  Pain Level Score  Description  No Pain 0   Mild pain 1 Nagging, annoying, but does not interfere with basic activities of daily living (ADL). Patients are able to eat, bathe, get dressed, toileting (being able to get on and off the toilet and perform personal hygiene functions), transfer (move in and out of bed or a chair without assistance), and maintain continence (able to control bladder and bowel functions). Blood pressure and heart rate are unaffected. A normal heart rate for a healthy adult ranges from 60 to 100 bpm (beats per minute).   Mild to moderate pain 2 Noticeable and distracting. Impossible to hide from other people. More frequent flare-ups. Still possible to adapt and function close to normal. It can be very annoying and may have occasional stronger flare-ups. With discipline, patients may get used to it and adapt.   Moderate pain 3 Interferes significantly with activities of daily living (ADL). It becomes difficult to feed, bathe, get dressed, get on and off the toilet or to perform personal hygiene functions. Difficult to get in and out of bed or a chair without assistance. Very distracting. With effort, it  can be ignored when deeply involved in activities.   Moderately severe pain 4 Impossible to ignore for  more than a few minutes. With effort, patients may still be able to manage work or participate in some social activities. Very difficult to concentrate. Signs of autonomic nervous system discharge are evident: dilated pupils (mydriasis); mild sweating (diaphoresis); sleep interference. Heart rate becomes elevated (>115 bpm). Diastolic blood pressure (lower number) rises above 100 mmHg. Patients find relief in laying down and not moving.   Severe pain 5 Intense and extremely unpleasant. Associated with frowning face and frequent crying. Pain overwhelms the senses.  Ability to do any activity or maintain social relationships becomes significantly limited. Conversation becomes difficult. Pacing back and forth is common, as getting into a comfortable position is nearly impossible. Pain wakes you up from deep sleep. Physical signs will be obvious: pupillary dilation; increased sweating; goosebumps; brisk reflexes; cold, clammy hands and feet; nausea, vomiting or dry heaves; loss of appetite; significant sleep disturbance with inability to fall asleep or to remain asleep. When persistent, significant weight loss is observed due to the complete loss of appetite and sleep deprivation.  Blood pressure and heart rate becomes significantly elevated. Caution: If elevated blood pressure triggers a pounding headache associated with blurred vision, then the patient should immediately seek attention at an urgent or emergency care unit, as these may be signs of an impending stroke.    Emergency Department Pain Levels (6-10/10)  Emergency Room Pain 6 Severely limiting. Requires emergency care and should not be seen or managed at an outpatient pain management facility. Communication becomes difficult and requires great effort. Assistance to reach the emergency department may be required. Facial flushing and profuse sweating  along with potentially dangerous increases in heart rate and blood pressure will be evident.   Distressing pain 7 Self-care is very difficult. Assistance is required to transport, or use restroom. Assistance to reach the emergency department will be required. Tasks requiring coordination, such as bathing and getting dressed become very difficult.   Disabling pain 8 Self-care is no longer possible. At this level, pain is disabling. The individual is unable to do even the most "basic" activities such as walking, eating, bathing, dressing, transferring to a bed, or toileting. Fine motor skills are lost. It is difficult to think clearly.   Incapacitating pain 9 Pain becomes incapacitating. Thought processing is no longer possible. Difficult to remember your own name. Control of movement and coordination are lost.   The worst pain imaginable 10 At this level, most patients pass out from pain. When this level is reached, collapse of the autonomic nervous system occurs, leading to a sudden drop in blood pressure and heart rate. This in turn results in a temporary and dramatic drop in blood flow to the brain, leading to a loss of consciousness. Fainting is one of the body's self defense mechanisms. Passing out puts the brain in a calmed state and causes it to shut down for a while, in order to begin the healing process.    Summary: 1. Refer to this scale when providing [place] with your pain level. 2. Be accurate and careful when reporting your pain level. This will help with your care. 3. Over-reporting your pain level will lead to loss of credibility. 4. Even a level of 1/10 means that there is pain and will be treated at our facility. 5. High, inaccurate reporting will be documented as "Symptom Exaggeration", leading to loss of credibility and suspicions of possible secondary gains such as obtaining more narcotics, or wanting to appear disabled, for fraudulent reasons. 6. Only pain levels of 5  or below will  be seen at our facility. 7. Pain levels of 6 and above will be sent to the Emergency Department and the appointment cancelled. ____________________________________________________________________________________________  GENERAL RISKS AND COMPLICATIONS  What are the risk, side effects and possible complications? Generally speaking, most procedures are safe.  However, with any procedure there are risks, side effects, and the possibility of complications.  The risks and complications are dependent upon the sites that are lesioned, or the type of nerve block to be performed.  The closer the procedure is to the spine, the more serious the risks are.  Great care is taken when placing the radio frequency needles, block needles or lesioning probes, but sometimes complications can occur. 1. Infection: Any time there is an injection through the skin, there is a risk of infection.  This is why sterile conditions are used for these blocks.  There are four possible types of infection. 1. Localized skin infection. 2. Central Nervous System Infection-This can be in the form of Meningitis, which can be deadly. 3. Epidural Infections-This can be in the form of an epidural abscess, which can cause pressure inside of the spine, causing compression of the spinal cord with subsequent paralysis. This would require an emergency surgery to decompress, and there are no guarantees that the patient would recover from the paralysis. 4. Discitis-This is an infection of the intervertebral discs.  It occurs in about 1% of discography procedures.  It is difficult to treat and it may lead to surgery.        2. Pain: the needles have to go through skin and soft tissues, will cause soreness.       3. Damage to internal structures:  The nerves to be lesioned may be near blood vessels or    other nerves which can be potentially damaged.       4. Bleeding: Bleeding is more common if the patient is taking blood thinners such as   aspirin, Coumadin, Ticiid, Plavix, etc., or if he/she have some genetic predisposition  such as hemophilia. Bleeding into the spinal canal can cause compression of the spinal  cord with subsequent paralysis.  This would require an emergency surgery to  decompress and there are no guarantees that the patient would recover from the  paralysis.       5. Pneumothorax:  Puncturing of a lung is a possibility, every time a needle is introduced in  the area of the chest or upper back.  Pneumothorax refers to free air around the  collapsed lung(s), inside of the thoracic cavity (chest cavity).  Another two possible  complications related to a similar event would include: Hemothorax and Chylothorax.   These are variations of the Pneumothorax, where instead of air around the collapsed  lung(s), you may have blood or chyle, respectively.       6. Spinal headaches: They may occur with any procedures in the area of the spine.       7. Persistent CSF (Cerebro-Spinal Fluid) leakage: This is a rare problem, but may occur  with prolonged intrathecal or epidural catheters either due to the formation of a fistulous  track or a dural tear.       8. Nerve damage: By working so close to the spinal cord, there is always a possibility of  nerve damage, which could be as serious as a permanent spinal cord injury with  paralysis.       9. Death:  Although rare, severe deadly allergic reactions known  as "Anaphylactic  reaction" can occur to any of the medications used.      10. Worsening of the symptoms:  We can always make thing worse.  What are the chances of something like this happening? Chances of any of this occuring are extremely low.  By statistics, you have more of a chance of getting killed in a motor vehicle accident: while driving to the hospital than any of the above occurring .  Nevertheless, you should be aware that they are possibilities.  In general, it is similar to taking a shower.  Everybody knows that you can  slip, hit your head and get killed.  Does that mean that you should not shower again?  Nevertheless always keep in mind that statistics do not mean anything if you happen to be on the wrong side of them.  Even if a procedure has a 1 (one) in a 1,000,000 (million) chance of going wrong, it you happen to be that one..Also, keep in mind that by statistics, you have more of a chance of having something go wrong when taking medications.  Who should not have this procedure? If you are on a blood thinning medication (e.g. Coumadin, Plavix, see list of "Blood Thinners"), or if you have an active infection going on, you should not have the procedure.  If you are taking any blood thinners, please inform your physician.  How should I prepare for this procedure?  Do not eat or drink anything at least six hours prior to the procedure.  Bring a driver with you .  It cannot be a taxi.  Come accompanied by an adult that can drive you back, and that is strong enough to help you if your legs get weak or numb from the local anesthetic.  Take all of your medicines the morning of the procedure with just enough water to swallow them.  If you have diabetes, make sure that you are scheduled to have your procedure done first thing in the morning, whenever possible.  If you have diabetes, take only half of your insulin dose and notify our nurse that you have done so as soon as you arrive at the clinic.  If you are diabetic, but only take blood sugar pills (oral hypoglycemic), then do not take them on the morning of your procedure.  You may take them after you have had the procedure.  Do not take aspirin or any aspirin-containing medications, at least eleven (11) days prior to the procedure.  They may prolong bleeding.  Wear loose fitting clothing that may be easy to take off and that you would not mind if it got stained with Betadine or blood.  Do not wear any jewelry or perfume  Remove any nail coloring.  It will  interfere with some of our monitoring equipment.  NOTE: Remember that this is not meant to be interpreted as a complete list of all possible complications.  Unforeseen problems may occur.  BLOOD THINNERS The following drugs contain aspirin or other products, which can cause increased bleeding during surgery and should not be taken for 2 weeks prior to and 1 week after surgery.  If you should need take something for relief of minor pain, you may take acetaminophen which is found in Tylenol,m Datril, Anacin-3 and Panadol. It is not blood thinner. The products listed below are.  Do not take any of the products listed below in addition to any listed on your instruction sheet.  A.P.C or A.P.C with Codeine Codeine  Phosphate Capsules #3 Ibuprofen Ridaura  ABC compound Congesprin Imuran rimadil  Advil Cope Indocin Robaxisal  Alka-Seltzer Effervescent Pain Reliever and Antacid Coricidin or Coricidin-D  Indomethacin Rufen  Alka-Seltzer plus Cold Medicine Cosprin Ketoprofen S-A-C Tablets  Anacin Analgesic Tablets or Capsules Coumadin Korlgesic Salflex  Anacin Extra Strength Analgesic tablets or capsules CP-2 Tablets Lanoril Salicylate  Anaprox Cuprimine Capsules Levenox Salocol  Anexsia-D Dalteparin Magan Salsalate  Anodynos Darvon compound Magnesium Salicylate Sine-off  Ansaid Dasin Capsules Magsal Sodium Salicylate  Anturane Depen Capsules Marnal Soma  APF Arthritis pain formula Dewitt's Pills Measurin Stanback  Argesic Dia-Gesic Meclofenamic Sulfinpyrazone  Arthritis Bayer Timed Release Aspirin Diclofenac Meclomen Sulindac  Arthritis pain formula Anacin Dicumarol Medipren Supac  Analgesic (Safety coated) Arthralgen Diffunasal Mefanamic Suprofen  Arthritis Strength Bufferin Dihydrocodeine Mepro Compound Suprol  Arthropan liquid Dopirydamole Methcarbomol with Aspirin Synalgos  ASA tablets/Enseals Disalcid Micrainin Tagament  Ascriptin Doan's Midol Talwin  Ascriptin A/D Dolene Mobidin Tanderil   Ascriptin Extra Strength Dolobid Moblgesic Ticlid  Ascriptin with Codeine Doloprin or Doloprin with Codeine Momentum Tolectin  Asperbuf Duoprin Mono-gesic Trendar  Aspergum Duradyne Motrin or Motrin IB Triminicin  Aspirin plain, buffered or enteric coated Durasal Myochrisine Trigesic  Aspirin Suppositories Easprin Nalfon Trillsate  Aspirin with Codeine Ecotrin Regular or Extra Strength Naprosyn Uracel  Atromid-S Efficin Naproxen Ursinus  Auranofin Capsules Elmiron Neocylate Vanquish  Axotal Emagrin Norgesic Verin  Azathioprine Empirin or Empirin with Codeine Normiflo Vitamin E  Azolid Emprazil Nuprin Voltaren  Bayer Aspirin plain, buffered or children's or timed BC Tablets or powders Encaprin Orgaran Warfarin Sodium  Buff-a-Comp Enoxaparin Orudis Zorpin  Buff-a-Comp with Codeine Equegesic Os-Cal-Gesic   Buffaprin Excedrin plain, buffered or Extra Strength Oxalid   Bufferin Arthritis Strength Feldene Oxphenbutazone   Bufferin plain or Extra Strength Feldene Capsules Oxycodone with Aspirin   Bufferin with Codeine Fenoprofen Fenoprofen Pabalate or Pabalate-SF   Buffets II Flogesic Panagesic   Buffinol plain or Extra Strength Florinal or Florinal with Codeine Panwarfarin   Buf-Tabs Flurbiprofen Penicillamine   Butalbital Compound Four-way cold tablets Penicillin   Butazolidin Fragmin Pepto-Bismol   Carbenicillin Geminisyn Percodan   Carna Arthritis Reliever Geopen Persantine   Carprofen Gold's salt Persistin   Chloramphenicol Goody's Phenylbutazone   Chloromycetin Haltrain Piroxlcam   Clmetidine heparin Plaquenil   Cllnoril Hyco-pap Ponstel   Clofibrate Hydroxy chloroquine Propoxyphen         Before stopping any of these medications, be sure to consult the physician who ordered them.  Some, such as Coumadin (Warfarin) are ordered to prevent or treat serious conditions such as "deep thrombosis", "pumonary embolisms", and other heart problems.  The amount of time that you may need off  of the medication may also vary with the medication and the reason for which you were taking it.  If you are taking any of these medications, please make sure you notify your pain physician before you undergo any procedures.          Facet Joint Block The facet joints connect the bones of the spine (vertebrae). They make it possible for you to bend, twist, and make other movements with your spine. They also keep you from bending too far, twisting too far, and making other excessive movements. A facet joint block is a procedure where a numbing medicine (anesthetic) is injected into a facet joint. Often, a type of anti-inflammatory medicine called a steroid is also injected. A facet joint block may be done to diagnose neck or back pain. If the pain gets better  after a facet joint block, it means the pain is probably coming from the facet joint. If the pain does not get better, it means the pain is probably not coming from the facet joint. A facet joint block may also be done to relieve neck or back pain caused by an inflamed facet joint. A facet joint block is only done to relieve pain if the pain does not improve with other methods, such as medicine, exercise programs, and physical therapy. Tell a health care provider about:  Any allergies you have.  All medicines you are taking, including vitamins, herbs, eye drops, creams, and over-the-counter medicines.  Any problems you or family members have had with anesthetic medicines.  Any blood disorders you have.  Any surgeries you have had.  Any medical conditions you have.  Whether you are pregnant or may be pregnant. What are the risks? Generally, this is a safe procedure. However, problems may occur, including:  Bleeding.  Injury to a nerve near the injection site.  Pain at the injection site.  Weakness or numbness in areas controlled by nerves near the injection site.  Infection.  Temporary fluid retention.  Allergic  reactions to medicines or dyes.  Injury to other structures or organs near the injection site.  What happens before the procedure?  Follow instructions from your health care provider about eating or drinking restrictions.  Ask your health care provider about: ? Changing or stopping your regular medicines. This is especially important if you are taking diabetes medicines or blood thinners. ? Taking medicines such as aspirin and ibuprofen. These medicines can thin your blood. Do not take these medicines before your procedure if your health care provider instructs you not to.  Do not take any new dietary supplements or medicines without asking your health care provider first.  Plan to have someone take you home after the procedure. What happens during the procedure?  You may need to remove your clothing and dress in an open-back gown.  The procedure will be done while you are lying on an X-ray table. You will most likely be asked to lie on your stomach, but you may be asked to lie in a different position if an injection will be made in your neck.  Machines will be used to monitor your oxygen levels, heart rate, and blood pressure.  If an injection will be made in your neck, an IV tube will be inserted into one of your veins. Fluids and medicine will flow directly into your body through the IV tube.  The area over the facet joint where the injection will be made will be cleaned with soap. The surrounding skin will be covered with clean drapes.  A numbing medicine (local anesthetic) will be applied to your skin. Your skin may sting or burn for a moment.  A video X-ray machine (fluoroscopy) will be used to locate the joint. In some cases, a CT scan may be used.  A contrast dye may be injected into the facet joint area to help locate the joint.  When the joint is located, an anesthetic will be injected into the joint through the needle.  Your health care provider will ask you whether you  feel pain relief. If you do feel relief, a steroid may be injected to provide pain relief for a longer period of time. If you do not feel relief or feel only partial relief, additional injections of an anesthetic may be made in other facet joints.  The needle will  be removed.  Your skin will be cleaned.  A bandage (dressing) will be applied over each injection site. The procedure may vary among health care providers and hospitals. What happens after the procedure?  You will be observed for 15-30 minutes before being allowed to go home. This information is not intended to replace advice given to you by your health care provider. Make sure you discuss any questions you have with your health care provider. Document Released: 11/17/2006 Document Revised: 07/30/2015 Document Reviewed: 03/24/2015 [employer] Interactive Patient Education  [employer].

## 2017-01-25 ENCOUNTER — Ambulatory Visit
Admission: RE | Admit: 2017-01-25 | Discharge: 2017-01-25 | Disposition: A | Discharge status: Home / Self Care | Payer: Medicare Other | Source: Ambulatory Visit | Attending: Pain Medicine | Admitting: Pain Medicine

## 2017-01-25 ENCOUNTER — Ambulatory Visit: Payer: Medicare Other | Attending: Pain Medicine | Admitting: Pain Medicine

## 2017-01-25 ENCOUNTER — Encounter: Payer: Self-pay | Admitting: Pain Medicine

## 2017-01-25 VITALS — BP 170/71 | HR 80 | Temp 97.9°F | Resp 16 | Wt 128.0 lb

## 2017-01-25 DIAGNOSIS — M47812 Spondylosis without myelopathy or radiculopathy, cervical region: Secondary | ICD-10-CM

## 2017-01-25 DIAGNOSIS — Z981 Arthrodesis status: Secondary | ICD-10-CM | POA: Diagnosis not present

## 2017-01-25 DIAGNOSIS — M542 Cervicalgia: Secondary | ICD-10-CM

## 2017-01-25 DIAGNOSIS — R1310 Dysphagia, unspecified: Secondary | ICD-10-CM | POA: Insufficient documentation

## 2017-01-25 DIAGNOSIS — J449 Chronic obstructive pulmonary disease, unspecified: Secondary | ICD-10-CM | POA: Diagnosis not present

## 2017-01-25 DIAGNOSIS — M4185 Other forms of scoliosis, thoracolumbar region: Secondary | ICD-10-CM | POA: Insufficient documentation

## 2017-01-25 DIAGNOSIS — I10 Essential (primary) hypertension: Secondary | ICD-10-CM | POA: Diagnosis not present

## 2017-01-25 DIAGNOSIS — G894 Chronic pain syndrome: Secondary | ICD-10-CM | POA: Insufficient documentation

## 2017-01-25 DIAGNOSIS — Z7901 Long term (current) use of anticoagulants: Secondary | ICD-10-CM | POA: Insufficient documentation

## 2017-01-25 DIAGNOSIS — M47897 Other spondylosis, lumbosacral region: Secondary | ICD-10-CM | POA: Diagnosis not present

## 2017-01-25 DIAGNOSIS — M48061 Spinal stenosis, lumbar region without neurogenic claudication: Secondary | ICD-10-CM | POA: Insufficient documentation

## 2017-01-25 DIAGNOSIS — D126 Benign neoplasm of colon, unspecified: Secondary | ICD-10-CM | POA: Diagnosis not present

## 2017-01-25 DIAGNOSIS — E039 Hypothyroidism, unspecified: Secondary | ICD-10-CM | POA: Diagnosis not present

## 2017-01-25 DIAGNOSIS — R200 Anesthesia of skin: Secondary | ICD-10-CM | POA: Insufficient documentation

## 2017-01-25 DIAGNOSIS — Z888 Allergy status to other drugs, medicaments and biological substances status: Secondary | ICD-10-CM | POA: Diagnosis not present

## 2017-01-25 DIAGNOSIS — Z79899 Other long term (current) drug therapy: Secondary | ICD-10-CM | POA: Insufficient documentation

## 2017-01-25 DIAGNOSIS — G8929 Other chronic pain: Secondary | ICD-10-CM | POA: Insufficient documentation

## 2017-01-25 DIAGNOSIS — G5603 Carpal tunnel syndrome, bilateral upper limbs: Secondary | ICD-10-CM | POA: Insufficient documentation

## 2017-01-25 DIAGNOSIS — M545 Low back pain: Secondary | ICD-10-CM | POA: Insufficient documentation

## 2017-01-25 DIAGNOSIS — R4182 Altered mental status, unspecified: Secondary | ICD-10-CM | POA: Insufficient documentation

## 2017-01-25 DIAGNOSIS — M4805 Spinal stenosis, thoracolumbar region: Secondary | ICD-10-CM | POA: Diagnosis not present

## 2017-01-25 DIAGNOSIS — M47892 Other spondylosis, cervical region: Secondary | ICD-10-CM | POA: Insufficient documentation

## 2017-01-25 DIAGNOSIS — Z9889 Other specified postprocedural states: Secondary | ICD-10-CM | POA: Diagnosis not present

## 2017-01-25 DIAGNOSIS — M5031 Other cervical disc degeneration,  high cervical region: Secondary | ICD-10-CM | POA: Diagnosis not present

## 2017-01-25 DIAGNOSIS — M47896 Other spondylosis, lumbar region: Secondary | ICD-10-CM | POA: Insufficient documentation

## 2017-01-25 DIAGNOSIS — M47816 Spondylosis without myelopathy or radiculopathy, lumbar region: Secondary | ICD-10-CM

## 2017-01-25 DIAGNOSIS — M4802 Spinal stenosis, cervical region: Secondary | ICD-10-CM | POA: Insufficient documentation

## 2017-01-25 DIAGNOSIS — M546 Pain in thoracic spine: Secondary | ICD-10-CM

## 2017-01-25 DIAGNOSIS — M2578 Osteophyte, vertebrae: Secondary | ICD-10-CM | POA: Diagnosis not present

## 2017-01-25 DIAGNOSIS — I2510 Atherosclerotic heart disease of native coronary artery without angina pectoris: Secondary | ICD-10-CM | POA: Diagnosis not present

## 2017-01-25 DIAGNOSIS — Z96642 Presence of left artificial hip joint: Secondary | ICD-10-CM | POA: Diagnosis not present

## 2017-01-25 DIAGNOSIS — M549 Dorsalgia, unspecified: Secondary | ICD-10-CM

## 2017-01-25 DIAGNOSIS — Z955 Presence of coronary angioplasty implant and graft: Secondary | ICD-10-CM | POA: Insufficient documentation

## 2017-01-25 NOTE — Patient Instructions (Addendum)
____________________________________________________________________________________________  Preparing for Procedure with Sedation Instructions: . Oral Intake: Do not eat or drink anything for at least 8 hours prior to your procedure. . Transportation: Public transportation is not allowed. Bring an adult driver. The driver must be physically present in our waiting room before any procedure can be started. Marland Kitchen Physical Assistance: Bring an adult physically capable of assisting you, in the event you need help. This adult should keep you company at home for at least 6 hours after the procedure. . Blood Pressure Medicine: Take your blood pressure medicine with a sip of water the morning of the procedure. . Blood thinners:  . Diabetics on insulin: Notify the staff so that you can be scheduled 1st case in the morning. If your diabetes requires high dose insulin, take only  of your normal insulin dose the morning of the procedure and notify the staff that you have done so. . Preventing infections: Shower with an antibacterial soap the morning of your procedure. . Build-up your immune system: Take 1000 mg of Vitamin C with every meal (3 times a day) the day prior to your procedure. Marland Kitchen Antibiotics: Inform the staff if you have a condition or reason that requires you to take antibiotics before dental procedures. . Pregnancy: If you are pregnant, call and cancel the procedure. . Sickness: If you have a cold, fever, or any active infections, call and cancel the procedure. . Arrival: You must be in the facility at least 30 minutes prior to your scheduled procedure. . Children: Do not bring children with you. . Dress appropriately: Bring dark clothing that you would not mind if they get stained. . Valuables: Do not bring any jewelry or valuables. Procedure appointments are reserved for interventional treatments only. Marland Kitchen No Prescription Refills. . No medication changes will be discussed during procedure  appointments. . No disability issues will be discussed. ____________________________________________________________________________________________  ____________________________________________________________________________________________  Pain Scale  Introduction: The pain score used by this practice is the Verbal Numerical Rating Scale (VNRS-11). This is an 11-point scale. It is for adults and children 10 years or older. There are significant differences in how the pain score is reported, used, and applied. Forget everything you learned in the past and learn this scoring system.  General Information: The scale should reflect your current level of pain. Unless you are specifically asked for the level of your worst pain, or your average pain. If you are asked for one of these two, then it should be understood that it is over the past 24 hours.  Basic Activities of Daily Living (ADL): Personal hygiene, dressing, eating, transferring, and using restroom.  Instructions: Most patients tend to report their level of pain as a combination of two factors, their physical pain and their psychosocial pain. This last one is also known as "suffering" and it is reflection of how physical pain affects you socially and psychologically. From now on, report them separately. From this point on, when asked to report your pain level, report only your physical pain. Use the following table for reference.  Pain Clinic Pain Levels (0-5/10)  Pain Level Score  Description  No Pain 0   Mild pain 1 Nagging, annoying, but does not interfere with basic activities of daily living (ADL). Patients are able to eat, bathe, get dressed, toileting (being able to get on and off the toilet and perform personal hygiene functions), transfer (move in and out of bed or a chair without assistance), and maintain continence (able to control bladder and bowel  functions). Blood pressure and heart rate are unaffected. A normal heart rate for  a healthy adult ranges from 60 to 100 bpm (beats per minute).   Mild to moderate pain 2 Noticeable and distracting. Impossible to hide from other people. More frequent flare-ups. Still possible to adapt and function close to normal. It can be very annoying and may have occasional stronger flare-ups. With discipline, patients may get used to it and adapt.   Moderate pain 3 Interferes significantly with activities of daily living (ADL). It becomes difficult to feed, bathe, get dressed, get on and off the toilet or to perform personal hygiene functions. Difficult to get in and out of bed or a chair without assistance. Very distracting. With effort, it can be ignored when deeply involved in activities.   Moderately severe pain 4 Impossible to ignore for more than a few minutes. With effort, patients may still be able to manage work or participate in some social activities. Very difficult to concentrate. Signs of autonomic nervous system discharge are evident: dilated pupils (mydriasis); mild sweating (diaphoresis); sleep interference. Heart rate becomes elevated (>115 bpm). Diastolic blood pressure (lower number) rises above 100 mmHg. Patients find relief in laying down and not moving.   Severe pain 5 Intense and extremely unpleasant. Associated with frowning face and frequent crying. Pain overwhelms the senses.  Ability to do any activity or maintain social relationships becomes significantly limited. Conversation becomes difficult. Pacing back and forth is common, as getting into a comfortable position is nearly impossible. Pain wakes you up from deep sleep. Physical signs will be obvious: pupillary dilation; increased sweating; goosebumps; brisk reflexes; cold, clammy hands and feet; nausea, vomiting or dry heaves; loss of appetite; significant sleep disturbance with inability to fall asleep or to remain asleep. When persistent, significant weight loss is observed due to the complete loss of appetite and  sleep deprivation.  Blood pressure and heart rate becomes significantly elevated. Caution: If elevated blood pressure triggers a pounding headache associated with blurred vision, then the patient should immediately seek attention at an urgent or emergency care unit, as these may be signs of an impending stroke.    Emergency Department Pain Levels (6-10/10)  Emergency Room Pain 6 Severely limiting. Requires emergency care and should not be seen or managed at an outpatient pain management facility. Communication becomes difficult and requires great effort. Assistance to reach the emergency department may be required. Facial flushing and profuse sweating along with potentially dangerous increases in heart rate and blood pressure will be evident.   Distressing pain 7 Self-care is very difficult. Assistance is required to transport, or use restroom. Assistance to reach the emergency department will be required. Tasks requiring coordination, such as bathing and getting dressed become very difficult.   Disabling pain 8 Self-care is no longer possible. At this level, pain is disabling. The individual is unable to do even the most "basic" activities such as walking, eating, bathing, dressing, transferring to a bed, or toileting. Fine motor skills are lost. It is difficult to think clearly.   Incapacitating pain 9 Pain becomes incapacitating. Thought processing is no longer possible. Difficult to remember your own name. Control of movement and coordination are lost.   The worst pain imaginable 10 At this level, most patients pass out from pain. When this level is reached, collapse of the autonomic nervous system occurs, leading to a sudden drop in blood pressure and heart rate. This in turn results in a temporary and dramatic drop in blood flow  to the brain, leading to a loss of consciousness. Fainting is one of the body's self defense mechanisms. Passing out puts the brain in a calmed state and causes it to shut  down for a while, in order to begin the healing process.    Summary: 1. Refer to this scale when providing [place] with your pain level. 2. Be accurate and careful when reporting your pain level. This will help with your care. 3. Over-reporting your pain level will lead to loss of credibility. 4. Even a level of 1/10 means that there is pain and will be treated at our facility. 5. High, inaccurate reporting will be documented as "Symptom Exaggeration", leading to loss of credibility and suspicions of possible secondary gains such as obtaining more narcotics, or wanting to appear disabled, for fraudulent reasons. 6. Only pain levels of 5 or below will be seen at our facility. 7. Pain levels of 6 and above will be sent to the Emergency Department and the appointment cancelled. ____________________________________________________________________________________________  GENERAL RISKS AND COMPLICATIONS  What are the risk, side effects and possible complications? Generally speaking, most procedures are safe.  However, with any procedure there are risks, side effects, and the possibility of complications.  The risks and complications are dependent upon the sites that are lesioned, or the type of nerve block to be performed.  The closer the procedure is to the spine, the more serious the risks are.  Great care is taken when placing the radio frequency needles, block needles or lesioning probes, but sometimes complications can occur. 1. Infection: Any time there is an injection through the skin, there is a risk of infection.  This is why sterile conditions are used for these blocks.  There are four possible types of infection. 1. Localized skin infection. 2. Central Nervous System Infection-This can be in the form of Meningitis, which can be deadly. 3. Epidural Infections-This can be in the form of an epidural abscess, which can cause pressure inside of the spine, causing compression of the spinal cord with  subsequent paralysis. This would require an emergency surgery to decompress, and there are no guarantees that the patient would recover from the paralysis. 4. Discitis-This is an infection of the intervertebral discs.  It occurs in about 1% of discography procedures.  It is difficult to treat and it may lead to surgery.        2. Pain: the needles have to go through skin and soft tissues, will cause soreness.       3. Damage to internal structures:  The nerves to be lesioned may be near blood vessels or    other nerves which can be potentially damaged.       4. Bleeding: Bleeding is more common if the patient is taking blood thinners such as  aspirin, Coumadin, Ticiid, Plavix, etc., or if he/she have some genetic predisposition  such as hemophilia. Bleeding into the spinal canal can cause compression of the spinal  cord with subsequent paralysis.  This would require an emergency surgery to  decompress and there are no guarantees that the patient would recover from the  paralysis.       5. Pneumothorax:  Puncturing of a lung is a possibility, every time a needle is introduced in  the area of the chest or upper back.  Pneumothorax refers to free air around the  collapsed lung(s), inside of the thoracic cavity (chest cavity).  Another two possible  complications related to a similar event would include: Hemothorax  and Chylothorax.   These are variations of the Pneumothorax, where instead of air around the collapsed  lung(s), you may have blood or chyle, respectively.       6. Spinal headaches: They may occur with any procedures in the area of the spine.       7. Persistent CSF (Cerebro-Spinal Fluid) leakage: This is a rare problem, but may occur  with prolonged intrathecal or epidural catheters either due to the formation of a fistulous  track or a dural tear.       8. Nerve damage: By working so close to the spinal cord, there is always a possibility of  nerve damage, which could be as serious as a permanent  spinal cord injury with  paralysis.       9. Death:  Although rare, severe deadly allergic reactions known as "Anaphylactic  reaction" can occur to any of the medications used.      10. Worsening of the symptoms:  We can always make thing worse.  What are the chances of something like this happening? Chances of any of this occuring are extremely low.  By statistics, you have more of a chance of getting killed in a motor vehicle accident: while driving to the hospital than any of the above occurring .  Nevertheless, you should be aware that they are possibilities.  In general, it is similar to taking a shower.  Everybody knows that you can slip, hit your head and get killed.  Does that mean that you should not shower again?  Nevertheless always keep in mind that statistics do not mean anything if you happen to be on the wrong side of them.  Even if a procedure has a 1 (one) in a 1,000,000 (million) chance of going wrong, it you happen to be that one..Also, keep in mind that by statistics, you have more of a chance of having something go wrong when taking medications.  Who should not have this procedure? If you are on a blood thinning medication (e.g. Coumadin, Plavix, see list of "Blood Thinners"), or if you have an active infection going on, you should not have the procedure.  If you are taking any blood thinners, please inform your physician.  How should I prepare for this procedure?  Do not eat or drink anything at least six hours prior to the procedure.  Bring a driver with you .  It cannot be a taxi.  Come accompanied by an adult that can drive you back, and that is strong enough to help you if your legs get weak or numb from the local anesthetic.  Take all of your medicines the morning of the procedure with just enough water to swallow them.  If you have diabetes, make sure that you are scheduled to have your procedure done first thing in the morning, whenever possible.  If you have  diabetes, take only half of your insulin dose and notify our nurse that you have done so as soon as you arrive at the clinic.  If you are diabetic, but only take blood sugar pills (oral hypoglycemic), then do not take them on the morning of your procedure.  You may take them after you have had the procedure.  Do not take aspirin or any aspirin-containing medications, at least eleven (11) days prior to the procedure.  They may prolong bleeding.  Wear loose fitting clothing that may be easy to take off and that you would not mind if it got stained with  Betadine or blood.  Do not wear any jewelry or perfume  Remove any nail coloring.  It will interfere with some of our monitoring equipment.  NOTE: Remember that this is not meant to be interpreted as a complete list of all possible complications.  Unforeseen problems may occur.  BLOOD THINNERS The following drugs contain aspirin or other products, which can cause increased bleeding during surgery and should not be taken for 2 weeks prior to and 1 week after surgery.  If you should need take something for relief of minor pain, you may take acetaminophen which is found in Tylenol,m Datril, Anacin-3 and Panadol. It is not blood thinner. The products listed below are.  Do not take any of the products listed below in addition to any listed on your instruction sheet.  A.P.C or A.P.C with Codeine Codeine Phosphate Capsules #3 Ibuprofen Ridaura  ABC compound Congesprin Imuran rimadil  Advil Cope Indocin Robaxisal  Alka-Seltzer Effervescent Pain Reliever and Antacid Coricidin or Coricidin-D  Indomethacin Rufen  Alka-Seltzer plus Cold Medicine Cosprin Ketoprofen S-A-C Tablets  Anacin Analgesic Tablets or Capsules Coumadin Korlgesic Salflex  Anacin Extra Strength Analgesic tablets or capsules CP-2 Tablets Lanoril Salicylate  Anaprox Cuprimine Capsules Levenox Salocol  Anexsia-D Dalteparin Magan Salsalate  Anodynos Darvon compound Magnesium Salicylate  Sine-off  Ansaid Dasin Capsules Magsal Sodium Salicylate  Anturane Depen Capsules Marnal Soma  APF Arthritis pain formula Dewitt's Pills Measurin Stanback  Argesic Dia-Gesic Meclofenamic Sulfinpyrazone  Arthritis Bayer Timed Release Aspirin Diclofenac Meclomen Sulindac  Arthritis pain formula Anacin Dicumarol Medipren Supac  Analgesic (Safety coated) Arthralgen Diffunasal Mefanamic Suprofen  Arthritis Strength Bufferin Dihydrocodeine Mepro Compound Suprol  Arthropan liquid Dopirydamole Methcarbomol with Aspirin Synalgos  ASA tablets/Enseals Disalcid Micrainin Tagament  Ascriptin Doan's Midol Talwin  Ascriptin A/D Dolene Mobidin Tanderil  Ascriptin Extra Strength Dolobid Moblgesic Ticlid  Ascriptin with Codeine Doloprin or Doloprin with Codeine Momentum Tolectin  Asperbuf Duoprin Mono-gesic Trendar  Aspergum Duradyne Motrin or Motrin IB Triminicin  Aspirin plain, buffered or enteric coated Durasal Myochrisine Trigesic  Aspirin Suppositories Easprin Nalfon Trillsate  Aspirin with Codeine Ecotrin Regular or Extra Strength Naprosyn Uracel  Atromid-S Efficin Naproxen Ursinus  Auranofin Capsules Elmiron Neocylate Vanquish  Axotal Emagrin Norgesic Verin  Azathioprine Empirin or Empirin with Codeine Normiflo Vitamin E  Azolid Emprazil Nuprin Voltaren  Bayer Aspirin plain, buffered or children's or timed BC Tablets or powders Encaprin Orgaran Warfarin Sodium  Buff-a-Comp Enoxaparin Orudis Zorpin  Buff-a-Comp with Codeine Equegesic Os-Cal-Gesic   Buffaprin Excedrin plain, buffered or Extra Strength Oxalid   Bufferin Arthritis Strength Feldene Oxphenbutazone   Bufferin plain or Extra Strength Feldene Capsules Oxycodone with Aspirin   Bufferin with Codeine Fenoprofen Fenoprofen Pabalate or Pabalate-SF   Buffets II Flogesic Panagesic   Buffinol plain or Extra Strength Florinal or Florinal with Codeine Panwarfarin   Buf-Tabs Flurbiprofen Penicillamine   Butalbital Compound Four-way cold tablets  Penicillin   Butazolidin Fragmin Pepto-Bismol   Carbenicillin Geminisyn Percodan   Carna Arthritis Reliever Geopen Persantine   Carprofen Gold's salt Persistin   Chloramphenicol Goody's Phenylbutazone   Chloromycetin Haltrain Piroxlcam   Clmetidine heparin Plaquenil   Cllnoril Hyco-pap Ponstel   Clofibrate Hydroxy chloroquine Propoxyphen         Before stopping any of these medications, be sure to consult the physician who ordered them.  Some, such as Coumadin (Warfarin) are ordered to prevent or treat serious conditions such as "deep thrombosis", "pumonary embolisms", and other heart problems.  The amount of time that you may  need off of the medication may also vary with the medication and the reason for which you were taking it.  If you are taking any of these medications, please make sure you notify your pain physician before you undergo any procedures.          Facet Joint Block The facet joints connect the bones of the spine (vertebrae). They make it possible for you to bend, twist, and make other movements with your spine. They also keep you from bending too far, twisting too far, and making other excessive movements. A facet joint block is a procedure where a numbing medicine (anesthetic) is injected into a facet joint. Often, a type of anti-inflammatory medicine called a steroid is also injected. A facet joint block may be done to diagnose neck or back pain. If the pain gets better after a facet joint block, it means the pain is probably coming from the facet joint. If the pain does not get better, it means the pain is probably not coming from the facet joint. A facet joint block may also be done to relieve neck or back pain caused by an inflamed facet joint. A facet joint block is only done to relieve pain if the pain does not improve with other methods, such as medicine, exercise programs, and physical therapy. Tell a health care provider about:  Any allergies you have.  All  medicines you are taking, including vitamins, herbs, eye drops, creams, and over-the-counter medicines.  Any problems you or family members have had with anesthetic medicines.  Any blood disorders you have.  Any surgeries you have had.  Any medical conditions you have.  Whether you are pregnant or may be pregnant. What are the risks? Generally, this is a safe procedure. However, problems may occur, including:  Bleeding.  Injury to a nerve near the injection site.  Pain at the injection site.  Weakness or numbness in areas controlled by nerves near the injection site.  Infection.  Temporary fluid retention.  Allergic reactions to medicines or dyes.  Injury to other structures or organs near the injection site.  What happens before the procedure?  Follow instructions from your health care provider about eating or drinking restrictions.  Ask your health care provider about: ? Changing or stopping your regular medicines. This is especially important if you are taking diabetes medicines or blood thinners. ? Taking medicines such as aspirin and ibuprofen. These medicines can thin your blood. Do not take these medicines before your procedure if your health care provider instructs you not to.  Do not take any new dietary supplements or medicines without asking your health care provider first.  Plan to have someone take you home after the procedure. What happens during the procedure?  You may need to remove your clothing and dress in an open-back gown.  The procedure will be done while you are lying on an X-ray table. You will most likely be asked to lie on your stomach, but you may be asked to lie in a different position if an injection will be made in your neck.  Machines will be used to monitor your oxygen levels, heart rate, and blood pressure.  If an injection will be made in your neck, an IV tube will be inserted into one of your veins. Fluids and medicine will flow  directly into your body through the IV tube.  The area over the facet joint where the injection will be made will be cleaned with soap. The surrounding  skin will be covered with clean drapes.  A numbing medicine (local anesthetic) will be applied to your skin. Your skin may sting or burn for a moment.  A video X-ray machine (fluoroscopy) will be used to locate the joint. In some cases, a CT scan may be used.  A contrast dye may be injected into the facet joint area to help locate the joint.  When the joint is located, an anesthetic will be injected into the joint through the needle.  Your health care provider will ask you whether you feel pain relief. If you do feel relief, a steroid may be injected to provide pain relief for a longer period of time. If you do not feel relief or feel only partial relief, additional injections of an anesthetic may be made in other facet joints.  The needle will be removed.  Your skin will be cleaned.  A bandage (dressing) will be applied over each injection site. The procedure may vary among health care providers and hospitals. What happens after the procedure?  You will be observed for 15-30 minutes before being allowed to go home. This information is not intended to replace advice given to you by your health care provider. Make sure you discuss any questions you have with your health care provider. Document Released: 11/17/2006 Document Revised: 07/30/2015 Document Reviewed: 03/24/2015 Elsevier Interactive Patient Education  [employer].

## 2017-01-25 NOTE — Progress Notes (Signed)
Safety precautions to be maintained throughout the outpatient stay will include: orient to surroundings, keep bed in low position, maintain call bell within reach at all times, provide assistance with transfer out of bed and ambulation.  

## 2017-02-07 ENCOUNTER — Telehealth: Payer: Self-pay | Admitting: Pain Medicine

## 2017-02-07 NOTE — Telephone Encounter (Signed)
Attempted to call patient back.No answer and no way to leave VM.

## 2017-02-07 NOTE — Telephone Encounter (Signed)
Patient lvmail on Friday 02-04-17  She has CT scheduled for 8-1 and procedure for 7-31, should she wait on procedure until after the CT is completed. Please let patient know if she should rsched procedure.

## 2017-02-08 ENCOUNTER — Telehealth: Payer: Self-pay

## 2017-02-08 ENCOUNTER — Ambulatory Visit: Payer: Medicare Other | Admitting: Pain Medicine

## 2017-02-08 NOTE — Telephone Encounter (Signed)
Patient lvmail 02-07-17 at 3:46 stating she called Dr. Rosario Jacks and he did not receive fax about procedure. Also she did not get script for med she is supposed to take for procedure. She canceled procedure but wants to talk with someone about this.

## 2017-02-08 NOTE — Telephone Encounter (Signed)
Dr Rosario Jacks fax # [phone]

## 2017-02-08 NOTE — Telephone Encounter (Signed)
Pt says she has not heard from any one about stooping her plavix for the procedure pt would like to speak with a nurse

## 2017-02-08 NOTE — Telephone Encounter (Signed)
Voicemail left with Dr Guerry Bruin office to verify if they have received Medical clearance to stop Lovenox prior to procedure.  Also attempted to call patient back, no answer and no voicemail capability.

## 2017-02-08 NOTE — Telephone Encounter (Signed)
Spoke with Dr Guerry Bruin office re; clearance that was sent in November 2017.  Dr Rosario Jacks will review to make sure nothing has changed and then we can contact patient re; rescheduling and stopping plavix.

## 2017-02-09 ENCOUNTER — Ambulatory Visit: Payer: Medicare Other

## 2017-02-09 NOTE — Telephone Encounter (Signed)
Medical clearance sent to Dr Rosario Jacks.

## 2017-02-15 ENCOUNTER — Telehealth: Payer: Self-pay | Admitting: Pain Medicine

## 2017-02-15 ENCOUNTER — Ambulatory Visit
Admission: RE | Admit: 2017-02-15 | Discharge: 2017-02-15 | Disposition: A | Discharge status: Home / Self Care | Payer: Medicare Other | Source: Ambulatory Visit | Attending: Pain Medicine | Admitting: Pain Medicine

## 2017-02-15 DIAGNOSIS — I700 Atherosclerosis of aorta: Secondary | ICD-10-CM | POA: Insufficient documentation

## 2017-02-15 DIAGNOSIS — M4803 Spinal stenosis, cervicothoracic region: Secondary | ICD-10-CM | POA: Diagnosis not present

## 2017-02-15 DIAGNOSIS — R918 Other nonspecific abnormal finding of lung field: Secondary | ICD-10-CM | POA: Diagnosis not present

## 2017-02-15 DIAGNOSIS — G8929 Other chronic pain: Secondary | ICD-10-CM

## 2017-02-15 DIAGNOSIS — M546 Pain in thoracic spine: Secondary | ICD-10-CM | POA: Insufficient documentation

## 2017-02-15 DIAGNOSIS — J432 Centrilobular emphysema: Secondary | ICD-10-CM | POA: Insufficient documentation

## 2017-02-15 DIAGNOSIS — M47814 Spondylosis without myelopathy or radiculopathy, thoracic region: Secondary | ICD-10-CM | POA: Insufficient documentation

## 2017-02-15 DIAGNOSIS — Z981 Arthrodesis status: Secondary | ICD-10-CM | POA: Diagnosis not present

## 2017-02-15 DIAGNOSIS — M549 Dorsalgia, unspecified: Secondary | ICD-10-CM

## 2017-02-15 DIAGNOSIS — M40205 Unspecified kyphosis, thoracolumbar region: Secondary | ICD-10-CM | POA: Diagnosis not present

## 2017-02-15 NOTE — Telephone Encounter (Signed)
Dr. Rosario Jacks wants to speak with Dr. Dossie Arbour about Diane Adkins

## 2017-02-23 ENCOUNTER — Telehealth: Payer: Self-pay | Admitting: Pain Medicine

## 2017-02-23 NOTE — Telephone Encounter (Signed)
CONTINUATION OF PREVIOUS NOTE;  Patient stopped Plavix on her own x 5 days ago and the recommendation was to stop Plavix x 7 days prior to procedure.  I have called Dr Guerry Bruin office to see what there recommendatio n is as we can't see patient for facet block until 03/03/17 which would be a 13 day cessation of Plavix.  Have called patient to let her know and that I will let her know what their response is. Patient verbalizes u/o information.

## 2017-02-23 NOTE — Telephone Encounter (Signed)
Spoke with patient re; Dr Guerry Bruin instructions to stop Plavix prior to procedure and I do have clearance that was fa

## 2017-02-23 NOTE — Telephone Encounter (Signed)
Patient  Received ok to stop plavix, we have ok from pcp, how many days should she stop plavix, she has stopped it herself and been off for 5 days, dr Dossie Arbour has openings on 03-03-17 for procedures we can sched, please let patient know or let me know how many days to stop and I will schedule her appt.

## 2017-02-23 NOTE — Telephone Encounter (Signed)
Dr Guerry Bruin office faxed over a note that states for patient to start the plavix back and stop on the 18th prior to procedure on 03/03/17.  Juliann Pulse will call patient and schedule procedure on 03/03/17 and relay information about stopping Plavix.  Called to patient to educate on pre procedure directions.

## 2017-03-03 ENCOUNTER — Encounter: Payer: Self-pay | Admitting: Pain Medicine

## 2017-03-03 ENCOUNTER — Ambulatory Visit
Admission: RE | Admit: 2017-03-03 | Discharge: 2017-03-03 | Disposition: A | Discharge status: Home / Self Care | Payer: Medicare Other | Source: Ambulatory Visit | Attending: Pain Medicine | Admitting: Pain Medicine

## 2017-03-03 ENCOUNTER — Ambulatory Visit (HOSPITAL_BASED_OUTPATIENT_CLINIC_OR_DEPARTMENT_OTHER): Payer: Medicare Other | Admitting: Pain Medicine

## 2017-03-03 VITALS — BP 138/81 | HR 80 | Temp 98.7°F | Resp 20 | Ht 61.0 in | Wt 125.0 lb

## 2017-03-03 DIAGNOSIS — M545 Low back pain: Secondary | ICD-10-CM | POA: Diagnosis present

## 2017-03-03 DIAGNOSIS — Z7982 Long term (current) use of aspirin: Secondary | ICD-10-CM | POA: Insufficient documentation

## 2017-03-03 DIAGNOSIS — M4696 Unspecified inflammatory spondylopathy, lumbar region: Secondary | ICD-10-CM

## 2017-03-03 DIAGNOSIS — Z9842 Cataract extraction status, left eye: Secondary | ICD-10-CM | POA: Diagnosis not present

## 2017-03-03 DIAGNOSIS — Z961 Presence of intraocular lens: Secondary | ICD-10-CM | POA: Diagnosis not present

## 2017-03-03 DIAGNOSIS — G8929 Other chronic pain: Secondary | ICD-10-CM

## 2017-03-03 DIAGNOSIS — Z888 Allergy status to other drugs, medicaments and biological substances status: Secondary | ICD-10-CM | POA: Insufficient documentation

## 2017-03-03 DIAGNOSIS — Z79899 Other long term (current) drug therapy: Secondary | ICD-10-CM | POA: Insufficient documentation

## 2017-03-03 DIAGNOSIS — Z881 Allergy status to other antibiotic agents status: Secondary | ICD-10-CM | POA: Insufficient documentation

## 2017-03-03 DIAGNOSIS — Z955 Presence of coronary angioplasty implant and graft: Secondary | ICD-10-CM | POA: Diagnosis not present

## 2017-03-03 DIAGNOSIS — M5136 Other intervertebral disc degeneration, lumbar region: Secondary | ICD-10-CM | POA: Insufficient documentation

## 2017-03-03 DIAGNOSIS — M47816 Spondylosis without myelopathy or radiculopathy, lumbar region: Secondary | ICD-10-CM

## 2017-03-03 DIAGNOSIS — Z9841 Cataract extraction status, right eye: Secondary | ICD-10-CM | POA: Diagnosis not present

## 2017-03-03 DIAGNOSIS — M542 Cervicalgia: Secondary | ICD-10-CM | POA: Diagnosis not present

## 2017-03-03 DIAGNOSIS — Z7902 Long term (current) use of antithrombotics/antiplatelets: Secondary | ICD-10-CM | POA: Diagnosis not present

## 2017-03-03 DIAGNOSIS — M488X6 Other specified spondylopathies, lumbar region: Secondary | ICD-10-CM | POA: Insufficient documentation

## 2017-03-03 MED ORDER — TRIAMCINOLONE ACETONIDE 40 MG/ML IJ SUSP
40.0000 mg | Freq: Once | INTRAMUSCULAR | Status: AC
  Administered 2017-03-03: 40 mg
  Filled 2017-03-03: qty 1

## 2017-03-03 MED ORDER — FENTANYL CITRATE (PF) 100 MCG/2ML IJ SOLN
25.0000 ug | INTRAMUSCULAR | Status: DC | PRN
  Filled 2017-03-03: qty 2

## 2017-03-03 MED ORDER — ROPIVACAINE HCL 2 MG/ML IJ SOLN
9.0000 mL | Freq: Once | INTRAMUSCULAR | Status: AC
  Administered 2017-03-03: 10 mL via PERINEURAL
  Filled 2017-03-03: qty 10

## 2017-03-03 MED ORDER — MIDAZOLAM HCL 5 MG/5ML IJ SOLN
1.0000 mg | INTRAMUSCULAR | Status: DC | PRN
  Administered 2017-03-03: 1 mg via INTRAVENOUS
  Filled 2017-03-03: qty 5

## 2017-03-03 MED ORDER — LIDOCAINE HCL 2 % IJ SOLN
10.0000 mL | Freq: Once | INTRAMUSCULAR | Status: AC
  Administered 2017-03-03: 400 mg
  Filled 2017-03-03: qty 20

## 2017-03-03 MED ORDER — LACTATED RINGERS IV SOLN
1000.0000 mL | Freq: Once | INTRAVENOUS | Status: AC
  Administered 2017-03-03: 1000 mL via INTRAVENOUS

## 2017-03-03 NOTE — Progress Notes (Signed)
Safety precautions to be maintained throughout the outpatient stay will include: orient to surroundings, keep bed in low position, maintain call bell within reach at all times, provide assistance with transfer out of bed and ambulation.  

## 2017-03-03 NOTE — Patient Instructions (Addendum)
____________________________________________________________________________________________  Post-Procedure instructions Instructions:  Apply ice: Fill a plastic sandwich bag with crushed ice. Cover it with a small towel and apply to injection site. Apply for 15 minutes then remove x 15 minutes. Repeat sequence on day of procedure, until you go to bed. The purpose is to minimize swelling and discomfort after procedure.  Apply heat: Apply heat to procedure site starting the day following the procedure. The purpose is to treat any soreness and discomfort from the procedure.  Food intake: Start with clear liquids (like water) and advance to regular food, as tolerated.   Physical activities: Keep activities to a minimum for the first 8 hours after the procedure.   Driving: If you have received any sedation, you are not allowed to drive for 24 hours after your procedure.  Blood thinner: Restart your blood thinner 6 hours after your procedure. (Only for those taking blood thinners)  Insulin: As soon as you can eat, you may resume your normal dosing schedule. (Only for those taking insulin)  Infection prevention: Keep procedure site clean and dry.  Post-procedure Pain Diary: Extremely important that this be done correctly and accurately. Recorded information will be used to determine the next step in treatment.  Pain evaluated is that of treated area only. Do not include pain from an untreated area.  Complete every hour, on the hour, for the initial 8 hours. Set an alarm to help you do this part accurately.  Do not go to sleep and have it completed later. It will not be accurate.  Follow-up appointment: Keep your follow-up appointment after the procedure. Usually 2 weeks for most procedures. (6 weeks in the case of radiofrequency.) Bring you pain diary.  Expect:  From numbing medicine (AKA: Local Anesthetics): Numbness or decrease in pain.  Onset: Full effect within 15 minutes of  injected.  Duration: It will depend on the type of local anesthetic used. On the average, 1 to 8 hours.   From steroids: Decrease in swelling or inflammation. Once inflammation is improved, relief of the pain will follow.  Onset of benefits: Depends on the amount of swelling present. The more swelling, the longer it will take for the benefits to be seen. In some cases, up to 10 days.  Duration: Steroids will stay in the system x 2 weeks. Duration of benefits will depend on multiple posibilities including persistent irritating factors.  From procedure: Some discomfort is to be expected once the numbing medicine wears off. This should be minimal if ice and heat are applied as instructed. Call if:  You experience numbness and weakness that gets worse with time, as opposed to wearing off.  New onset bowel or bladder incontinence. (Spinal procedures only)  Emergency Numbers:  Durning business hours (Monday - Thursday, 8:00 AM - 4:00 PM) (Friday, 9:00 AM - 12:00 Noon): [phone]  After hours: [phone] ____________________________________________________________________________________________  Pain Management Discharge Instructions  General Discharge Instructions :  If you need to reach your doctor call: Monday-Friday 8:00 am - 4:00 pm at [phone] or toll free [phone].  After clinic hours [phone] to have operator reach doctor.  Bring all of your medication bottles to all your appointments in the pain clinic.  To cancel or reschedule your appointment with Pain Management please remember to call 24 hours in advance to avoid a fee.  Refer to the educational materials which you have been given on: General Risks, I had my Procedure. Discharge Instructions, Post Sedation.  Post Procedure Instructions:  The drugs you   were given will stay in your system until tomorrow, so for the next 24 hours you should not drive, make any legal decisions or drink any alcoholic  beverages.  You may eat anything you prefer, but it is better to start with liquids then soups and crackers, and gradually work up to solid foods.  Please notify your doctor immediately if you have any unusual bleeding, trouble breathing or pain that is not related to your normal pain.  Depending on the type of procedure that was done, some parts of your body may feel week and/or numb.  This usually clears up by tonight or the next day.  Walk with the use of an assistive device or accompanied by an adult for the 24 hours.  You may use ice on the affected area for the first 24 hours.  Put ice in a Ziploc bag and cover with a towel and place against area 15 minutes on 15 minutes off.  You may switch to heat after 24 hours. 

## 2017-03-03 NOTE — Progress Notes (Signed)
Patient's Name: Diane Adkins  MRN: [id number]  Referring Provider: Casilda Carls, MD  DOB: 1945/08/27  PCP: Casilda Carls, MD  DOS: 03/03/2017  Note by: Gaspar Cola, MD  Service setting: Ambulatory outpatient  Specialty: Interventional Pain Management  Patient type: Established  Location: [facility] (AMB) Pain Management Facility  Visit type: Interventional Procedure   Primary Reason for Visit: Interventional Pain Management Treatment. CC: Back Pain (lumbar and thoracic) and Neck Pain  Procedure:  Anesthesia, Analgesia, Anxiolysis:  Type: Diagnostic Medial Branch Facet Block Region: Lumbar Level: L2, L3, L4, L5, & S1 Medial Branch Level(s) Laterality: Bilateral  Type: Local Anesthesia with Moderate (Conscious) Sedation Local Anesthetic: Lidocaine 1% Route: Intravenous (IV) IV Access: Secured Sedation: Meaningful verbal contact was maintained at all times during the procedure  Indication(s): Analgesia and Anxiety  Indications: 1. Lumbar facet syndrome (Bilateral) (R>L)   2. Lumbar spondylosis   3. DDD (degenerative disc disease), lumbar   4. Chronic low back pain (Location of Primary Source of Pain) (Bilateral) (R>L)    Pain Score: Pre-procedure: 8 /10 Post-procedure: 0-No pain/10  Pre-op Assessment:  Diane Adkins is a 71 y.o. (year old), female patient, seen today for interventional treatment. She  has a past surgical history that includes Cervical spine surgery; rotator cuff surg (Right); Abdominal hysterectomy; cardiac stents (2013); Back surgery; Coronary angioplasty; Joint replacement (Left, 2007); Breast surgery; Cataract extraction w/PHACO (Left, 03/30/2016); Cataract extraction w/PHACO (Right, 04/27/2016); and Eye surgery. Diane Adkins has a current medication list which includes the following prescription(s): alendronate, aspirin, calcium carbonate, clopidogrel, cyclobenzaprine, diphenhydramine, levetiracetam, levothyroxine, loratadine, metoprolol tartrate, omeprazole, paroxetine,  proair hfa, and vitamin d (ergocalciferol), and the following Facility-Administered Medications: fentanyl and midazolam. Her primarily concern today is the Back Pain (lumbar and thoracic) and Neck Pain  Initial Vital Signs: Blood pressure 133/78, pulse 75, temperature 98 F (36.7 C), resp. rate 16, height 5\' 1"  (1.549 m), weight 125 lb (56.7 kg), SpO2 97 %. BMI: Estimated body mass index is 23.62 kg/m as calculated from the following:   Height as of this encounter: 5\' 1"  (1.549 m).   Weight as of this encounter: 125 lb (56.7 kg).  Risk Assessment: Allergies: Reviewed. She is allergic to keflex [cephalexin]; lamotrigine; macrodantin [nitrofurantoin macrocrystal]; nitrofurantoin; and statins.  Allergy Precautions: None required Coagulopathies: Reviewed. None identified.  Blood-thinner therapy: None at this time Active Infection(s): Reviewed. None identified. Diane Adkins is afebrile  Site Confirmation: Ms. Filter was asked to confirm the procedure and laterality before marking the site Procedure checklist: Completed Consent: Before the procedure and under the influence of no sedative(s), amnesic(s), or anxiolytics, the patient was informed of the treatment options, risks and possible complications. To fulfill our ethical and legal obligations, as recommended by the American Medical Association's Code of Ethics, I have informed the patient of my clinical impression; the nature and purpose of the treatment or procedure; the risks, benefits, and possible complications of the intervention; the alternatives, including doing nothing; the risk(s) and benefit(s) of the alternative treatment(s) or procedure(s); and the risk(s) and benefit(s) of doing nothing. The patient was provided information about the general risks and possible complications associated with the procedure. These may include, but are not limited to: failure to achieve desired goals, infection, bleeding, organ or nerve damage, allergic reactions,  paralysis, and death. In addition, the patient was informed of those risks and complications associated to Spine-related procedures, such as failure to decrease pain; infection (i.e.: Meningitis, epidural or intraspinal abscess); bleeding (i.e.: epidural hematoma, subarachnoid hemorrhage,  or any other type of intraspinal or peri-dural bleeding); organ or nerve damage (i.e.: Any type of peripheral nerve, nerve root, or spinal cord injury) with subsequent damage to sensory, motor, and/or autonomic systems, resulting in permanent pain, numbness, and/or weakness of one or several areas of the body; allergic reactions; (i.e.: anaphylactic reaction); and/or death. Furthermore, the patient was informed of those risks and complications associated with the medications. These include, but are not limited to: allergic reactions (i.e.: anaphylactic or anaphylactoid reaction(s)); adrenal axis suppression; blood sugar elevation that in diabetics may result in ketoacidosis or comma; water retention that in patients with history of congestive heart failure may result in shortness of breath, pulmonary edema, and decompensation with resultant heart failure; weight gain; swelling or edema; medication-induced neural toxicity; particulate matter embolism and blood vessel occlusion with resultant organ, and/or nervous system infarction; and/or aseptic necrosis of one or more joints. Finally, the patient was informed that Medicine is not an exact science; therefore, there is also the possibility of unforeseen or unpredictable risks and/or possible complications that may result in a catastrophic outcome. The patient indicated having understood very clearly. We have given the patient no guarantees and we have made no promises. Enough time was given to the patient to ask questions, all of which were answered to the patient's satisfaction. Diane Adkins has indicated that she wanted to continue with the procedure. Attestation: I, the ordering  provider, attest that I have discussed with the patient the benefits, risks, side-effects, alternatives, likelihood of achieving goals, and potential problems during recovery for the procedure that I have provided informed consent. Date: 03/03/2017; Time: 1:12 PM  Pre-Procedure Preparation:  Monitoring: As per clinic protocol. Respiration, ETCO2, SpO2, BP, heart rate and rhythm monitor placed and checked for adequate function Safety Precautions: Patient was assessed for positional comfort and pressure points before starting the procedure. Time-out: I initiated and conducted the "Time-out" before starting the procedure, as per protocol. The patient was asked to participate by confirming the accuracy of the "Time Out" information. Verification of the correct person, site, and procedure were performed and confirmed by me, the nursing staff, and the patient. "Time-out" conducted as per Joint Commission's Universal Protocol (UP.01.01.01). "Time-out" Date & Time: 03/03/2017; 1347 hrs.  Description of Procedure Process:   Position: Prone Target Area: For Lumbar Facet blocks, the target is the groove formed by the junction of the transverse process and superior articular process. For the L5 dorsal ramus, the target is the notch between superior articular process and sacral ala. For the S1 dorsal ramus, the target is the superior and lateral edge of the posterior S1 Sacral foramen. Approach: Paramedial approach. Area Prepped: Entire Posterior Lumbosacral Region Prepping solution: ChloraPrep (2% chlorhexidine gluconate and 70% isopropyl alcohol) Safety Precautions: Aspiration looking for blood return was conducted prior to all injections. At no point did we inject any substances, as a needle was being advanced. No attempts were made at seeking any paresthesias. Safe injection practices and needle disposal techniques used. Medications properly checked for expiration dates. SDV (single dose vial) medications  used. Description of the Procedure: Protocol guidelines were followed. The patient was placed in position over the fluoroscopy table. The target area was identified and the area prepped in the usual manner. Skin desensitized using vapocoolant spray. Skin & deeper tissues infiltrated with local anesthetic. Appropriate amount of time allowed to pass for local anesthetics to take effect. The procedure needle was introduced through the skin, ipsilateral to the reported pain, and advanced to  the target area. Employing the "Medial Branch Technique", the needles were advanced to the angle made by the superior and medial portion of the transverse process, and the lateral and inferior portion of the superior articulating process of the targeted vertebral bodies. This area is known as "Burton's Eye" or the "Eye of the [place] Dog". A procedure needle was introduced through the skin, and this time advanced to the angle made by the superior and medial border of the sacral ala, and the lateral border of the S1 vertebral body. This last needle was later repositioned at the superior and lateral border of the posterior S1 foramen. Negative aspiration confirmed. Solution injected in intermittent fashion, asking for systemic symptoms every 0.5cc of injectate. The needles were then removed and the area cleansed, making sure to leave some of the prepping solution back to take advantage of its long term bactericidal properties.   Illustration of the posterior view of the lumbar spine and the posterior neural structures. Laminae of L2 through S1 are labeled. DPRL5, dorsal primary ramus of L5; DPRS1, dorsal primary ramus of S1; DPR3, dorsal primary ramus of L3; FJ, facet (zygapophyseal) joint L3-L4; I, inferior articular process of L4; LB1, lateral branch of dorsal primary ramus of L1; IAB, inferior articular branches from L3 medial branch (supplies L4-L5 facet joint); IBP, intermediate branch plexus; MB3, medial branch of dorsal  primary ramus of L3; NR3, third lumbar nerve root; S, superior articular process of L5; SAB, superior articular branches from L4 (supplies L4-5 facet joint also); TP3, transverse process of L3.  Vitals:   03/03/17 1407 03/03/17 1417 03/03/17 1426 03/03/17 1436  BP: 133/82 (!) 153/73 135/80 138/81  Pulse: 80     Resp: 18 17 18 20   Temp:  98.7 F (37.1 C)    TempSrc:  Temporal    SpO2: 98% 97% 99% 97%  Weight:      Height:        Start Time: 1347 hrs. End Time: 1407 hrs. Materials:  Needle(s) Type: Regular needle Gauge: 22G Length: 3.5-in Medication(s): We administered lactated ringers, midazolam, lidocaine, triamcinolone acetonide, ropivacaine (PF) 2 mg/mL (0.2%), triamcinolone acetonide, and ropivacaine (PF) 2 mg/mL (0.2%). Please see chart orders for dosing details.  Imaging Guidance (Spinal):  Type of Imaging Technique: Fluoroscopy Guidance (Spinal) Indication(s): Assistance in needle guidance and placement for procedures requiring needle placement in or near specific anatomical locations not easily accessible without such assistance. Exposure Time: Please see nurses notes. Contrast: None used. Fluoroscopic Guidance: I was personally present during the use of fluoroscopy. "Tunnel Vision Technique" used to obtain the best possible view of the target area. Parallax error corrected before commencing the procedure. "Direction-depth-direction" technique used to introduce the needle under continuous pulsed fluoroscopy. Once target was reached, antero-posterior, oblique, and lateral fluoroscopic projection used confirm needle placement in all planes. Images permanently stored in EMR. Interpretation: No contrast injected. I personally interpreted the imaging intraoperatively. Adequate needle placement confirmed in multiple planes. Permanent images saved into the patient's record.  Antibiotic Prophylaxis:  Indication(s): None identified Antibiotic given: None  Post-operative Assessment:   EBL: None Complications: No immediate post-treatment complications observed by team, or reported by patient. Note: The patient tolerated the entire procedure well. A repeat set of vitals were taken after the procedure and the patient was kept under observation following institutional policy, for this type of procedure. Post-procedural neurological assessment was performed, showing return to baseline, prior to discharge. The patient was provided with post-procedure discharge instructions, including a section on  how to identify potential problems. Should any problems arise concerning this procedure, the patient was given instructions to immediately contact us, at any time, without hesitation. In any case, we plan to contact the patient by telephone for a follow-up status report regarding this interventional procedure. Comments:  No additional relevant information.  Plan of Care  Disposition: Discharge home  Discharge Date & Time: 03/03/2017; 1439 hrs.  Physician-requested Follow-up:  Return for post-procedure eval by Dr. Dossie Arbour in 2 weeks.  Future Appointments Date Time Provider [place]  04/04/2017 2:00 PM Milinda Pointer, MD [facility] None    Imaging Orders     DG C-Arm 1-60 Min-No Report  Procedure Orders     LUMBAR FACET(MEDIAL BRANCH NERVE BLOCK) MBNB  Medications ordered for procedure: Meds ordered this encounter  Medications  . lactated ringers infusion 1,000 mL  . midazolam (VERSED) 5 MG/5ML injection 1-2 mg    Make sure Flumazenil is available in the pyxis when using this medication. If oversedation occurs, administer 0.2 mg IV over 15 sec. If after 45 sec no response, administer 0.2 mg again over 1 min; may repeat at 1 min intervals; not to exceed 4 doses (1 mg)  . fentaNYL (SUBLIMAZE) injection 25-50 mcg    Make sure Narcan is available in the pyxis when using this medication. In the event of respiratory depression (RR< 8/min): Titrate NARCAN (naloxone) in  increments of 0.1 to 0.2 mg IV at 2-3 minute intervals, until desired degree of reversal.  . lidocaine (XYLOCAINE) 2 % (with pres) injection 200 mg  . triamcinolone acetonide (KENALOG-40) injection 40 mg  . ropivacaine (PF) 2 mg/mL (0.2%) (NAROPIN) injection 9 mL  . triamcinolone acetonide (KENALOG-40) injection 40 mg  . ropivacaine (PF) 2 mg/mL (0.2%) (NAROPIN) injection 9 mL   Medications administered: We administered lactated ringers, midazolam, lidocaine, triamcinolone acetonide, ropivacaine (PF) 2 mg/mL (0.2%), triamcinolone acetonide, and ropivacaine (PF) 2 mg/mL (0.2%).  See the medical record for exact dosing, route, and time of administration.  New Prescriptions   No medications on file   Primary Care Physician: Casilda Carls, MD Location: [facility] Note by: Gaspar Cola, MD Date: 03/03/2017; Time: 2:46 PM  Disclaimer:  Medicine is not an Chief Strategy Officer. The only guarantee in medicine is that nothing is guaranteed. It is important to note that the decision to proceed with this intervention was based on the information collected from the patient. The Data and conclusions were drawn from the patient's questionnaire, the interview, and the physical examination. Because the information was provided in large part by the patient, it cannot be guaranteed that it has not been purposely or unconsciously manipulated. Every effort has been made to obtain as much relevant data as possible for this evaluation. It is important to note that the conclusions that lead to this procedure are derived in large part from the available data. Always take into account that the treatment will also be dependent on availability of resources and existing treatment guidelines, considered by other Pain Management Practitioners as being common knowledge and practice, at the time of the intervention. For Medico-Legal purposes, it is also important to point out that variation in  procedural techniques and pharmacological choices are the acceptable norm. The indications, contraindications, technique, and results of the above procedure should only be interpreted and judged by a Board-Certified Interventional Pain Specialist with extensive familiarity and expertise in the same exact procedure and technique.

## 2017-03-04 ENCOUNTER — Telehealth: Payer: Self-pay

## 2017-03-04 NOTE — Telephone Encounter (Signed)
Post procedure phone call.  States she is doing well.

## 2017-03-22 NOTE — Progress Notes (Signed)
Results were reviewed and found to be: abnormal  No acute injury or pathology identified  Review would suggest interventional pain management techniques may be of benefit

## 2017-04-03 NOTE — Progress Notes (Signed)
Patient's Name: Diane Adkins  MRN: [id number]  Referring Provider: Casilda Carls, MD  DOB: 1945-10-15  PCP: Casilda Carls, MD  DOS: 04/04/2017  Note by: Gaspar Cola, MD  Service setting: Ambulatory outpatient  Specialty: Interventional Pain Management  Location: [facility] (AMB) Pain Management Facility    Patient type: Established   Primary Reason(s) for Visit: Encounter for post-procedure evaluation of chronic illness with mild to moderate exacerbation CC: Back Pain (lower right is worse)  HPI  Diane Adkins is a 71 y.o. year old, female patient, who comes today for a post-procedure evaluation. She has Altered mental status; Arteriosclerosis of coronary artery; Carpal tunnel syndrome (S/P Right side release) (Bilateral) (B) (R>L); Seizure ([place]); COPD (chronic obstructive pulmonary disease) ([place]); Dysphagia; Epilepsy ([place]); Acid reflux; H/O adenomatous polyp of colon; H/O infectious disease; Personal history of other infectious and parasitic diseases; HLD (hyperlipidemia); BP (high blood pressure); Hypothyroidism; Degeneration macular; Arthritis, degenerative; Presence of stent in coronary artery; Long term prescription benzodiazepine use; Long term current use of opiate analgesic; Long term prescription opiate use; Opiate use; Encounter for therapeutic drug level monitoring; Encounter for pain management planning; Chronic neck pain (Secondary source of pain) (Bilateral) (R>L); Chronic low back pain (Primary Source of Pain) (Bilateral) (R>L); Chronic upper back pain [facility] source of pain) (Bilateral) (R>L); Chronic Arm numbness (Bilateral) (R>L); Bilateral numbness and tingling of arms and legs (R>L); Failed back surgical syndrome (x7); History of left hip replacement (Depue recalled replacement); MRSA (methicillin resistant Staphylococcus aureus); Lumbar spondylosis; Cervical spondylosis; History of Helicobacter pylori infection; Shoulder pain; Chronic pain syndrome; Chronic thoracic spine pain; Numbness  of lower extremity (B); Long term current use of anticoagulant therapy; Lumbar facet syndrome (Bilateral) (R>L); and DDD (degenerative disc disease), lumbar on her problem list. Her primarily concern today is the Back Pain (lower right is worse)  Pain Assessment: Location: Lower, Right Back Radiating: radiates around to abdomen on both sides but right is worse.  Onset: More than a month ago Duration: Chronic pain Quality: Aching, Stabbing Severity: 8 /10 (self-reported pain score)  Note: Reported level is inconsistent with clinical observations. Clinically the patient looks like a 3/10 Information on the proper use of the pain scale provided to the patient today. Pain level appears to be "Moderate", defined here as significantly interfering with activities of daily living (ADL). It becomes difficult to feed, bathe, get dressed, get on and off the toilet or to perform personal hygiene functions. Difficult to get in and out of bed or a chair without assistance. Very distracting. With effort, it can be ignored when deeply involved in activities. Effect on ADL: unable to do much of anything, housework is challenging and needs to be done.  Timing: Constant Modifying factors: rest  Diane Adkins comes in today for post-procedure evaluation after the treatment done on 03/03/2017.  Further details on both, my assessment(s), as well as the proposed treatment plan, please see below.  Post-Procedure Assessment  03/03/2017 Procedure: Diagnostic bilateral lumbar facet block under fluoroscopic guidance and IV sedation  Pre-procedure pain score:  8/10 Post-procedure pain score: 0/10 (100% relief) Influential Factors: BMI: 25.58 kg/m Intra-procedural challenges: None observed.         Assessment challenges: None detected.              Reported side-effects: None.        Post-procedural adverse reactions or complications: None reported         Sedation: Sedation provided. When no sedatives are used, the  analgesic levels  obtained are directly associated to the effectiveness of the local anesthetics. However, when sedation is provided, the level of analgesia obtained during the initial 1 hour following the intervention, is believed to be the result of a combination of factors. These factors may include, but are not limited to: 1. The effectiveness of the local anesthetics used. 2. The effects of the analgesic(s) and/or anxiolytic(s) used. 3. The degree of discomfort experienced by the patient at the time of the procedure. 4. The patients ability and reliability in recalling and recording the events. 5. The presence and influence of possible secondary gains and/or psychosocial factors. Reported result: Relief experienced during the 1st hour after the procedure: 100 % (Ultra-Short Term Relief)            Interpretative annotation: Clinically appropriate result. Analgesia during this period is likely to be Local Anesthetic and/or IV Sedative (Analgesic/Anxiolytic) related.          Effects of local anesthetic: The analgesic effects attained during this period are directly associated to the localized infiltration of local anesthetics and therefore cary significant diagnostic value as to the etiological location, or anatomical origin, of the pain. Expected duration of relief is directly dependent on the pharmacodynamics of the local anesthetic used. Long-acting (4-6 hours) anesthetics used.  Reported result: Relief during the next 4 to 6 hour after the procedure: 100 % (Short-Term Relief)            Interpretative annotation: Clinically appropriate result. Analgesia during this period is likely to be Local Anesthetic-related.          Long-term benefit: Defined as the period of time past the expected duration of local anesthetics (1 hour for short-acting and 4-6 hours for long-acting). With the possible exception of prolonged sympathetic blockade from the local anesthetics, benefits during this period are  typically attributed to, or associated with, other factors such as analgesic sensory neuropraxia, antiinflammatory effects, or beneficial biochemical changes provided by agents other than the local anesthetics.  Reported result: Extended relief following procedure: 20 % (Long-Term Relief)            Interpretative annotation: Clinically appropriate result. Good relief. No permanent benefit expected. Inflammation plays a part in the etiology to the pain.          Current benefits: Defined as reported results that persistent at this point in time.   Analgesia: <25 %            Function: Somewhat improved ROM: Somewhat improved Interpretative annotation: Recurrence of symptoms. No permanent benefit expected. Effective diagnostic intervention.          Interpretation: Results would suggest a successful diagnostic intervention.                  Plan:  Consider diagnostic procedure # 2.       Laboratory Chemistry  Inflammation Markers (CRP: Acute Phase) (ESR: Chronic Phase) Lab Results  Component Value Date   CRP <0.50 03/11/2016   ESRSEDRATE 11 03/11/2016                 Renal Function Markers Lab Results  Component Value Date   BUN 20 03/11/2016   CREATININE 0.61 03/11/2016   GFRAA >60 03/11/2016   GFRNONAA >60 03/11/2016                 Hepatic Function Markers Lab Results  Component Value Date   AST 16 03/11/2016   ALT 12 (L) 03/11/2016   ALBUMIN 4.2 03/11/2016  ALKPHOS 68 03/11/2016                 Electrolytes Lab Results  Component Value Date   NA 140 03/11/2016   K 4.2 03/11/2016   CL 105 03/11/2016   CALCIUM 9.4 03/11/2016   MG 1.9 03/11/2016                 Neuropathy Markers Lab Results  Component Value Date   VITAMINB12 >7,500 (H) 03/11/2016                 Bone Pathology Markers Lab Results  Component Value Date   ALKPHOS 68 03/11/2016   25OHVITD1 33 03/11/2016   25OHVITD2 1.1 03/11/2016   25OHVITD3 32 03/11/2016   CALCIUM 9.4 03/11/2016                  Coagulation Parameters Lab Results  Component Value Date   PLT 253 12/15/2015                 Cardiovascular Markers Lab Results  Component Value Date   HGB 13.2 12/15/2015   HCT 40.8 12/15/2015                 Note: Lab results reviewed.  Recent Diagnostic Imaging Results  DG Lumbar Spine Complete W/Bend CLINICAL DATA:  71 year old female with chronic back pain. Initial encounter. EXAM: LUMBAR SPINE - COMPLETE WITH BENDING VIEWS COMPARISON:  03/11/2016 MR FINDINGS: Prominent scoliosis lower thoracic and upper lumbar spine convex right. Superimposed degenerative changes. Moderate T12-L1 disc space with left lateral osteophyte. L1-2 prominent left lateral disc space narrowing and osteophyte. L2-3 mild retrolisthesis.  Moderate disc space narrowing. L3-4 prior surgery with Ray cages in place. L4-5 mild retrolisthesis. Moderate disc space narrowing greater on the right. L5-S1 Facet degenerative changes. No abnormal motion with flexion extension. Prior left hip replacement. Vascular calcifications. IMPRESSION: Prominent scoliosis lower thoracic and upper lumbar spine convex right. T12-L1 moderate disc space narrowing with left lateral osteophyte. L1-2 moderate left-sided disc space narrowing with left lateral osteophyte. L2-3 mild retrolisthesis.  Moderate disc space narrowing. L3-4 prior surgery with Ray cages in place. L4-5 mild retrolisthesis. Moderate disc space narrowing greater on the right. L5-S1 facet degenerative changes. No abnormal motion with flexion extension. Electronically Signed   By: Genia Del M.D.   On: 01/25/2017 20:10  DG Cervical Spine Complete CLINICAL DATA:  71 year old female with chronic neck pain. No reported trauma. Initial encounter. EXAM: CERVICAL SPINE - COMPLETE 4+ VIEW COMPARISON:  03/11/2016 plain film exam and 01/27/2011 CT. FINDINGS: Prior discectomy and anterior fusion C4-T1. Anterior plate and screws A2-Z3 and  C7-T1. 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 Prominent C3-4 disc degeneration, disc space narrowing, osteophyte and mild retrolisthesis. C3-4 mild to moderate left-sided and minimal to mild right-sided foraminal narrowing. C6-7 moderate left-sided and mild right-sided foraminal narrowing. Mild C1-2 degenerative changes. Lung apices clear. No abnormal prevertebral soft tissue swelling. IMPRESSION: Prior discectomy and anterior fusion C4-T1. Anterior plate and screws Y8-M5 and C7-T1. 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 Prominent C3-4 disc degeneration, disc space narrowing, osteophyte and mild retrolisthesis. C3-4 mild to moderate left-sided and minimal to mild right-sided foraminal narrowing. C6-7 moderate left-sided and mild right-sided foraminal narrowing. Mild C1-2 degenerative changes. Electronically Signed   By: Genia Del M.D.   On: 01/25/2017 20:37  Note: Results of ordered imaging test(s) reviewed and explained to patient in Layman's terms.          Meds  Current Outpatient Prescriptions:  .  CALCIUM-MAGNESIUM-ZINC PO, Take 1 tablet by mouth 2 (two) times daily., Disp: , Rfl:  .  citalopram (CELEXA) 10 MG tablet, Take 10 mg by mouth daily., Disp: , Rfl:  .  clopidogrel (PLAVIX) 75 MG tablet, Take 75 mg by mouth daily., Disp: , Rfl:  .  diphenhydrAMINE (BENADRYL) 25 MG tablet, Take 25 mg by mouth every 6 (six) hours as needed., Disp: , Rfl:  .  levETIRAcetam (KEPPRA) 1000 MG tablet, Take 500 mg by mouth 2 (two) times daily. , Disp: , Rfl:  .  levothyroxine (SYNTHROID, LEVOTHROID) 100 MCG tablet, Take 88 mcg by mouth daily before breakfast. , Disp: , Rfl:  .  metoprolol tartrate (LOPRESSOR) 25 MG tablet, Take 25 mg by mouth 2 (two) times daily., Disp: , Rfl:  .  Multiple Vitamins-Minerals (ICAPS AREDS 2 PO), Take 1 tablet by mouth 2 (two) times daily., Disp: , Rfl:  .  omeprazole (PRILOSEC) 20 MG capsule, Take by mouth., Disp: , Rfl:  .  PROAIR HFA 108  (90 Base) MCG/ACT inhaler, 2 puffs every 4 (four) hours as needed. , Disp: , Rfl:  .  Vitamin D, Ergocalciferol, (DRISDOL) 50000 units CAPS capsule, 50,000 Units every 7 (seven) days. , Disp: , Rfl:  .  alendronate (FOSAMAX) 70 MG tablet, 70 mg every 30 (thirty) days. , Disp: , Rfl:  .  aspirin 81 MG tablet, Take 81 mg by mouth daily., Disp: , Rfl:  .  calcium carbonate 1250 MG capsule, Take 1,250 mg by mouth 2 (two) times daily with a meal., Disp: , Rfl:  .  cyclobenzaprine (FLEXERIL) 10 MG tablet, Take 10 mg by mouth 2 (two) times daily. , Disp: , Rfl:  .  Loratadine 10 MG CAPS, Take by mouth., Disp: , Rfl:  .  PARoxetine (PAXIL) 20 MG tablet, Take 20 mg by mouth daily., Disp: , Rfl:   ROS  Constitutional: Denies any fever or chills Gastrointestinal: No reported hemesis, hematochezia, vomiting, or acute GI distress Musculoskeletal: Denies any acute onset joint swelling, redness, loss of ROM, or weakness Neurological: No reported episodes of acute onset apraxia, aphasia, dysarthria, agnosia, amnesia, paralysis, loss of coordination, or loss of consciousness  Allergies  Ms. Karp is allergic to keflex [cephalexin]; lamotrigine; macrodantin [nitrofurantoin macrocrystal]; nitrofurantoin; and statins.  PFSH  Drug: Ms. Buckhalter  reports that she does not use drugs. Alcohol:  reports that she does not drink alcohol. Tobacco:  reports that she quit smoking about 13 months ago. She smoked 0.50 packs per day. She has never used smokeless tobacco. 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 Surgical: Ms. Mergen  has a past surgical history that includes Cervical spine surgery; rotator cuff surg (Right); Abdominal hysterectomy; cardiac stents (2013); Back surgery; Coronary angioplasty; Joint replacement (Left, 2007); Breast surgery; Cataract extraction w/PHACO (Left, 03/30/2016); Cataract extraction w/PHACO (Right, 04/27/2016); and Eye surgery. Family: family history includes Cancer in her father; Heart disease in her mother.  Constitutional Exam  General appearance: Well nourished, well developed, and well hydrated. In no apparent acute distress Vitals:   04/04/17 1344  BP: 134/77  Pulse: 93  Resp: 16  Temp: 98 F (  36.7 C)  TempSrc: Oral  SpO2: 98%  Weight: 131 lb (59.4 kg)  Height: 5' (1.524 m)   BMI Assessment: Estimated body mass index is 25.58 kg/m as calculated from the following:   Height as of this encounter: 5' (1.524 m).   Weight as of this encounter: 131 lb (59.4 kg).  BMI interpretation table: BMI level Category Range association with higher incidence of chronic pain  <18 kg/m2 Underweight   18.5-24.9 kg/m2 Ideal body weight   25-29.9 kg/m2 Overweight Increased incidence by 20%  30-34.9 kg/m2 Obese (Class I) Increased incidence by 68%  35-39.9 kg/m2 Severe obesity (Class II) Increased incidence by 136%  >40 kg/m2 Extreme obesity (Class III) Increased incidence by 254%   BMI Readings from Last 4 Encounters:  04/04/17 25.58 kg/m  03/03/17 23.62 kg/m  01/25/17 23.41 kg/m  05/17/16 21.03 kg/m   Wt Readings from Last 4 Encounters:  04/04/17 131 lb (59.4 kg)  03/03/17 125 lb (56.7 kg)  01/25/17 128 lb (58.1 kg)  05/17/16 115 lb (52.2 kg)  Psych/Mental status: Alert, oriented x 3 (person, place, & time)       Eyes: PERLA Respiratory: No evidence of acute respiratory distress  Cervical Spine Area Exam  Skin & Axial Inspection: No masses, redness, edema, swelling, or associated skin  lesions Alignment: Symmetrical Functional ROM: Unrestricted ROM      Stability: No instability detected Muscle Tone/Strength: Functionally intact. No obvious neuro-muscular anomalies detected. Sensory (Neurological): Unimpaired Palpation: No palpable anomalies              Upper Extremity (UE) Exam    Side: Right upper extremity  Side: Left upper extremity  Skin & Extremity Inspection: Skin color, temperature, and hair growth are WNL. No peripheral edema or cyanosis. No masses, redness, swelling, asymmetry, or associated skin lesions. No contractures.  Skin & Extremity Inspection: Skin color, temperature, and hair growth are WNL. No peripheral edema or cyanosis. No masses, redness, swelling, asymmetry, or associated skin lesions. No contractures.  Functional ROM: Unrestricted ROM          Functional ROM: Unrestricted ROM          Muscle Tone/Strength: Functionally intact. No obvious neuro-muscular anomalies detected.  Muscle Tone/Strength: Functionally intact. No obvious neuro-muscular anomalies detected.  Sensory (Neurological): Unimpaired          Sensory (Neurological): Unimpaired          Palpation: No palpable anomalies              Palpation: No palpable anomalies              Specialized Test(s): Deferred         Specialized Test(s): Deferred          Thoracic Spine Area Exam  Skin & Axial Inspection: prominent thoracic Kyphosis Alignment: Symmetrical Functional ROM: Minimal ROM Stability: No instability detected Muscle Tone/Strength: Functionally intact. No obvious neuro-muscular anomalies detected. Sensory (Neurological): Movement associated pain Muscle strength & Tone: Complains of area being tender to palpation  Lumbar Spine Area Exam  Skin & Axial Inspection: Lumbar Scoliosis Alignment: Symmetrical Functional ROM: Minimal ROM      Stability: No instability detected Muscle Tone/Strength: Functionally intact. No obvious neuro-muscular anomalies detected. Sensory  (Neurological): Movement-associated pain Palpation: Complains of area being tender to palpation       Provocative Tests: Lumbar Hyperextension and rotation test: evaluation deferred today       Lumbar Lateral bending test: evaluation deferred today  Patrick's Maneuver: evaluation deferred today                    Gait & Posture Assessment  Ambulation: Patient ambulates using a walker Gait: Very limited, using assistive device to ambulate Posture: Antalgic   Lower Extremity Exam    Side: Right lower extremity  Side: Left lower extremity  Skin & Extremity Inspection: Skin color, temperature, and hair growth are WNL. No peripheral edema or cyanosis. No masses, redness, swelling, asymmetry, or associated skin lesions. No contractures.  Skin & Extremity Inspection: Skin color, temperature, and hair growth are WNL. No peripheral edema or cyanosis. No masses, redness, swelling, asymmetry, or associated skin lesions. No contractures.  Functional ROM: Unrestricted ROM          Functional ROM: Unrestricted ROM          Muscle Tone/Strength: Functionally intact. No obvious neuro-muscular anomalies detected.  Muscle Tone/Strength: Functionally intact. No obvious neuro-muscular anomalies detected.  Sensory (Neurological): Unimpaired  Sensory (Neurological): Unimpaired  Palpation: No palpable anomalies  Palpation: No palpable anomalies   Assessment  Primary Diagnosis & Pertinent Problem List: The primary encounter diagnosis was Chronic low back pain (Primary Source of Pain) (Bilateral) (R>L). Diagnoses of Lumbar facet syndrome (Bilateral) (R>L), Chronic neck pain (Secondary source of pain) (Bilateral) (R>L), Chronic upper back pain [facility] source of pain) (Bilateral) (R>L), Chronic thoracic spine pain, Chronic pain syndrome, and Long term current use of anticoagulant therapy were also pertinent to this visit.  Status Diagnosis  Persistent Persistent Controlled 1. Chronic low back pain (Primary  Source of Pain) (Bilateral) (R>L)   2. Lumbar facet syndrome (Bilateral) (R>L)   3. Chronic neck pain (Secondary source of pain) (Bilateral) (R>L)   4. Chronic upper back pain [facility] source of pain) (Bilateral) (R>L)   5. Chronic thoracic spine pain   6. Chronic pain syndrome   7. Long term current use of anticoagulant therapy     Problems updated and reviewed during this visit: No problems updated. Plan of Care  Pharmacotherapy (Medications Ordered): No orders of the defined types were placed in this encounter.  New Prescriptions   No medications on file   Medications administered today: Ms. Cordone had no medications administered during this visit.   Procedure Orders     LUMBAR FACET(MEDIAL BRANCH NERVE BLOCK) [facility] Lab Orders  No laboratory test(s) ordered today   Imaging Orders  No imaging studies ordered today   Referral Orders  No referral(s) requested today    Interventional management options: Planned, scheduled, and/or pending:   Note: Stop Plavix 7-10 days prior to procedures. Diagnostic bilateral lumbar facet block under fluoroscopic guidance and IV sedation    Considering:   Note: Stop Plavix 7-10 days prior to procedures. Diagnostic bilateral lumbar facet block under fluoroscopic guidance and IV sedation  Diagnostic caudal epidural steroid injection + diagnostic epidurogram  Possible Racz procedure.  Diagnostic bilateral lumbar facet blocks. Because of the patient's extensive hardware, she is not likely to be a good candidate for radiofrequency.    Palliative PRN treatment(s):   None at this time   Provider-requested follow-up: Return for Procedure (with sedation): (B) L-FCT Blk, (Blood-thinner Protocol).  No future appointments. Primary Care Physician: Casilda Carls, MD Location: [facility] Note by: Gaspar Cola, MD Date: 04/04/2017; Time: 2:33 PM

## 2017-04-04 ENCOUNTER — Encounter: Payer: Self-pay | Admitting: Pain Medicine

## 2017-04-04 ENCOUNTER — Ambulatory Visit: Payer: Medicare Other | Attending: Pain Medicine | Admitting: Pain Medicine

## 2017-04-04 VITALS — BP 134/77 | HR 93 | Temp 98.0°F | Resp 16 | Ht 60.0 in | Wt 131.0 lb

## 2017-04-04 DIAGNOSIS — G894 Chronic pain syndrome: Secondary | ICD-10-CM

## 2017-04-04 DIAGNOSIS — M546 Pain in thoracic spine: Secondary | ICD-10-CM

## 2017-04-04 DIAGNOSIS — M545 Low back pain: Secondary | ICD-10-CM

## 2017-04-04 DIAGNOSIS — M549 Dorsalgia, unspecified: Secondary | ICD-10-CM

## 2017-04-04 DIAGNOSIS — M4696 Unspecified inflammatory spondylopathy, lumbar region: Secondary | ICD-10-CM

## 2017-04-04 DIAGNOSIS — M542 Cervicalgia: Secondary | ICD-10-CM

## 2017-04-04 DIAGNOSIS — Z7901 Long term (current) use of anticoagulants: Secondary | ICD-10-CM

## 2017-04-04 DIAGNOSIS — M47816 Spondylosis without myelopathy or radiculopathy, lumbar region: Secondary | ICD-10-CM

## 2017-04-04 DIAGNOSIS — G8929 Other chronic pain: Secondary | ICD-10-CM

## 2017-04-04 NOTE — Patient Instructions (Addendum)
___You have been given pre procedue instructions.  Do not eat or drink for 8 hours prior to procedure.  Bring a driver.  Take blood pressure med with a small sip of water.  Stop Plavix for 7 days prior to procedure.   _____________________________________________________________________________________  Pain Scale  Introduction: The pain score used by this practice is the Verbal Numerical Rating Scale (VNRS-11). This is an 11-point scale. It is for adults and children 10 years or older. There are significant differences in how the pain score is reported, used, and applied. Forget everything you learned in the past and learn this scoring system.  General Information: The scale should reflect your current level of pain. Unless you are specifically asked for the level of your worst pain, or your average pain. If you are asked for one of these two, then it should be understood that it is over the past 24 hours.  Basic Activities of Daily Living (ADL): Personal hygiene, dressing, eating, transferring, and using restroom.  Instructions: Most patients tend to report their level of pain as a combination of two factors, their physical pain and their psychosocial pain. This last one is also known as "suffering" and it is reflection of how physical pain affects you socially and psychologically. From now on, report them separately. From this point on, when asked to report your pain level, report only your physical pain. Use the following table for reference.  Pain Clinic Pain Levels (0-5/10)  Pain Level Score  Description  No Pain 0   Mild pain 1 Nagging, annoying, but does not interfere with basic activities of daily living (ADL). Patients are able to eat, bathe, get dressed, toileting (being able to get on and off the toilet and perform personal hygiene functions), transfer (move in and out of bed or a chair without assistance), and maintain continence (able to control bladder and bowel functions). Blood  pressure and heart rate are unaffected. A normal heart rate for a healthy adult ranges from 60 to 100 bpm (beats per minute).   Mild to moderate pain 2 Noticeable and distracting. Impossible to hide from other people. More frequent flare-ups. Still possible to adapt and function close to normal. It can be very annoying and may have occasional stronger flare-ups. With discipline, patients may get used to it and adapt.   Moderate pain 3 Interferes significantly with activities of daily living (ADL). It becomes difficult to feed, bathe, get dressed, get on and off the toilet or to perform personal hygiene functions. Difficult to get in and out of bed or a chair without assistance. Very distracting. With effort, it can be ignored when deeply involved in activities.   Moderately severe pain 4 Impossible to ignore for more than a few minutes. With effort, patients may still be able to manage work or participate in some social activities. Very difficult to concentrate. Signs of autonomic nervous system discharge are evident: dilated pupils (mydriasis); mild sweating (diaphoresis); sleep interference. Heart rate becomes elevated (>115 bpm). Diastolic blood pressure (lower number) rises above 100 mmHg. Patients find relief in laying down and not moving.   Severe pain 5 Intense and extremely unpleasant. Associated with frowning face and frequent crying. Pain overwhelms the senses.  Ability to do any activity or maintain social relationships becomes significantly limited. Conversation becomes difficult. Pacing back and forth is common, as getting into a comfortable position is nearly impossible. Pain wakes you up from deep sleep. Physical signs will be obvious: pupillary dilation; increased sweating; goosebumps; brisk  reflexes; cold, clammy hands and feet; nausea, vomiting or dry heaves; loss of appetite; significant sleep disturbance with inability to fall asleep or to remain asleep. When persistent, significant  weight loss is observed due to the complete loss of appetite and sleep deprivation.  Blood pressure and heart rate becomes significantly elevated. Caution: If elevated blood pressure triggers a pounding headache associated with blurred vision, then the patient should immediately seek attention at an urgent or emergency care unit, as these may be signs of an impending stroke.    Emergency Department Pain Levels (6-10/10)  Emergency Room Pain 6 Severely limiting. Requires emergency care and should not be seen or managed at an outpatient pain management facility. Communication becomes difficult and requires great effort. Assistance to reach the emergency department may be required. Facial flushing and profuse sweating along with potentially dangerous increases in heart rate and blood pressure will be evident.   Distressing pain 7 Self-care is very difficult. Assistance is required to transport, or use restroom. Assistance to reach the emergency department will be required. Tasks requiring coordination, such as bathing and getting dressed become very difficult.   Disabling pain 8 Self-care is no longer possible. At this level, pain is disabling. The individual is unable to do even the most "basic" activities such as walking, eating, bathing, dressing, transferring to a bed, or toileting. Fine motor skills are lost. It is difficult to think clearly.   Incapacitating pain 9 Pain becomes incapacitating. Thought processing is no longer possible. Difficult to remember your own name. Control of movement and coordination are lost.   The worst pain imaginable 10 At this level, most patients pass out from pain. When this level is reached, collapse of the autonomic nervous system occurs, leading to a sudden drop in blood pressure and heart rate. This in turn results in a temporary and dramatic drop in blood flow to the brain, leading to a loss of consciousness. Fainting is one of the body's self defense mechanisms.  Passing out puts the brain in a calmed state and causes it to shut down for a while, in order to begin the healing process.    Summary: 1. Refer to this scale when providing [place] with your pain level. 2. Be accurate and careful when reporting your pain level. This will help with your care. 3. Over-reporting your pain level will lead to loss of credibility. 4. Even a level of 1/10 means that there is pain and will be treated at our facility. 5. High, inaccurate reporting will be documented as "Symptom Exaggeration", leading to loss of credibility and suspicions of possible secondary gains such as obtaining more narcotics, or wanting to appear disabled, for fraudulent reasons. 6. Only pain levels of 5 or below will be seen at our facility. 7. Pain levels of 6 and above will be sent to the Emergency Department and the appointment cancelled. ____________________________________________________________________________________________  Preparing for Procedure with Sedation Instructions: . Oral Intake: Do not eat or drink anything for at least 8 hours prior to your procedure. . Transportation: Public transportation is not allowed. Bring an adult driver. The driver must be physically present in our waiting room before any procedure can be started. [place] Physical Assistance: Bring an adult capable of physically assisting you, in the event you need help. . Blood Pressure Medicine: Take your blood pressure medicine with a sip of water the morning of the procedure. . Insulin: Take only  of your normal insulin dose. . Preventing infections: Shower with an antibacterial soap  the morning of your procedure. . Build-up your immune system: Take 1000 mg of Vitamin C with every meal (3 times a day) the day prior to your procedure. . Pregnancy: If you are pregnant, call and cancel the procedure. . Sickness: If you have a cold, fever, or any active infections, call and cancel the procedure. . Arrival: You must be in  the facility at least 30 minutes prior to your scheduled procedure. . Children: Do not bring children with you. . Dress appropriately: Bring dark clothing that you would not mind if they get stained. . Valuables: Do not bring any jewelry or valuables. Procedure appointments are reserved for interventional treatments only. [place] No Prescription Refills. . No medication changes will be discussed during procedure appointments. No disability issues will be discussed.Facet Blocks Patient Information  Description: The facets are joints in the spine between the vertebrae.  Like any joints in the body, facets can become irritated and painful.  Arthritis can also effect the facets.  By injecting steroids and local anesthetic in and around these joints, we can temporarily block the nerve supply to them.  Steroids act directly on irritated nerves and tissues to reduce selling and inflammation which often leads to decreased pain.  Facet blocks may be done anywhere along the spine from the neck to the low back depending upon the location of your pain.   After numbing the skin with local anesthetic (like Novocaine), a small needle is passed onto the facet joints under x-ray guidance.  You may experience a sensation of pressure while this is being done.  The entire block usually lasts about 15-25 minutes.   Conditions which may be treated by facet blocks:   Low back/buttock pain  Neck/shoulder pain  Certain types of headaches  Preparation for the injection:  1. Do not eat any solid food or dairy products within 8 hours of your appointment. 2. You may drink clear liquid up to 3 hours before appointment.  Clear liquids include water, black coffee, juice or soda.  No milk or cream please. 3. You may take your regular medication, including pain medications, with a sip of water before your appointment.  Diabetics should hold regular insulin (if taken separately) and take 1/2 normal NPH dose the morning of the  procedure.  Carry some sugar containing items with you to your appointment. 4. A driver must accompany you and be prepared to drive you home after your procedure. 5. Bring all your current medications with you. 6. An IV may be inserted and sedation may be given at the discretion of the physician. 7. A blood pressure cuff, EKG and other monitors will often be applied during the procedure.  Some patients may need to have extra oxygen administered for a short period. 8. You will be asked to provide medical information, including your allergies and medications, prior to the procedure.  We must know immediately if you are taking blood thinners (like Coumadin/Warfarin) or if you are allergic to IV iodine contrast (dye).  We must know if you could possible be pregnant.  Possible side-effects:   Bleeding from needle site  Infection (rare, may require surgery)  Nerve injury (rare)  Numbness & tingling (temporary)  Difficulty urinating (rare, temporary)  Spinal headache (a headache worse with upright posture)  Light-headedness (temporary)  Pain at injection site (serveral days)  Decreased blood pressure (rare, temporary)  Weakness in arm/leg (temporary)  Pressure sensation in back/neck (temporary)   Call if you experience:   Fever/chills associated  with headache or increased back/neck pain  Headache worsened by an upright position  New onset, weakness or numbness of an extremity below the injection site  Hives or difficulty breathing (go to the emergency room)  Inflammation or drainage at the injection site(s)  Severe back/neck pain greater than usual  New symptoms which are concerning to you  Please note:  Although the local anesthetic injected can often make your back or neck feel good for several hours after the injection, the pain will likely return. It takes 3-7 days for steroids to work.  You may not notice any pain relief for at least one week.  If effective, we  will often do a series of 2-3 injections spaced 3-6 weeks apart to maximally decrease your pain.  After the initial series, you may be a candidate for a more permanent nerve block of the facets.  If you have any questions, please call #[phone]) [facility]

## 2017-04-07 ENCOUNTER — Other Ambulatory Visit: Payer: Self-pay | Admitting: Internal Medicine

## 2017-04-07 DIAGNOSIS — Z1231 Encounter for screening mammogram for malignant neoplasm of breast: Secondary | ICD-10-CM

## 2017-04-07 NOTE — Progress Notes (Signed)
Results were reviewed and found to be: significantly abnormal  No acute injury or pathology identified  Review would suggest interventional pain management techniques may be of benefit

## 2018-02-24 ENCOUNTER — Encounter: Payer: Self-pay | Admitting: Emergency Medicine

## 2018-02-24 ENCOUNTER — Emergency Department
Admission: EM | Admit: 2018-02-24 | Discharge: 2018-02-24 | Disposition: A | Discharge status: Home / Self Care | Payer: Medicare Other | Attending: Emergency Medicine | Admitting: Emergency Medicine

## 2018-02-24 ENCOUNTER — Emergency Department: Payer: Medicare Other

## 2018-02-24 ENCOUNTER — Other Ambulatory Visit: Payer: Self-pay

## 2018-02-24 DIAGNOSIS — I2510 Atherosclerotic heart disease of native coronary artery without angina pectoris: Secondary | ICD-10-CM | POA: Diagnosis not present

## 2018-02-24 DIAGNOSIS — R109 Unspecified abdominal pain: Secondary | ICD-10-CM

## 2018-02-24 DIAGNOSIS — Z79899 Other long term (current) drug therapy: Secondary | ICD-10-CM | POA: Insufficient documentation

## 2018-02-24 DIAGNOSIS — Z7902 Long term (current) use of antithrombotics/antiplatelets: Secondary | ICD-10-CM | POA: Diagnosis not present

## 2018-02-24 DIAGNOSIS — I10 Essential (primary) hypertension: Secondary | ICD-10-CM | POA: Insufficient documentation

## 2018-02-24 DIAGNOSIS — J449 Chronic obstructive pulmonary disease, unspecified: Secondary | ICD-10-CM | POA: Diagnosis not present

## 2018-02-24 DIAGNOSIS — Z96642 Presence of left artificial hip joint: Secondary | ICD-10-CM | POA: Insufficient documentation

## 2018-02-24 DIAGNOSIS — Z87891 Personal history of nicotine dependence: Secondary | ICD-10-CM | POA: Insufficient documentation

## 2018-02-24 DIAGNOSIS — R1031 Right lower quadrant pain: Secondary | ICD-10-CM | POA: Diagnosis present

## 2018-02-24 DIAGNOSIS — E039 Hypothyroidism, unspecified: Secondary | ICD-10-CM | POA: Diagnosis not present

## 2018-02-24 DIAGNOSIS — Z7982 Long term (current) use of aspirin: Secondary | ICD-10-CM | POA: Insufficient documentation

## 2018-02-24 LAB — COMPREHENSIVE METABOLIC PANEL
ALK PHOS: 81 U/L (ref 38–126)
ALT: 8 U/L (ref 0–44)
AST: 16 U/L (ref 15–41)
Albumin: 4.2 g/dL (ref 3.5–5.0)
Anion gap: 10 (ref 5–15)
BILIRUBIN TOTAL: 0.5 mg/dL (ref 0.3–1.2)
BUN: 23 mg/dL (ref 8–23)
CALCIUM: 9.3 mg/dL (ref 8.9–10.3)
CO2: 24 mmol/L (ref 22–32)
CREATININE: 1.08 mg/dL — AB (ref 0.44–1.00)
Chloride: 108 mmol/L (ref 98–111)
GFR, EST AFRICAN AMERICAN: 58 mL/min — AB (ref >60)
GFR, EST NON AFRICAN AMERICAN: 50 mL/min — AB (ref >60)
Glucose, Bld: 134 mg/dL — ABNORMAL HIGH (ref 70–99)
Potassium: 4.1 mmol/L (ref 3.5–5.1)
Sodium: 142 mmol/L (ref 135–145)
TOTAL PROTEIN: 7.8 g/dL (ref 6.5–8.1)

## 2018-02-24 LAB — CBC
HCT: 42.3 % (ref 35.0–47.0)
Hemoglobin: 14.3 g/dL (ref 12.0–16.0)
MCH: 28.4 pg (ref 26.0–34.0)
MCHC: 33.7 g/dL (ref 32.0–36.0)
MCV: 84.3 fL (ref 80.0–100.0)
Platelets: 344 10*3/uL (ref 150–440)
RBC: 5.02 MIL/uL (ref 3.80–5.20)
RDW: 13.6 % (ref 11.5–14.5)
WBC: 12.5 10*3/uL — AB (ref 3.6–11.0)

## 2018-02-24 LAB — URINALYSIS, COMPLETE (UACMP) WITH MICROSCOPIC
BILIRUBIN URINE: NEGATIVE
Bacteria, UA: NONE SEEN
Glucose, UA: NEGATIVE mg/dL
Ketones, ur: NEGATIVE mg/dL
LEUKOCYTES UA: NEGATIVE
Nitrite: NEGATIVE
PH: 5 (ref 5.0–8.0)
Protein, ur: NEGATIVE mg/dL

## 2018-02-24 MED ORDER — MORPHINE SULFATE (PF) 4 MG/ML IV SOLN
4.0000 mg | Freq: Once | INTRAVENOUS | Status: AC
  Administered 2018-02-24: 4 mg via INTRAVENOUS
  Filled 2018-02-24: qty 1

## 2018-02-24 MED ORDER — IOPAMIDOL (ISOVUE-300) INJECTION 61%
100.0000 mL | Freq: Once | INTRAVENOUS | Status: AC | PRN
  Administered 2018-02-24: 100 mL via INTRAVENOUS

## 2018-02-24 NOTE — ED Triage Notes (Addendum)
Pt presents to ED with back and right flank pain. Pt states she has a hx of chronic back pain. Recent MRI showed multiple kidney cysts and pt states her pain has continued to increase for the past several weeks. Pt has had 7 back surgeries and states she needs to have another but her MD wont do it again. Pt is unsure if she has a urinary infection.

## 2018-02-24 NOTE — ED Provider Notes (Signed)
[facility] Emergency Department Provider Note   ____________________________________________   I have reviewed the triage vital signs and the nursing notes.   HISTORY  Chief Complaint Back Pain and Flank Pain   History limited by: Not Limited   HPI Diane Adkins is a 72 y.o. female who presents to the emergency department today because of concern for right flank pain. Patient has a history of chronic back pain and has had multiple surgeries. She states she came in tonight because the pain became more severe. She does take opioid pain medications at home but states that they do not work. She has not noticed any fevers. No change in urination and defecation.    Per medical record review patient has a history of chronic pain syndrome.   Past Medical History:  Diagnosis Date  . Arthritis    ra  . Cardiomegaly   . Cataract   . COPD (chronic obstructive pulmonary disease) ([place])   . Coronary artery disease   . Depression   . Diverticulitis   . Epilepsy ([place])   . Fibromyalgia   . GERD (gastroesophageal reflux disease)   . HOH (hard of hearing)    aids  . Hypertension   . Hypothyroidism   . Inflammatory bowel disease   . Lupus ([place])   . 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 MRSA (methicillin resistant staph aureus) culture positive 1990's  . Opioid dependence ([place])   . Pain    chronic back and neck  . Seizures ([place])    last 2016  . Spinal disorder    stenosis  . Thyroid disease    hypothyroid    Patient Active Problem List   Diagnosis Date Noted  . Lumbar facet syndrome (Bilateral) (R>L) 03/03/2017  . DDD (degenerative disc disease), lumbar 03/03/2017  . Chronic pain syndrome 01/25/2017  . Chronic thoracic spine pain 01/25/2017  . Numbness of lower extremity (B) 01/25/2017  . Long term current use of anticoagulant therapy 01/25/2017  . History of Helicobacter pylori infection 01/24/2017  . Shoulder pain 10/19/2016  . COPD (chronic  obstructive pulmonary disease) ([place]) 02/17/2016  . Epilepsy ([place]) 02/17/2016  . H/O adenomatous polyp of colon 02/17/2016  . H/O infectious disease 02/17/2016  . Personal history of other infectious and parasitic diseases 02/17/2016  . HLD (hyperlipidemia) 02/17/2016  . Hypothyroidism 02/17/2016  . Degeneration macular 02/17/2016  . Arthritis, degenerative 02/17/2016  . Presence of stent in coronary artery 02/17/2016  . Long term prescription benzodiazepine use 02/17/2016  . Long term current use of opiate analgesic 02/17/2016  . Long term prescription opiate use 02/17/2016  . Opiate use 02/17/2016  . Encounter for therapeutic drug level monitoring 02/17/2016  . Encounter for pain management planning 02/17/2016  . Chronic neck pain (Secondary source of pain) (Bilateral) (R>L) 02/17/2016  . Chronic low back pain (Primary Source of Pain) (Bilateral) (R>L) 02/17/2016  . Chronic upper back pain [facility] source of pain) (Bilateral) (R>L) 02/17/2016  . Chronic Arm numbness (Bilateral) (R>L) 02/17/2016  . Bilateral numbness and tingling of arms and legs (R>L) 02/17/2016  . 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 History of left hip replacement (Depue recalled replacement) 02/17/2016  . MRSA (methicillin resistant Staphylococcus aureus) 02/17/2016    Class: History of  . Lumbar spondylosis 02/17/2016  . Cervical spondylosis 02/17/2016  . Altered mental status 12/16/2015  . Dysphagia 01/16/2014  . Carpal tunnel syndrome (S/P Right side release) (Bilateral) (B) (R>L) 11/15/2013  . Arteriosclerosis of coronary artery  10/17/2013  . Acid reflux 10/17/2013  . BP (high blood pressure) 10/17/2013  . Seizure ([place]) 05/26/2009    Past Surgical History:  Procedure Laterality Date  . ABDOMINAL HYSTERECTOMY    . BACK SURGERY     [age]   cervical fusion/thoracic/lumbar with ray cages  . BREAST SURGERY     implants  . cardiac stents  2013   x 2  . CATARACT EXTRACTION W/PHACO Left  03/30/2016   Procedure: CATARACT EXTRACTION PHACO AND INTRAOCULAR LENS PLACEMENT (IOC);  Surgeon: Birder Robson, MD;  Location: [facility] ORS;  Service: Ophthalmology;  Laterality: Left;  [place] 00:45 AP% 19.6 CDE 8.93 Fluid pack lot # [id number]  . CATARACT EXTRACTION W/PHACO Right 04/27/2016   Procedure: CATARACT EXTRACTION PHACO AND INTRAOCULAR LENS PLACEMENT (IOC);  Surgeon: Birder Robson, MD;  Location: [facility] ORS;  Service: Ophthalmology;  Laterality: Right;  Lot #[id number] H [place]: 00:39.1 AP%: 17.3 CDE: 6.76  . CERVICAL SPINE SURGERY    . CORONARY ANGIOPLASTY     stent x2  . EYE SURGERY    . JOINT REPLACEMENT Left 2007   hip  . rotator cuff surg Right     Prior to Admission medications   Medication Sig Start Date End Date Taking? Authorizing Provider  alendronate (FOSAMAX) 70 MG tablet 70 mg every 30 (thirty) days.  04/26/16   [provider]  aspirin 81 MG tablet Take 81 mg by mouth daily.    [provider]  calcium carbonate 1250 MG capsule Take 1,250 mg by mouth 2 (two) times daily with a meal.    [provider]  CALCIUM-MAGNESIUM-ZINC PO Take 1 tablet by mouth 2 (two) times daily.    [provider]  citalopram (CELEXA) 10 MG tablet Take 10 mg by mouth daily.    [provider]  clopidogrel (PLAVIX) 75 MG tablet Take 75 mg by mouth daily.    [provider]  cyclobenzaprine (FLEXERIL) 10 MG tablet Take 10 mg by mouth 2 (two) times daily.     [provider]  diphenhydrAMINE (BENADRYL) 25 MG tablet Take 25 mg by mouth every 6 (six) hours as needed.    [provider]  levETIRAcetam (KEPPRA) 1000 MG tablet Take 500 mg by mouth 2 (two) times daily.     [provider]  levothyroxine (SYNTHROID, LEVOTHROID) 100 MCG tablet Take 88 mcg by mouth daily before breakfast.     [provider]  Loratadine 10 MG CAPS Take by mouth.    [provider]  metoprolol tartrate (LOPRESSOR) 25 MG tablet Take  25 mg by mouth 2 (two) times daily.    [provider]  Multiple Vitamins-Minerals (ICAPS AREDS 2 PO) Take 1 tablet by mouth 2 (two) times daily.    [provider]  omeprazole (PRILOSEC) 20 MG capsule Take by mouth.    [provider]  PARoxetine (PAXIL) 20 MG tablet Take 20 mg by mouth daily.    [provider]  PROAIR HFA 108 (90 Base) MCG/ACT inhaler 2 puffs every 4 (four) hours as needed.  04/15/16   [provider]  Vitamin D, Ergocalciferol, (DRISDOL) 50000 units CAPS capsule 50,000 Units every 7 (seven) days.  05/11/16   [provider]    Allergies Keflex [cephalexin]; Lamotrigine; Macrodantin [nitrofurantoin macrocrystal]; Nitrofurantoin; and Statins  Family History  Problem Relation Age of Onset  . Heart disease Mother   . Cancer Father     Social History Social History   Tobacco Use  .  Smoking status: Former Smoker    Packs/day: 0.50    Last attempt to quit: 02/10/2016    Years since quitting: 2.0  . Smokeless tobacco: Never Used  Substance Use Topics  . Alcohol use: No  . Drug use: No    Review of Systems Constitutional: No fever/chills Eyes: No visual changes. ENT: No sore throat. Cardiovascular: Denies chest pain. Respiratory: Denies shortness of breath. Gastrointestinal: Positive for right flank pain.  Genitourinary: Negative for dysuria. Musculoskeletal: Negative for back pain. Skin: Negative for rash. Neurological: Negative for headaches, focal weakness or numbness.  ____________________________________________   PHYSICAL EXAM:  VITAL SIGNS: ED Triage Vitals  Enc Vitals Group     BP 02/24/18 0155 (!) 172/91     Pulse Rate 02/24/18 0155 (!) 125     Resp 02/24/18 0155 20     Temp 02/24/18 0155 97.9 F (36.6 C)     Temp Source 02/24/18 0155 Oral     SpO2 02/24/18 0155 94 %     Weight 02/24/18 0156 110 lb (49.9 kg)     Height 02/24/18 0156 5\' 2"  (1.575 m)     Head Circumference --      Peak  Flow --      Pain Score 02/24/18 0156 9   Constitutional: Alert and oriented.  Eyes: Conjunctivae are normal.  ENT      Head: Normocephalic and atraumatic.      Nose: No congestion/rhinnorhea.      Mouth/Throat: Mucous membranes are moist.      Neck: No stridor. Hematological/Lymphatic/Immunilogical: No cervical lymphadenopathy. Cardiovascular: Normal rate, regular rhythm.  No murmurs, rubs, or gallops.  Respiratory: Normal respiratory effort without tachypnea nor retractions. Breath sounds are clear and equal bilaterally. No wheezes/rales/rhonchi. Gastrointestinal: Soft and non tender. No rebound. No guarding.  Genitourinary: Deferred Musculoskeletal: sequale of chronic back disease noted in spine.  Neurologic:  Normal speech and language. No gross focal neurologic deficits are appreciated.  Skin:  Skin is warm, dry and intact. No rash noted. Psychiatric: Mood and affect are normal. Speech and behavior are normal. Patient exhibits appropriate insight and judgment.  ____________________________________________    LABS (pertinent positives/negatives)  CBC wbc 12.5, hgb 14.3, plt 344 CMP na 142, k 4.1, glu 134, cr 1.08 UA clear, small urine dipstick, 6-10 rbc, 0-5 wbc, bact none seen ____________________________________________   EKG  None  ____________________________________________    RADIOLOGY  CT abd/pel No obvious etiology of pain  ____________________________________________   PROCEDURES  Procedures  ____________________________________________   INITIAL IMPRESSION / ASSESSMENT AND PLAN / ED COURSE  Pertinent labs & imaging results that were available during my care of the patient were reviewed by me and considered in my medical decision making (see chart for details).   Presented to the emergency department today because of concerns for right sided pain.  Patient does have a history of some chronic pain issues.  Patient did have a mild leukocytosis he  had a some red blood cells in the urine.  CT abdomen pelvis was performed which did not show obvious etiology.  There is some dilatation of the common bile duct and intrahepatic ducts however patient's LFTs within normal limit I doubt that relates the patient's pain.  Additionally does show some aortic aneurysm which patient says she was aware of.  Discussed with patient's importance of continued primary care follow-up. She did feel better after medication administered in the ED.   ____________________________________________   FINAL CLINICAL IMPRESSION(S) / ED DIAGNOSES  Final diagnoses:  Right flank pain     Note: This dictation was prepared with Dragon dictation. Any transcriptional errors that result from this process are unintentional     Nance Pear, MD 02/24/18 [phone]

## 2018-02-24 NOTE — Discharge Instructions (Addendum)
Please seek medical attention for any high fevers, chest pain, shortness of breath, change in behavior, persistent vomiting, bloody stool or any other new or concerning symptoms.  

## 2018-02-25 LAB — URINE CULTURE: CULTURE: NO GROWTH

## 2018-07-26 ENCOUNTER — Other Ambulatory Visit: Payer: Self-pay | Admitting: Internal Medicine

## 2018-07-26 DIAGNOSIS — Z1231 Encounter for screening mammogram for malignant neoplasm of breast: Secondary | ICD-10-CM

## 2018-10-31 ENCOUNTER — Telehealth: Payer: Self-pay

## 2018-10-31 NOTE — Telephone Encounter (Signed)
The patient called and left a message for Dr. Dossie Arbour wanting to let him know that she stopped her fosamax and she is doing so much better. She said she is walking upright and is taking oxycontin 7.5 mg. She wanted Dr. Dossie Arbour to know that the fosamax is a deadly drug and he should be careful before prescribing it to anyone else, that the fosamax was her problem the whole time, that it was causing her pain.

## 2019-01-30 NOTE — Progress Notes (Signed)
01/31/2019 4:53 PM   Diane Adkins [birth year] [id number]  Referring provider: Casilda Carls, [place],  [place] [address]  Chief Complaint  Patient presents with  . Recurrent UTI    HPI: Diane Adkins is a 73 year old female who is referred to [place] by [facility] for persistent UTI.  She states that she is having back to back urinary tract over the last year.  She states her urinary tract pain in her side, nausea and having no appetite.  She states that her primary care physician told her to drink cranberry juice prevent further infections from occurring.     I only have one documented positive urine culture available to me at this visit.  [facility] urine culture from 01/17/2019 was positive for enterococcus faecalis resistant to tetracycline.  Her most recent infection took two rounds of Cipro to clear and she still feels she has an infection.    Today, she is having mid back pain that radiates to her lower abdomen.  Patient denies any gross hematuria, dysuria or suprapubic/flank pain.  Patient denies any fevers, chills, nausea or vomiting.   She has continual urinary leakage and wears pads consistently.    She underwent a hematuria work up in 1980's for having gross hematuria for 6 weeks, but she states they could not find a reason for her bleeding.    RUS on 09/14/2018 at [facility] noted asymmetrically small left kidney with simple subcentimeter left interpolar cyst. -Normal sonographic appearance of the right kidney.  No hydronephrosis.  Prominent echogenic material along the posterior bladder wall which does not appear to move with repositioning and without significant internal vascularity, query artifact from underdistention. This could be further evaluated with repeat ultrasound with maximally distended urinary bladder as clinically indicated.  Her UA today is positive for 3-10 RBC's.    PMH: Past Medical History:  Diagnosis Date  . Arthritis    ra  . Cardiomegaly   .  Cataract   . COPD (chronic obstructive pulmonary disease) ([place])   . Coronary artery disease   . Depression   . Diverticulitis   . Epilepsy ([place])   . Fibromyalgia   . GERD (gastroesophageal reflux disease)   . HOH (hard of hearing)    aids  . Hypertension   . Hypothyroidism   . Inflammatory bowel disease   . Lupus ([place])   . 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 MRSA (methicillin resistant staph aureus) culture positive 1990's  . Opioid dependence ([place])   . Pain    chronic back and neck  . Seizures ([place])    last 2016  . Spinal disorder    stenosis  . Thyroid disease    hypothyroid    Surgical History: Past Surgical History:  Procedure Laterality Date  . ABDOMINAL HYSTERECTOMY    . BACK SURGERY     [age]   cervical fusion/thoracic/lumbar with ray cages  . BREAST SURGERY     implants  . cardiac stents  2013   x 2  . CATARACT EXTRACTION W/PHACO Left 03/30/2016   Procedure: CATARACT EXTRACTION PHACO AND INTRAOCULAR LENS PLACEMENT (IOC);  Surgeon: Birder Robson, MD;  Location: [facility] ORS;  Service: Ophthalmology;  Laterality: Left;  [place] 00:45 AP% 19.6 CDE 8.93 Fluid pack lot # [id number]  . CATARACT EXTRACTION W/PHACO Right 04/27/2016   Procedure: CATARACT EXTRACTION PHACO AND INTRAOCULAR LENS PLACEMENT (IOC);  Surgeon: Birder Robson, MD;  Location: [facility] ORS;  Service: Ophthalmology;  Laterality: Right;  Lot #[id number] [place]: 00:39.1 AP%: 17.3 CDE: 6.76  . CERVICAL SPINE SURGERY    . CORONARY ANGIOPLASTY     stent x2  . EYE SURGERY    . JOINT REPLACEMENT Left 2007   hip  . rotator cuff surg Right     Home Medications:  Allergies as of 01/31/2019      Reactions   Keflex [cephalexin] Other (See Comments), Hives   Reaction: unknown   Lamotrigine Itching, Swelling   Macrodantin [nitrofurantoin Macrocrystal] Other (See Comments)   Reaction: unknown   Nitrofurantoin Hives   Statins Hives      Medication List       Accurate as of January 31, 2019  4:53 PM. If you  have any questions, ask your nurse or doctor.        STOP taking these medications   alendronate 70 MG tablet Commonly known as: FOSAMAX Stopped by: Lorine Iannaccone, PA-C   aspirin 81 MG tablet Stopped by: Ilyas Lipsitz, PA-C   citalopram 10 MG tablet Commonly known as: CELEXA Stopped by: Bane Hagy, PA-C   clopidogrel 75 MG tablet Commonly known as: PLAVIX Stopped by: Dacian Orrico, PA-C   cyclobenzaprine 10 MG tablet Commonly known as: FLEXERIL Stopped by: Lowry Bala, PA-C   PARoxetine 20 MG tablet Commonly known as: PAXIL Stopped by: Pearle Wandler, PA-C     TAKE these medications   calcium carbonate 1250 MG capsule Take 1,250 mg by mouth 2 (two) times daily with a meal.   CALCIUM-MAGNESIUM-ZINC PO Take 1 tablet by mouth 2 (two) times daily.   diphenhydrAMINE 25 MG tablet Commonly known as: BENADRYL Take 25 mg by mouth every 6 (six) hours as needed.   ICAPS AREDS 2 PO Take 1 tablet by mouth 2 (two) times daily.   levETIRAcetam 1000 MG tablet Commonly known as: KEPPRA Take 500 mg by mouth 2 (two) times daily.   levothyroxine 100 MCG tablet Commonly known as: SYNTHROID Take 88 mcg by mouth daily before breakfast.   Loratadine 10 MG Caps Take by mouth.   metoprolol tartrate 25 MG tablet Commonly known as: LOPRESSOR Take 25 mg by mouth 2 (two) times daily.   omeprazole 20 MG capsule Commonly known as: PRILOSEC Take by mouth.   ProAir HFA 108 (90 Base) MCG/ACT inhaler Generic drug: albuterol 2 puffs every 4 (four) hours as needed.   Vitamin D (Ergocalciferol) 1.25 MG (50000 UT) Caps capsule Commonly known as: DRISDOL 50,000 Units every 7 (seven) days.       Allergies:  Allergies  Allergen Reactions  . Keflex [Cephalexin] Other (See Comments) and Hives    Reaction: unknown  . Lamotrigine Itching and Swelling  . Macrodantin [Nitrofurantoin Macrocrystal] Other (See Comments)    Reaction: unknown  . Nitrofurantoin Hives  .  Statins Hives    Family History: Family History  Problem Relation Age of Onset  . Heart disease Mother   . Cancer Father     Social History:  reports that she quit smoking about 2 years ago. She smoked 0.50 packs per day. She has never used smokeless tobacco. She reports that she does not drink alcohol or use drugs.  ROS: UROLOGY Frequent Urination?: No Hard to postpone urination?: Yes Burning/pain with urination?: No Get up at night to urinate?: Yes Leakage of urine?: Yes Urine stream starts and stops?: No Trouble starting stream?: No Do you have to strain to urinate?: No Blood in urine?: No Urinary tract infection?: Yes Sexually transmitted disease?: No Injury to kidneys  or bladder?: No Painful intercourse?: No Weak stream?: No Currently pregnant?: No Vaginal bleeding?: No Last menstrual period?: n  Gastrointestinal Nausea?: Yes Vomiting?: No Indigestion/heartburn?: No Diarrhea?: No Constipation?: No  Constitutional Fever: No Night sweats?: Yes Weight loss?: Yes Fatigue?: Yes  Skin Skin rash/lesions?: No Itching?: Yes  Eyes Blurred vision?: Yes Double vision?: Yes  Ears/Nose/Throat Sore throat?: No Sinus problems?: No  Hematologic/Lymphatic Swollen glands?: No Easy bruising?: Yes  Cardiovascular Leg swelling?: Yes Chest pain?: Yes  Respiratory Cough?: No Shortness of breath?: Yes  Endocrine Excessive thirst?: No  Musculoskeletal Back pain?: Yes Joint pain?: Yes  Neurological Headaches?: No Dizziness?: No  Psychologic Depression?: Yes Anxiety?: Yes  Physical Exam: BP 109/67 (BP Location: Left Arm, Patient Position: Sitting, Cuff Size: Normal)   Pulse (!) 106   Ht '5\' 2"'$  (1.575 m)   Wt 115 lb (52.2 kg)   BMI 21.03 kg/m   Constitutional:  Well nourished. Alert and oriented, No acute distress. HEENT: [place] AT, moist mucus membranes.  Trachea midline, no masses. Cardiovascular: No clubbing, cyanosis, or edema. Respiratory: Normal  respiratory effort, no increased work of breathing. GI: Abdomen is soft, non tender, non distended, no abdominal masses.  GU: No CVA tenderness.  No bladder fullness or masses.   Neurologic: Grossly intact, no focal deficits, moving all 4 extremities. Psychiatric: Normal mood and affect.   Laboratory Data: Lab Results  Component Value Date   WBC 12.5 (H) 02/24/2018   HGB 14.3 02/24/2018   HCT 42.3 02/24/2018   MCV 84.3 02/24/2018   PLT 344 02/24/2018    Lab Results  Component Value Date   CREATININE 1.08 (H) 02/24/2018    No results found for: PSA  No results found for: TESTOSTERONE  No results found for: HGBA1C  No results found for: TSH  No results found for: CHOL, HDL, CHOLHDL, VLDL, LDLCALC  Lab Results  Component Value Date   AST 16 02/24/2018   Lab Results  Component Value Date   ALT 8 02/24/2018   No components found for: ALKALINEPHOPHATASE No components found for: BILIRUBINTOTAL  No results found for: ESTRADIOL  Urinalysis 3-10 RBC's.  See Epic. I have reviewed the labs.   Pertinent Imaging: -Asymmetrically small left kidney with simple subcentimeter left interpolar cyst. -Normal sonographic appearance of the right kidney. -No hydronephrosis. -Prominent echogenic material along the posterior bladder wall which does not appear to move with repositioning and without significant internal vascularity, query artifact from underdistention. This could be further evaluated with repeat ultrasound with maximally distended urinary bladder as clinically indicated.   Please see below for data measurements:  Right kidney: 9.6 cm Left kidney: 7.2 cm Bladder volume: 142.3 mL   Result Narrative  EXAM: US RENAL COMPLETE DATE: 09/14/2018 2:30 PM ACCESSION: [id number] UN DICTATED: 09/14/2018 2:44 PM INTERPRETATION LOCATION: [place]  CLINICAL INDICATION: 73 years old Female with other - R10.9 - Flank pain   COMPARISON: None.  TECHNIQUE: Static and cine  images of the kidneys and bladder were performed.  FINDINGS:  KIDNEYS: Asymmetrically small left kidney, similar to prior, and otherwise normal in echogenicity. No solid masses or calculi. No hydronephrosis. Simple appearing left interpolar cyst measuring 0.8 x 0.8 x 0.8 cm without definite internal vascularity.  BLADDER: There is prominent echogenic material along the posterior bladder wall which does not appear to move with repositioning and without significant internal vascularity, query artifact from underdistention. The remainder of the bladder is otherwise unremarkable.  Other Result Information    I have independently  reviewed the films.    Assessment & Plan:    1. Microscopic hematuria (high risk) I explained to the patient that there are a number of causes that can be associated with blood in the urine, such as stones, UTI's, damage to the urinary tract and/or cancer. At this time, I felt that the patient warranted a CTU and cystoscopy as she is at high risk for GU cancers I explained to the patient that a contrast material will be injected into a vein and that in rare instances, an allergic reaction can result and may even life threatening (1:100,000)  The patient denies any allergies to contrast, iodine and/or seafood and is not taking metformin. Following the imaging study,  I've recommended a cystoscopy. I described how this is performed, typically in an office setting with a flexible cystoscope. We described the risks, benefits, and possible side effects, the most common of which is a minor amount of blood in the urine and/or burning which usually resolves in 24 to 48 hours.   The patient had the opportunity to ask questions which were answered. Based upon this discussion, the patient is willing to proceed. Therefore, I've ordered: a CT Urogram and cystoscopy. The patient will return following all of the above for discussion of the results.  UA Urine culture  2. ? Abnormality of  posterior bladder wall  Mentioned in the RUS report from [facility] in 09/2018 Cystoscopy pending   3. LUTS Criteria for recurrent UTI has not been met with 2 or more infections in 6 months or 3 or greater infections in one year, but patient's records from her PCP's office are not available to me at this visit  Patient is instructed to increase their water intake until the urine is pale yellow or clear (10 to 12 cups daily)  Will request UA's and urine culture's from PCP's office.                                            Return for CT Urogram report and cystoscopy.  These notes generated with voice recognition software. I apologize for typographical errors.  Zara Council, PA-C  [facility] [address]  [place], [place] [address] [phone]

## 2019-01-31 ENCOUNTER — Ambulatory Visit (INDEPENDENT_AMBULATORY_CARE_PROVIDER_SITE_OTHER): Payer: Medicare Other | Admitting: Urology

## 2019-01-31 ENCOUNTER — Encounter: Payer: Self-pay | Admitting: Urology

## 2019-01-31 ENCOUNTER — Other Ambulatory Visit: Payer: Self-pay

## 2019-01-31 VITALS — BP 109/67 | HR 106 | Ht 62.0 in | Wt 115.0 lb

## 2019-01-31 DIAGNOSIS — R3129 Other microscopic hematuria: Secondary | ICD-10-CM

## 2019-01-31 DIAGNOSIS — N390 Urinary tract infection, site not specified: Secondary | ICD-10-CM

## 2019-02-01 LAB — MICROSCOPIC EXAMINATION: Bacteria, UA: NONE SEEN

## 2019-02-01 LAB — URINALYSIS, COMPLETE
Bilirubin, UA: NEGATIVE
Glucose, UA: NEGATIVE
Ketones, UA: NEGATIVE
Leukocytes,UA: NEGATIVE
Nitrite, UA: NEGATIVE
Protein,UA: NEGATIVE
Specific Gravity, UA: 1.015 (ref 1.005–1.030)
Urobilinogen, Ur: 0.2 mg/dL (ref 0.2–1.0)
pH, UA: 5 (ref 5.0–7.5)

## 2019-02-04 LAB — CULTURE, URINE COMPREHENSIVE

## 2019-02-05 ENCOUNTER — Telehealth: Payer: Self-pay | Admitting: *Deleted

## 2019-02-05 MED ORDER — SULFAMETHOXAZOLE-TRIMETHOPRIM 800-160 MG PO TABS: 1.0000 | ORAL_TABLET | Freq: Two times a day (BID) | ORAL | 0 refills | Status: DC

## 2019-02-05 NOTE — Telephone Encounter (Addendum)
Informed patient, verbalized understanding. Sent in RX to pharmacy as requested.  ----- Message from Nori Riis, PA-C sent at 02/05/2019  7:48 AM EDT ----- Please let Mrs. Beckstrom know that her urine culture was positive, but the amount of bacteria that grew out is not enough to cause an UTI.  I will however prescribe Septra DS, BID x 3 days as she is going to have a cystoscopy in the future.  What pharmacy would she like the Septra sent to?

## 2019-02-13 ENCOUNTER — Other Ambulatory Visit: Payer: Self-pay

## 2019-02-13 ENCOUNTER — Ambulatory Visit
Admission: RE | Admit: 2019-02-13 | Discharge: 2019-02-13 | Disposition: A | Discharge status: Home / Self Care | Payer: Medicare Other | Source: Ambulatory Visit | Attending: Urology | Admitting: Urology

## 2019-02-13 DIAGNOSIS — R3129 Other microscopic hematuria: Secondary | ICD-10-CM | POA: Insufficient documentation

## 2019-02-13 LAB — POCT I-STAT CREATININE: Creatinine, Ser: 1 mg/dL (ref 0.44–1.00)

## 2019-02-13 MED ORDER — IOHEXOL 300 MG/ML  SOLN
125.0000 mL | Freq: Once | INTRAMUSCULAR | Status: AC | PRN
  Administered 2019-02-13: 125 mL via INTRAVENOUS

## 2019-02-15 ENCOUNTER — Encounter: Payer: Self-pay | Admitting: Urology

## 2019-02-15 ENCOUNTER — Ambulatory Visit (INDEPENDENT_AMBULATORY_CARE_PROVIDER_SITE_OTHER): Payer: Medicare Other | Admitting: Urology

## 2019-02-15 ENCOUNTER — Other Ambulatory Visit: Payer: Self-pay

## 2019-02-15 VITALS — BP 108/65 | HR 79 | Ht 62.0 in | Wt 115.0 lb

## 2019-02-15 DIAGNOSIS — R3129 Other microscopic hematuria: Secondary | ICD-10-CM

## 2019-02-15 LAB — URINALYSIS, COMPLETE
Bilirubin, UA: NEGATIVE
Glucose, UA: NEGATIVE
Ketones, UA: NEGATIVE
Leukocytes,UA: NEGATIVE
Nitrite, UA: NEGATIVE
Protein,UA: NEGATIVE
Specific Gravity, UA: 1.025 (ref 1.005–1.030)
Urobilinogen, Ur: 0.2 mg/dL (ref 0.2–1.0)
pH, UA: 5 (ref 5.0–7.5)

## 2019-02-15 LAB — MICROSCOPIC EXAMINATION: Bacteria, UA: NONE SEEN

## 2019-02-15 MED ORDER — LIDOCAINE HCL URETHRAL/MUCOSAL 2 % EX GEL
1.0000 "application " | Freq: Once | CUTANEOUS | Status: AC
  Administered 2019-02-15: 1 via URETHRAL

## 2019-02-15 NOTE — Progress Notes (Signed)
   02/15/19  CC:  Chief Complaint  Patient presents with  . Cysto    HPI: Refer to [employer]'s note 01/31/2019.  CTU 02/13/2019 with probable small proteinaceous cysts left kidney.  Blood pressure 108/65, pulse 79, height 5\' 2"  (1.575 m), weight 115 lb (52.2 kg). NED. A&Ox3.   No respiratory distress   Abd soft, NT, ND Atrophic external genitalia with patent urethral meatus  Cystoscopy Procedure Note  Patient identification was confirmed, informed consent was obtained, and patient was prepped using Betadine solution.  Lidocaine jelly was administered per urethral meatus.    Procedure: - Flexible cystoscope introduced, without any difficulty.   - Thorough search of the bladder revealed:    normal urethral meatus    normal urothelium    no stones    no ulcers     no tumors    no urethral polyps    no trabeculation  - Ureteral orifices were normal in position and appearance.  Post-Procedure: - Patient tolerated the procedure well  Assessment/ Plan: - No significant abnormalities identified on cystoscopy.   - No abnormal bladder findings as suggested by prior renal ultrasound at [facility]. - No urologic abnormalities identified that would be responsible for her low back pain.  This is most likely musculoskeletal - Probable small hemorrhagic left renal cysts  - Follow-up 6 months with Larene Beach for recheck UA and consideration of follow-up renal ultrasound.  Abbie Sons, MD

## 2019-02-16 ENCOUNTER — Telehealth: Payer: Self-pay

## 2019-02-16 NOTE — Telephone Encounter (Signed)
Pt left message with after hours nurse to pass information along about Fosamax. Pt was informed at her appt on 02/15/2019 that Dr. Bernardo Heater is a urologist and does not manage this, she should call her PCP.

## 2019-02-17 ENCOUNTER — Encounter: Payer: Self-pay | Admitting: Urology

## 2019-05-01 ENCOUNTER — Other Ambulatory Visit
Admission: RE | Admit: 2019-05-01 | Discharge: 2019-05-01 | Disposition: A | Discharge status: Home / Self Care | Payer: Medicare Other | Attending: Orthopedic Surgery | Admitting: Orthopedic Surgery

## 2019-05-01 ENCOUNTER — Other Ambulatory Visit: Payer: Self-pay

## 2019-05-01 ENCOUNTER — Other Ambulatory Visit: Payer: Self-pay | Admitting: *Deleted

## 2019-05-01 DIAGNOSIS — M25551 Pain in right hip: Secondary | ICD-10-CM | POA: Insufficient documentation

## 2019-05-01 DIAGNOSIS — Z20822 Contact with and (suspected) exposure to covid-19: Secondary | ICD-10-CM

## 2019-05-01 LAB — SEDIMENTATION RATE: Sed Rate: 59 mm/hr — ABNORMAL HIGH (ref 0–30)

## 2019-05-01 LAB — C-REACTIVE PROTEIN: CRP: 3.1 mg/dL — ABNORMAL HIGH (ref <1.0)

## 2019-05-02 LAB — NOVEL CORONAVIRUS, NAA: SARS-CoV-2, NAA: NOT DETECTED

## 2019-05-04 LAB — MISC LABCORP TEST (SEND OUT)
Labcorp test code: 71506
Labcorp test code: 71522

## 2019-05-22 ENCOUNTER — Encounter: Payer: Self-pay | Admitting: Internal Medicine

## 2019-05-22 ENCOUNTER — Telehealth: Payer: Self-pay | Admitting: Surgical

## 2019-05-22 NOTE — Telephone Encounter (Signed)
Called patient to schedule appointment for breast and pelvic exam (physical) per referral from Dr. Rosario Jacks. Patient's mailbox is full. Will try to call her back.

## 2019-06-01 ENCOUNTER — Encounter: Payer: Self-pay | Admitting: *Deleted

## 2019-06-01 NOTE — Telephone Encounter (Signed)
Tried to call patient. Mail box is full

## 2019-06-09 ENCOUNTER — Emergency Department
Admission: EM | Admit: 2019-06-09 | Discharge: 2019-06-10 | Disposition: A | Discharge status: Home / Self Care | Payer: Medicare Other | Attending: Emergency Medicine | Admitting: Emergency Medicine

## 2019-06-09 ENCOUNTER — Emergency Department: Payer: Medicare Other

## 2019-06-09 ENCOUNTER — Encounter: Payer: Self-pay | Admitting: Emergency Medicine

## 2019-06-09 ENCOUNTER — Other Ambulatory Visit: Payer: Self-pay

## 2019-06-09 DIAGNOSIS — Z79899 Other long term (current) drug therapy: Secondary | ICD-10-CM | POA: Insufficient documentation

## 2019-06-09 DIAGNOSIS — R200 Anesthesia of skin: Secondary | ICD-10-CM | POA: Insufficient documentation

## 2019-06-09 DIAGNOSIS — E039 Hypothyroidism, unspecified: Secondary | ICD-10-CM | POA: Diagnosis not present

## 2019-06-09 DIAGNOSIS — J449 Chronic obstructive pulmonary disease, unspecified: Secondary | ICD-10-CM | POA: Insufficient documentation

## 2019-06-09 DIAGNOSIS — Z96642 Presence of left artificial hip joint: Secondary | ICD-10-CM | POA: Diagnosis not present

## 2019-06-09 DIAGNOSIS — M5412 Radiculopathy, cervical region: Secondary | ICD-10-CM

## 2019-06-09 DIAGNOSIS — G8929 Other chronic pain: Secondary | ICD-10-CM | POA: Insufficient documentation

## 2019-06-09 DIAGNOSIS — Z87891 Personal history of nicotine dependence: Secondary | ICD-10-CM | POA: Insufficient documentation

## 2019-06-09 DIAGNOSIS — Z955 Presence of coronary angioplasty implant and graft: Secondary | ICD-10-CM | POA: Diagnosis not present

## 2019-06-09 DIAGNOSIS — I10 Essential (primary) hypertension: Secondary | ICD-10-CM | POA: Insufficient documentation

## 2019-06-09 DIAGNOSIS — M542 Cervicalgia: Secondary | ICD-10-CM | POA: Diagnosis present

## 2019-06-09 LAB — CBC
HCT: 40.2 % (ref 36.0–46.0)
Hemoglobin: 13.4 g/dL (ref 12.0–15.0)
MCH: 29.5 pg (ref 26.0–34.0)
MCHC: 33.3 g/dL (ref 30.0–36.0)
MCV: 88.5 fL (ref 80.0–100.0)
Platelets: 262 10*3/uL (ref 150–400)
RBC: 4.54 MIL/uL (ref 3.87–5.11)
RDW: 11.7 % (ref 11.5–15.5)
WBC: 12.5 10*3/uL — ABNORMAL HIGH (ref 4.0–10.5)
nRBC: 0 % (ref 0.0–0.2)

## 2019-06-09 LAB — BASIC METABOLIC PANEL
Anion gap: 14 (ref 5–15)
BUN: 28 mg/dL — ABNORMAL HIGH (ref 8–23)
CO2: 22 mmol/L (ref 22–32)
Calcium: 9.3 mg/dL (ref 8.9–10.3)
Chloride: 101 mmol/L (ref 98–111)
Creatinine, Ser: 0.68 mg/dL (ref 0.44–1.00)
GFR calc Af Amer: >60 mL/min (ref >60)
GFR calc non Af Amer: >60 mL/min (ref >60)
Glucose, Bld: 131 mg/dL — ABNORMAL HIGH (ref 70–99)
Potassium: 4.3 mmol/L (ref 3.5–5.1)
Sodium: 137 mmol/L (ref 135–145)

## 2019-06-09 MED ORDER — LORAZEPAM 0.5 MG PO TABS
0.5000 mg | ORAL_TABLET | Freq: Once | ORAL | Status: AC
  Administered 2019-06-09: 0.5 mg via ORAL
  Filled 2019-06-09: qty 1

## 2019-06-09 MED ORDER — ORPHENADRINE CITRATE 30 MG/ML IJ SOLN
60.0000 mg | Freq: Two times a day (BID) | INTRAMUSCULAR | Status: DC
  Administered 2019-06-09: 60 mg via INTRAMUSCULAR
  Filled 2019-06-09: qty 2

## 2019-06-09 MED ORDER — PREDNISONE 20 MG PO TABS
60.0000 mg | ORAL_TABLET | Freq: Once | ORAL | Status: AC
  Administered 2019-06-09: 60 mg via ORAL
  Filled 2019-06-09: qty 3

## 2019-06-09 MED ORDER — GABAPENTIN 100 MG PO CAPS
100.0000 mg | ORAL_CAPSULE | Freq: Once | ORAL | Status: AC
  Administered 2019-06-09: 100 mg via ORAL
  Filled 2019-06-09: qty 1

## 2019-06-09 MED ORDER — BACLOFEN 10 MG PO TABS
10.0000 mg | ORAL_TABLET | Freq: Once | ORAL | Status: AC
  Administered 2019-06-09: 10 mg via ORAL
  Filled 2019-06-09: qty 1

## 2019-06-09 MED ORDER — LIDOCAINE 5 % EX PTCH
1.0000 | MEDICATED_PATCH | CUTANEOUS | Status: DC
  Administered 2019-06-09: 1 via TRANSDERMAL
  Filled 2019-06-09: qty 1

## 2019-06-09 NOTE — ED Triage Notes (Signed)
Neck and back pain x 2 days. Denies fall or injury.

## 2019-06-09 NOTE — ED Triage Notes (Signed)
FIRST NURSE NOTE:  Pt arrived via EMS from home with reports of back pain, pt has hx of chronic back pain.

## 2019-06-09 NOTE — ED Notes (Signed)
Pt reminded to call if she needs to get OOB due to muscle relaxant given. Pt given call bell.

## 2019-06-09 NOTE — ED Notes (Signed)
Pt sleeping, no apparent distress. RR 18

## 2019-06-09 NOTE — ED Provider Notes (Signed)
-----------------------------------------   11:05 PM on 06/09/2019 -----------------------------------------  Blood pressure 137/72, pulse 87, temperature 98.4 F (36.9 C), temperature source Oral, resp. rate 20, height 5\' 2"  (1.575 m), weight 51.3 kg, SpO2 95 %.  Assuming care from [facility].  In short, Diane Adkins is a 73 y.o. female with a chief complaint of Back Pain .  Patient has a chronic history of back pain.  Patient received prednisone, gabapentin, Norflex, baclofen from previous provider.  Patient still appears extremely on comfortable and is tearful.  She has very limited range of motion of her neck currently.  She was then given a small dose of Ativan to help her relax.  MRI was ordered to further evaluate.  Patient was unable to lie flat for MRI.  Case was discussed with Dr. Owens Shark who will take over care while obtaining MRI and disposition.  Refer to the original H&P for additional details.   Laban Emperor, PA-C 06/09/19 [id number]    Gregor Hams, MD 06/10/19 [phone]

## 2019-06-09 NOTE — ED Notes (Signed)
Patient states she did not have a legal guardian

## 2019-06-09 NOTE — ED Provider Notes (Signed)
[facility] - Inpatient Emergency Department Provider Note ____________________________________________  Time seen: 1900  I have reviewed the triage vital signs and the nursing notes.  HISTORY  Chief Complaint  Back Pain   HPI Diane Adkins is a 73 y.o. female presents to the clinic today with c/o acute on chronic neck pain. She has been having neck pain for 40 years. She describes the pain as sharp, radiating down her bilateral arms. She reports associated numbness of the upper extremities but denies tingling or weakness. She has a history of cervical radiculopathy and fibromyalgia.Xray cerviial spine was 01/2017. She reports history of 5 anterior fusions in the past. She follows with Dr. Cristy Folks, last saw him a few days ago. She asked that he increase her Oxycodone but he declined. She took a dose of Oxycodone around 2 pm with minimal relief.   Past Medical History:  Diagnosis Date  . Arthritis    ra  . Cardiomegaly   . Cataract   . COPD (chronic obstructive pulmonary disease) ([place])   . Coronary artery disease   . Depression   . Diverticulitis   . Epilepsy ([place])   . Fibromyalgia   . GERD (gastroesophageal reflux disease)   . HOH (hard of hearing)    aids  . Hypertension   . Hypothyroidism   . Inflammatory bowel disease   . Lupus ([place])   . 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 MRSA (methicillin resistant staph aureus) culture positive 1990's  . Opioid dependence ([place])   . Pain    chronic back and neck  . Seizures ([place])    last 2016  . Spinal disorder    stenosis  . Thyroid disease    hypothyroid    Patient Active Problem List   Diagnosis Date Noted  . Lumbar facet syndrome (Bilateral) (R>L) 03/03/2017  . DDD (degenerative disc disease), lumbar 03/03/2017  . Chronic pain syndrome 01/25/2017  . Chronic thoracic spine pain 01/25/2017  . Numbness of lower extremity (B) 01/25/2017  . Long term current use of anticoagulant therapy 01/25/2017  . History of  Helicobacter pylori infection 01/24/2017  . Shoulder pain 10/19/2016  . COPD (chronic obstructive pulmonary disease) ([place]) 02/17/2016  . Epilepsy ([place]) 02/17/2016  . H/O adenomatous polyp of colon 02/17/2016  . H/O infectious disease 02/17/2016  . Personal history of other infectious and parasitic diseases 02/17/2016  . HLD (hyperlipidemia) 02/17/2016  . Hypothyroidism 02/17/2016  . Degeneration macular 02/17/2016  . Arthritis, degenerative 02/17/2016  . Presence of stent in coronary artery 02/17/2016  . Long term prescription benzodiazepine use 02/17/2016  . Long term current use of opiate analgesic 02/17/2016  . Long term prescription opiate use 02/17/2016  . Opiate use 02/17/2016  . Encounter for therapeutic drug level monitoring 02/17/2016  . Encounter for pain management planning 02/17/2016  . Chronic neck pain (Secondary source of pain) (Bilateral) (R>L) 02/17/2016  . Chronic low back pain (Primary Source of Pain) (Bilateral) (R>L) 02/17/2016  . Chronic upper back pain [facility] source of pain) (Bilateral) (R>L) 02/17/2016  . Chronic Arm numbness (Bilateral) (R>L) 02/17/2016  . Bilateral numbness and tingling of arms and legs (R>L) 02/17/2016  . 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 History of left hip replacement (Depue recalled replacement) 02/17/2016  . MRSA (methicillin resistant Staphylococcus aureus) 02/17/2016    Class: History of  . Lumbar spondylosis 02/17/2016  . Cervical spondylosis 02/17/2016  . Altered mental status 12/16/2015  . Dysphagia 01/16/2014  . Carpal tunnel syndrome (S/P Right  side release) (Bilateral) (B) (R>L) 11/15/2013  . Arteriosclerosis of coronary artery 10/17/2013  . Acid reflux 10/17/2013  . BP (high blood pressure) 10/17/2013  . Seizure ([place]) 05/26/2009    Past Surgical History:  Procedure Laterality Date  . ABDOMINAL HYSTERECTOMY    . BACK SURGERY     [age]   cervical fusion/thoracic/lumbar with ray cages  . BREAST SURGERY      implants  . cardiac stents  2013   x 2  . CATARACT EXTRACTION W/PHACO Left 03/30/2016   Procedure: CATARACT EXTRACTION PHACO AND INTRAOCULAR LENS PLACEMENT (IOC);  Surgeon: Birder Robson, MD;  Location: [facility] ORS;  Service: Ophthalmology;  Laterality: Left;  [place] 00:45 AP% 19.6 CDE 8.93 Fluid pack lot # [id number]  . CATARACT EXTRACTION W/PHACO Right 04/27/2016   Procedure: CATARACT EXTRACTION PHACO AND INTRAOCULAR LENS PLACEMENT (IOC);  Surgeon: Birder Robson, MD;  Location: [facility] ORS;  Service: Ophthalmology;  Laterality: Right;  Lot [id number] H [place]: 00:39.1 AP%: 17.3 CDE: 6.76  . CERVICAL SPINE SURGERY    . CORONARY ANGIOPLASTY     stent x2  . EYE SURGERY    . JOINT REPLACEMENT Left 2007   hip  . rotator cuff surg Right     Prior to Admission medications   Medication Sig Start Date End Date Taking? Authorizing Provider  calcium carbonate 1250 MG capsule Take 1,250 mg by mouth 2 (two) times daily with a meal.    [provider]  diphenhydrAMINE (BENADRYL) 25 MG tablet Take 25 mg by mouth every 6 (six) hours as needed.    [provider]  ketoconazole (NIZORAL) 2 % shampoo See admin instructions. 02/05/19   [provider]  levETIRAcetam (KEPPRA) 1000 MG tablet Take 500 mg by mouth 2 (two) times daily.     [provider]  levETIRAcetam (KEPPRA) 500 MG tablet TAKE 1 TABLET 2 TIMES A DAY ORALLY 02/03/19   [provider]  levothyroxine (SYNTHROID, LEVOTHROID) 100 MCG tablet Take 88 mcg by mouth daily before breakfast.     [provider]  Loratadine 10 MG CAPS Take by mouth.    [provider]  metoprolol tartrate (LOPRESSOR) 25 MG tablet Take 25 mg by mouth 2 (two) times daily.    [provider]  Multiple Vitamins-Minerals (ICAPS AREDS 2 PO) Take 1 tablet by mouth 2 (two) times daily.    [provider]  omeprazole (PRILOSEC) 20 MG capsule Take by mouth.    [provider]  PROAIR HFA 108 (90  Base) MCG/ACT inhaler 2 puffs every 4 (four) hours as needed.  04/15/16   [provider]  SYNTHROID 88 MCG tablet  02/08/19   [provider]  Vitamin D, Ergocalciferol, (DRISDOL) 50000 units CAPS capsule 50,000 Units every 7 (seven) days.  05/11/16   [provider]    Allergies Keflex [cephalexin], Lamotrigine, Macrodantin [nitrofurantoin macrocrystal], Nitrofurantoin, Statins, and Fosamax [alendronate sodium]  Family History  Problem Relation Age of Onset  . Heart disease Mother   . Cancer Father     Social History Social History   Tobacco Use  . Smoking status: Former Smoker    Packs/day: 0.50    Quit date: 02/10/2016    Years since quitting: 3.3  . Smokeless tobacco: Never Used  Substance Use Topics  . Alcohol use: No  . Drug use: No    Review of Systems  Constitutional: Negative for fever, chills or body aches. Cardiovascular: Negative for chest pain or chest tightness. Respiratory: Negative for  cough or shortness of breath. Musculoskeletal: Positive for neck pain. Skin: Negative for rash. Neurological: Positive for numbness of BUE. Negative for headaches, focal weakness or tingling. ____________________________________________  PHYSICAL EXAM:  VITAL SIGNS: ED Triage Vitals [06/09/19 1753]  Enc Vitals Group     BP (!) 156/77     Pulse Rate 95     Resp 18     Temp 99.1 F (37.3 C)     Temp Source Oral     SpO2 95 %     Weight 113 lb (51.3 kg)     Height 5\' 2"  (1.575 m)     Head Circumference      Peak Flow      Pain Score 10     Pain Loc      Pain Edu?      Excl. in [place]?     Constitutional: Alert and oriented. Appears in pain. Head: Normocephalic and atraumatic. Eyes: Conjunctivae are normal. PERRL. Normal extraocular movements Cardiovascular: Normal rate, regular rhythm. Radial pulses 2+ bilaterally. Respiratory: Normal respiratory effort. No wheezes/rales/rhonchi. Gastrointestinal: Soft and nontender. No  distention. Musculoskeletal: She refuses all ROM secondary to pain. Bony tenderness noted over the cervical spine. Hand grips equal. Strength 3/5 BUE. Neurologic:  Normal speech and language. HOH Skin:  Skin is warm, dry and intact. No rash noted. ____________________________________________    ____________________________________________  INITIAL IMPRESSION / ASSESSMENT AND PLAN / ED COURSE  Cervical Radiculitis, Acute on Chronic:  Prednisone 60 mg PO x 1 Norflex 60 mg IM x 1 Gabapentin 100 mg PO x 1 Heat applied to neck Pain improved some but still severe after 1 hour Baclofen 10 mg PO x 1 Handoff given to Laban Emperor PA-C at 2100  I reviewed the patient's prescription history over the last 12 months in the multi-state controlled substances database(s) that includes [place], [place], [place], [place], [place], [place], [place], [place], [place], [place], [place], [place], [place], and [place].  Results were notable for Oxycodone #120 monthly from Dr. Cristy Folks ____________________________________________  FINAL CLINICAL IMPRESSION(S) / ED DIAGNOSES  Final diagnoses:  None      Lilia Pro., MD 06/11/19 1025

## 2019-06-09 NOTE — ED Notes (Signed)
Warm blankets placed around pt's neck and shoulders for comfort. Pt verbally reassured.

## 2019-06-09 NOTE — ED Notes (Signed)
Pt states she does not have legal guardian.

## 2019-06-09 NOTE — ED Notes (Signed)
Pt given new warm blankets to shoulders and neck for help with back pain.

## 2019-06-09 NOTE — ED Notes (Signed)
This RN called Dominica Severin, listed as pt's POA. Dominica Severin states he knows pt but is not her legal guardian.  Dominica Severin states he is not sure he is pt's POA as he thought pt had changed it.

## 2019-06-09 NOTE — ED Notes (Addendum)
Pt c/o back and upper shoulder pain, pt hunched over and stating she cannot relax back in bed because of the pain. Pt states she has had pain for several days and it isn't getting better. Pt moaning when no one is in room, pt given pillows for comfort and support.

## 2019-06-09 NOTE — ED Notes (Signed)
Pt on phone with MRI, phone on speaker and pt states she is able to hear well.

## 2019-06-10 NOTE — ED Notes (Signed)
Pt assisted to bedside bathroom with minimal assistance.  Dr. Owens Shark at bedside to update patient and inform of discharge.  Will attempt to call pt's ride.

## 2019-06-10 NOTE — ED Notes (Signed)
Pt sleeping during room transfer. Pt remains asleep. VSS

## 2019-06-10 NOTE — ED Notes (Signed)
This RN attempted to call pt's friend for discharge.

## 2019-06-10 NOTE — ED Notes (Signed)
Pt sleeping at present

## 2019-06-10 NOTE — ED Notes (Signed)
Pt contacted notified and verified he will pick pt up upon discharge.

## 2019-06-10 NOTE — ED Notes (Signed)
Pt sleeping. VSS. This RN spoke with Diane Adkins, pt's friend waiting to pick up pt. Per Dr. Owens Shark, Mr. Theda Sers informed that pt will be discharged later in the morning.

## 2019-06-10 NOTE — ED Notes (Signed)
Report to steven, rn.

## 2019-06-10 NOTE — ED Notes (Signed)
ED Provider at bedside. 

## 2019-06-10 NOTE — ED Notes (Signed)
Pt found with oxycodone pill bottle in pocket with pills in it. Pills will be walked to pharmacy. Pt asked if she had anymore pills with belongings, pt stated she did not but also refused for this RN to check belongings. Pt instructed that it is dangerous to take pills without MD order while in hospital.

## 2019-06-10 NOTE — ED Notes (Addendum)
Pt to MRI from 11/28 2350 to 11/29 0020. This RN present for procedure. Pt tolerated well, appeared to sleep through most of procedure. Pt returned to room, sleeping at present.

## 2019-06-14 NOTE — Telephone Encounter (Signed)
Mailbox full unable to leave message.

## 2019-06-26 NOTE — Telephone Encounter (Signed)
Mailbox full . Unable to leave message

## 2019-07-03 NOTE — Telephone Encounter (Signed)
Patient never returned call  

## 2019-08-02 NOTE — Telephone Encounter (Signed)
Spoke with Crystal at Dr. Rosario Jacks office. I let her know that patient has not returned call to schedule. I told her that if the patient comes in to feel free to call the office to schedule.

## 2019-08-23 ENCOUNTER — Encounter: Payer: Self-pay | Admitting: Urology

## 2019-08-23 ENCOUNTER — Ambulatory Visit: Payer: Medicare Other | Admitting: Urology

## 2019-10-16 ENCOUNTER — Other Ambulatory Visit: Payer: Self-pay | Admitting: Orthopedic Surgery

## 2019-10-16 DIAGNOSIS — Z96642 Presence of left artificial hip joint: Secondary | ICD-10-CM

## 2019-10-29 ENCOUNTER — Ambulatory Visit: Payer: Medicare Other

## 2019-11-06 ENCOUNTER — Ambulatory Visit: Payer: Medicare Other

## 2019-11-07 ENCOUNTER — Ambulatory Visit: Payer: Medicare Other

## 2019-11-17 ENCOUNTER — Ambulatory Visit
Admission: RE | Admit: 2019-11-17 | Discharge: 2019-11-17 | Disposition: A | Discharge status: Home / Self Care | Payer: Medicare Other | Source: Ambulatory Visit | Attending: Orthopedic Surgery | Admitting: Orthopedic Surgery

## 2019-11-17 DIAGNOSIS — Z96642 Presence of left artificial hip joint: Secondary | ICD-10-CM | POA: Diagnosis not present

## 2019-12-06 ENCOUNTER — Inpatient Hospital Stay: Payer: Medicare Other | Admitting: Oncology

## 2019-12-06 ENCOUNTER — Telehealth: Payer: Self-pay | Admitting: *Deleted

## 2019-12-06 ENCOUNTER — Inpatient Hospital Stay: Payer: Medicare Other

## 2019-12-06 NOTE — Telephone Encounter (Signed)
Pt called and left message to cancel the appt. Colette tried to call her back and she only has cell phone and the voicemail is full; can't leave message. I tried calling this afternoon and got voicemail that is full again. I called the friend on list but did not leave a message and then he called ma back . He says that pt.is a lot of time cancelling the appts, he tells her that she needs to see md to get to feel better. Sometimes she gets mad at him and fusses at him. He said he is just a friend trying to help her. He will try calling her but no promises to get her rescheduled.

## 2019-12-09 IMAGING — MR MR CERVICAL SPINE W/O CM
5 series · 39 of 48 positions shown · non-contrast
Comparison: Cervical spine radiograph 01/25/2017

CLINICAL DATA: Neck pain

EXAM:
MRI CERVICAL SPINE WITHOUT CONTRAST
TECHNIQUE: Multiplanar, multisequence MR imaging of the cervical spine was
performed. No intravenous contrast was administered.

[Series 5: T2 · sagittal · 3.0mm · 0.62mm/px · 8 of 17 slices shown (1 of 2)]
[im 1/17]
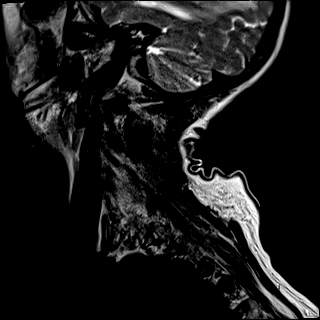
[im 3/17]
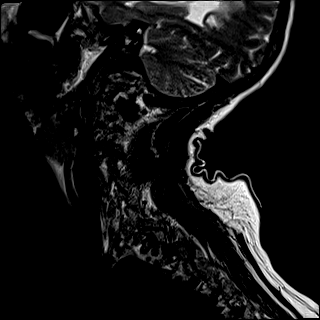
[im 5/17]
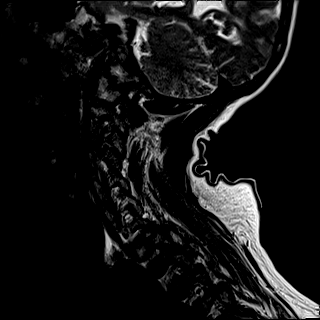
[im 7/17]
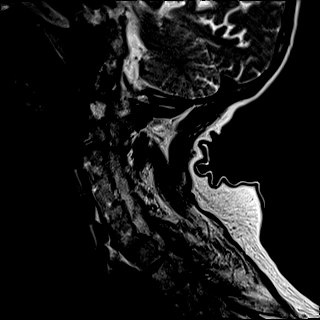
[im 10/17]
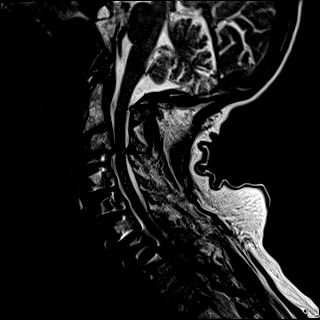
[im 12/17]
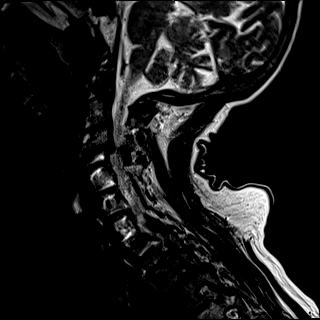
[im 14/17]
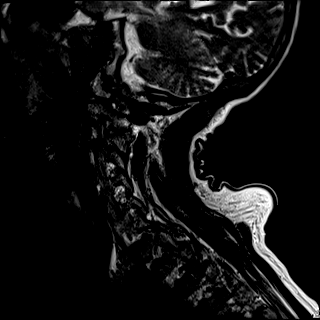
[im 17/17]
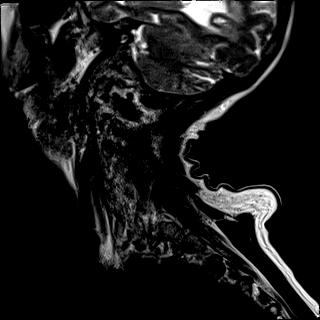

[Series 6: FLAIR · sagittal · 3.0mm · 0.78mm/px · 7 of 17 slices shown]
[im 1/17]
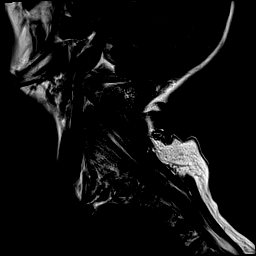
[im 3/17]
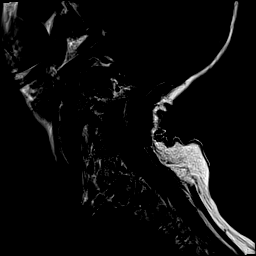
[im 6/17]
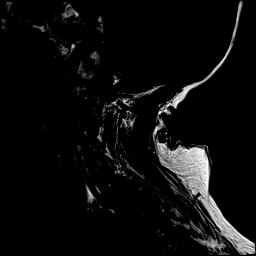
[im 9/17]
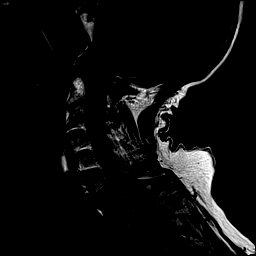
[im 11/17]
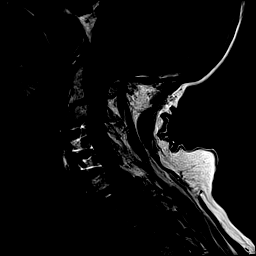
[im 14/17]
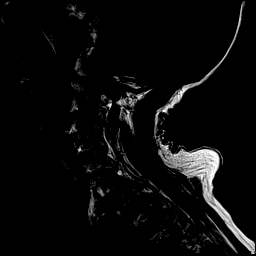
[im 17/17]
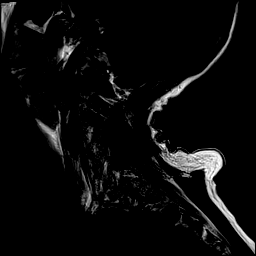

[Series 7: STIR · sagittal · 3.0mm · 0.62mm/px · 7 of 17 slices shown]
[im 1/17]
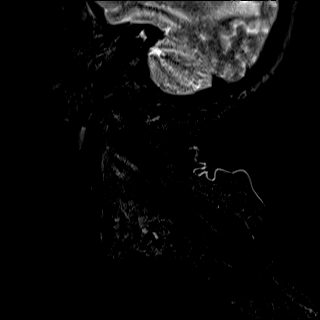
[im 3/17]
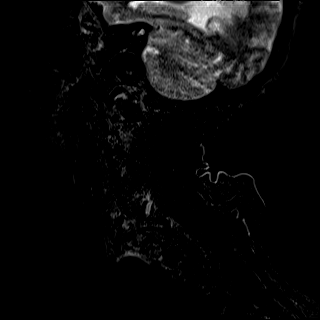
[im 6/17]
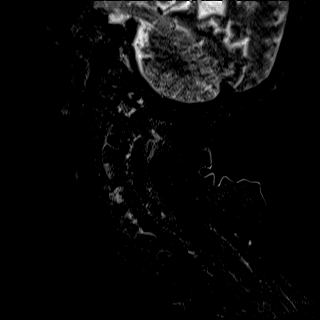
[im 9/17]
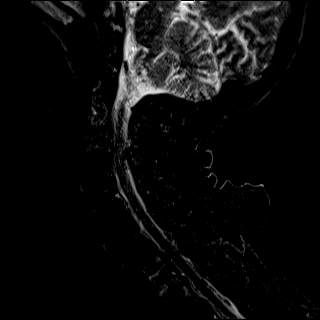
[im 11/17]
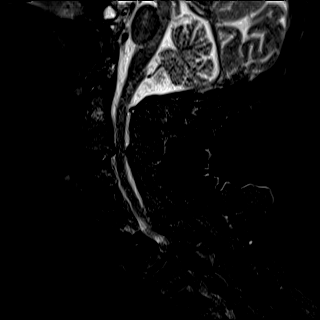
[im 14/17]
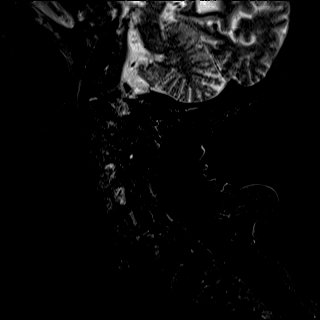
[im 17/17]
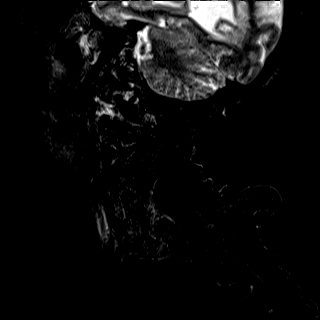

[Series 8: T2 · axial · 3.0mm · 0.70mm/px · z∈[-137,-41]mm · 9 of 29 slices shown (2 of 2)]
[im 1/29]
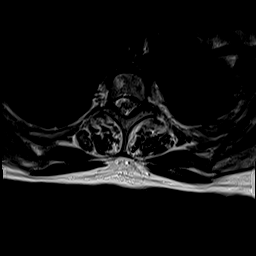
[im 5/29]
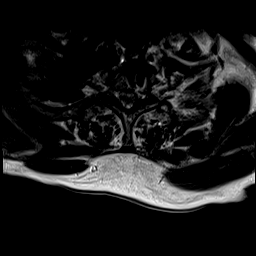
[im 10/29]
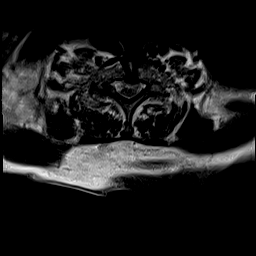
[im 12/29]
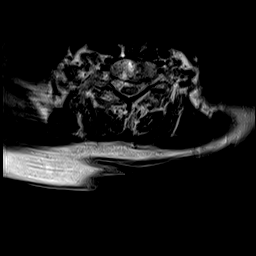
[im 15/29]
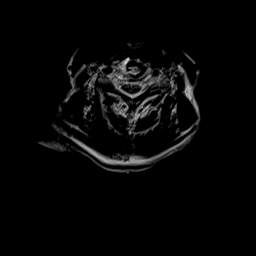
[im 17/29]
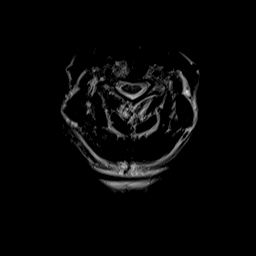
[im 19/29]
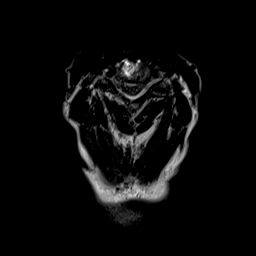
[im 24/29]
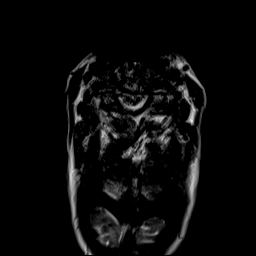
[im 29/29]
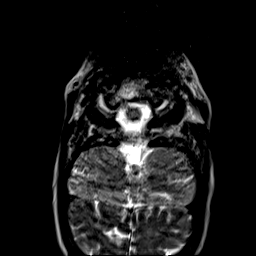

[Series 9: ax mpgr · axial · 3.0mm · 0.35mm/px · z∈[-137,-41]mm · 8 of 31 slices shown]
[im 1/31]
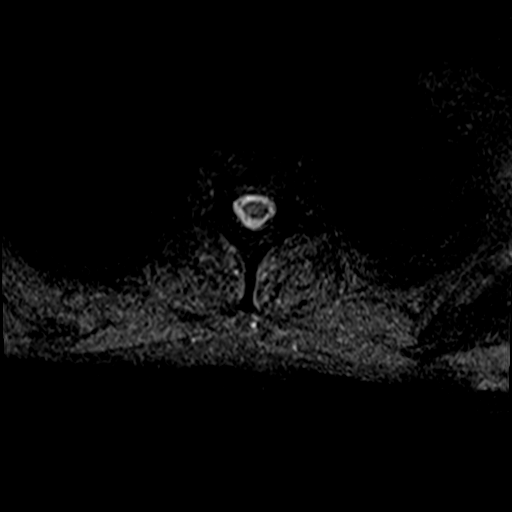
[im 6/31]
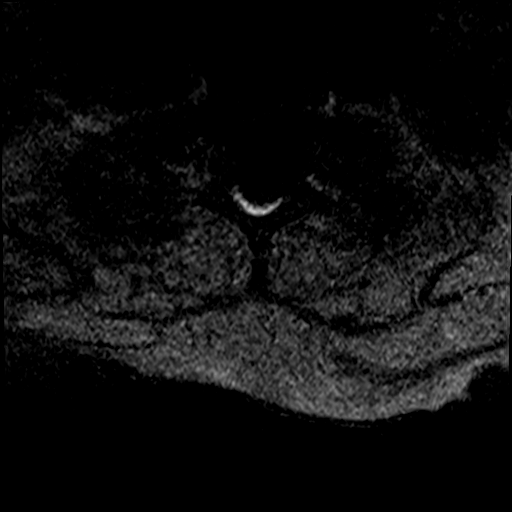
[im 11/31]
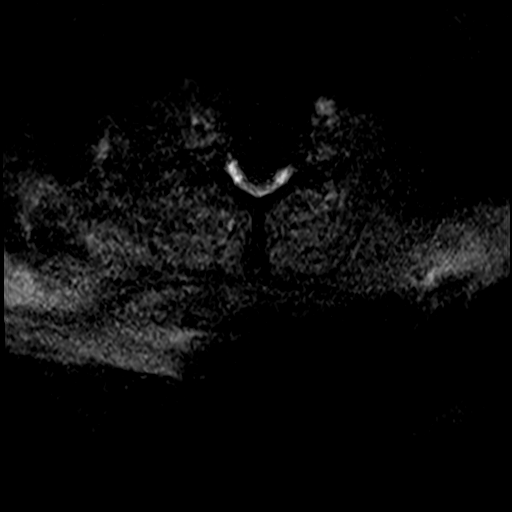
[im 13/31]
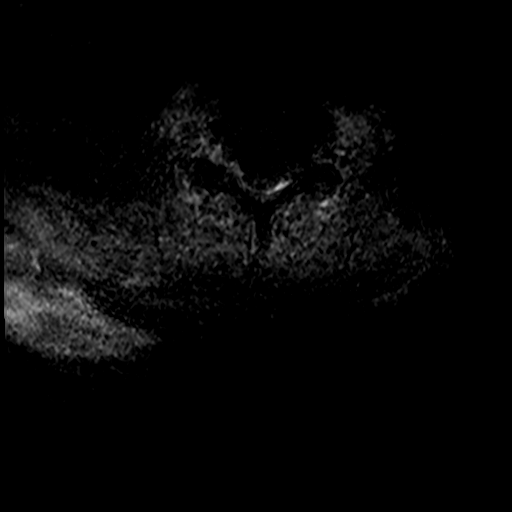
[im 18/31]
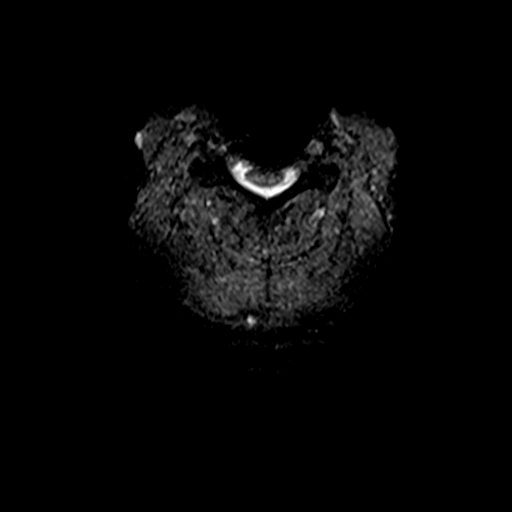
[im 21/31]
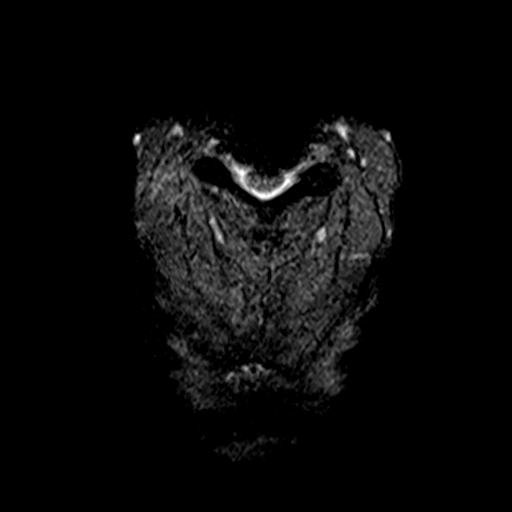
[im 26/31]
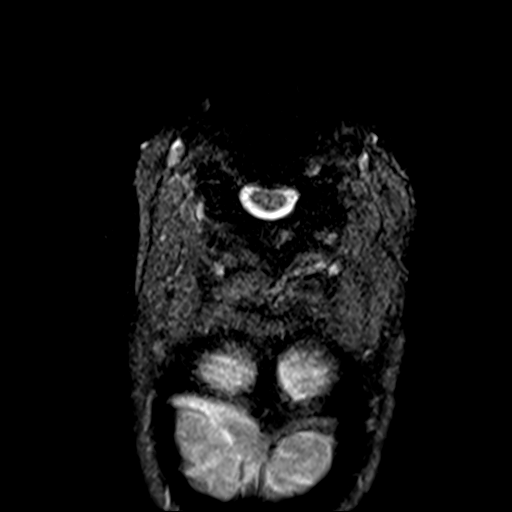
[im 31/31]
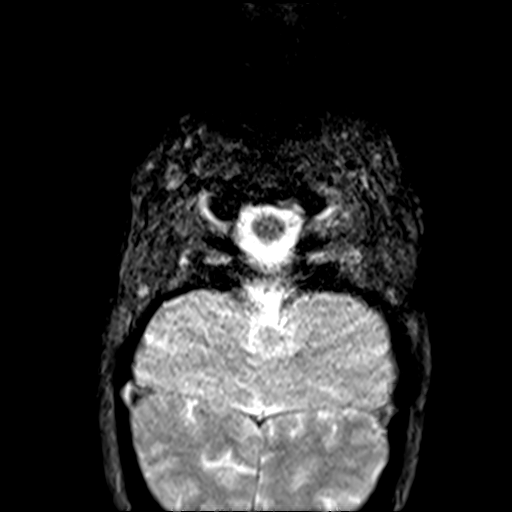

[39 of 48 positions shown; findings below may reference images not displayed]

FINDINGS: Alignment: Grade 1 retrolisthesis at C3-4

Vertebrae: C4-6 and C7-T1 ACDF

Cord: Normal signal

Posterior Fossa, vertebral arteries, paraspinal tissues: Negative.

Disc levels:

C1-C2: Normal.

C2-C3: Disc space narrowing without spinal canal or neural foraminal
stenosis.

C3-C4: Disc space narrowing with uncovertebral and facet
hypertrophy. Moderate spinal canal stenosis and moderate bilateral
foraminal stenosis.

C4-C5: ACDF. No spinal canal or neural foraminal stenosis.

C5-C6: ACDF. No spinal canal or neural foraminal stenosis.

C6-C7: ACDF. No spinal canal or neural foraminal stenosis.

C7-T1: Mild left uncovertebral hypertrophy. ACDF. Mild left
foraminal narrowing. No spinal canal stenosis.

T1-2: Small disc bulge with mild bilateral foraminal stenosis.

T2-3: Sagittal imaging only. Small disc bulge with moderate right
and mild left foraminal stenosis.

T3-4: Sagittal imaging only. No spinal canal or neural foraminal
stenosis.
IMPRESSION: Motion degraded study.

1. C4-C6 and C7-T1 ACDF with widely patent spinal canal at these
levels.
2. Adjacent segment disease at C3-4 with moderate spinal canal and
bilateral foraminal stenosis.

## 2019-12-13 ENCOUNTER — Inpatient Hospital Stay: Payer: Medicare Other

## 2019-12-13 ENCOUNTER — Inpatient Hospital Stay: Payer: Medicare Other | Attending: Oncology | Admitting: Oncology

## 2019-12-13 ENCOUNTER — Other Ambulatory Visit: Payer: Self-pay

## 2019-12-13 ENCOUNTER — Encounter: Payer: Self-pay | Admitting: Oncology

## 2019-12-13 VITALS — BP 111/63 | HR 81 | Temp 96.5°F | Resp 18 | Wt 103.9 lb

## 2019-12-13 DIAGNOSIS — Z96642 Presence of left artificial hip joint: Secondary | ICD-10-CM | POA: Insufficient documentation

## 2019-12-13 DIAGNOSIS — R7989 Other specified abnormal findings of blood chemistry: Secondary | ICD-10-CM | POA: Diagnosis not present

## 2019-12-13 DIAGNOSIS — R5383 Other fatigue: Secondary | ICD-10-CM | POA: Insufficient documentation

## 2019-12-13 DIAGNOSIS — Z809 Family history of malignant neoplasm, unspecified: Secondary | ICD-10-CM | POA: Insufficient documentation

## 2019-12-13 DIAGNOSIS — Z87891 Personal history of nicotine dependence: Secondary | ICD-10-CM | POA: Insufficient documentation

## 2019-12-13 DIAGNOSIS — Z90710 Acquired absence of both cervix and uterus: Secondary | ICD-10-CM | POA: Insufficient documentation

## 2019-12-13 DIAGNOSIS — R748 Abnormal levels of other serum enzymes: Secondary | ICD-10-CM

## 2019-12-13 DIAGNOSIS — G40909 Epilepsy, unspecified, not intractable, without status epilepticus: Secondary | ICD-10-CM | POA: Insufficient documentation

## 2019-12-13 DIAGNOSIS — R109 Unspecified abdominal pain: Secondary | ICD-10-CM

## 2019-12-13 DIAGNOSIS — E039 Hypothyroidism, unspecified: Secondary | ICD-10-CM | POA: Insufficient documentation

## 2019-12-13 LAB — CBC WITH DIFFERENTIAL/PLATELET
Abs Immature Granulocytes: 0.02 10*3/uL (ref 0.00–0.07)
Basophils Absolute: 0.1 10*3/uL (ref 0.0–0.1)
Basophils Relative: 1 %
Eosinophils Absolute: 0.2 10*3/uL (ref 0.0–0.5)
Eosinophils Relative: 2 %
HCT: 41 % (ref 36.0–46.0)
Hemoglobin: 13.8 g/dL (ref 12.0–15.0)
Immature Granulocytes: 0 %
Lymphocytes Relative: 25 %
Lymphs Abs: 2 10*3/uL (ref 0.7–4.0)
MCH: 29.9 pg (ref 26.0–34.0)
MCHC: 33.7 g/dL (ref 30.0–36.0)
MCV: 88.9 fL (ref 80.0–100.0)
Monocytes Absolute: 0.8 10*3/uL (ref 0.1–1.0)
Monocytes Relative: 10 %
Neutro Abs: 5.2 10*3/uL (ref 1.7–7.7)
Neutrophils Relative %: 62 %
Platelets: 292 10*3/uL (ref 150–400)
RBC: 4.61 MIL/uL (ref 3.87–5.11)
RDW: 13.2 % (ref 11.5–15.5)
WBC: 8.3 10*3/uL (ref 4.0–10.5)
nRBC: 0 % (ref 0.0–0.2)

## 2019-12-13 LAB — URINALYSIS, COMPLETE (UACMP) WITH MICROSCOPIC
Bacteria, UA: NONE SEEN
Bilirubin Urine: NEGATIVE
Glucose, UA: NEGATIVE mg/dL
Hgb urine dipstick: NEGATIVE
Ketones, ur: NEGATIVE mg/dL
Nitrite: NEGATIVE
Protein, ur: NEGATIVE mg/dL
Specific Gravity, Urine: 1.024 (ref 1.005–1.030)
pH: 5 (ref 5.0–8.0)

## 2019-12-13 LAB — COMPREHENSIVE METABOLIC PANEL
ALT: 12 U/L (ref 0–44)
AST: 17 U/L (ref 15–41)
Albumin: 4.2 g/dL (ref 3.5–5.0)
Alkaline Phosphatase: 77 U/L (ref 38–126)
Anion gap: 8 (ref 5–15)
BUN: 35 mg/dL — ABNORMAL HIGH (ref 8–23)
CO2: 30 mmol/L (ref 22–32)
Calcium: 9.6 mg/dL (ref 8.9–10.3)
Chloride: 103 mmol/L (ref 98–111)
Creatinine, Ser: 0.76 mg/dL (ref 0.44–1.00)
GFR calc Af Amer: >60 mL/min (ref >60)
GFR calc non Af Amer: >60 mL/min (ref >60)
Glucose, Bld: 80 mg/dL (ref 70–99)
Potassium: 4.4 mmol/L (ref 3.5–5.1)
Sodium: 141 mmol/L (ref 135–145)
Total Bilirubin: 0.6 mg/dL (ref 0.3–1.2)
Total Protein: 7.8 g/dL (ref 6.5–8.1)

## 2019-12-13 LAB — SEDIMENTATION RATE: Sed Rate: 26 mm/hr (ref 0–30)

## 2019-12-13 NOTE — Progress Notes (Signed)
Hematology/Oncology Consult note [facility] Telephone:([phone] [phone]  Patient Care Team: Casilda Carls, MD as PCP - General (Internal Medicine)   Name of the patient: Diane Adkins  [id number]  1946/04/12    Reason for referral- elevated b12 level  Referring physician- Dr. Rosario Jacks  Date of visit: 12/13/19   History of presenting illness- Patient is a 74 year old female with a past medical history significant for seizure disorder, hypothyroidism who has been referred to [place] for elevated B12 level.  Her most recent CBC from 11/13/2019 showed white count of 7.4, H&H of 14.8/43 with an MCV of 87 and a platelet count of 267.  Differential was normal.  TSH normal.  Vitamin D levels normal.B12 levels were found to be greater than 2000. She is not taking po b12.   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 She reports ongoing fatigue. She reports chronic back pain and joint pain  ECOG PS- 2  Pain scale- 6   Review of systems- Review of Systems  Constitutional: Positive for malaise/fatigue. Negative for chills, fever and weight loss.  HENT: Negative for congestion, ear discharge and nosebleeds.   Eyes: Negative for blurred vision.  Respiratory: Negative for cough, hemoptysis, sputum production, shortness of breath and wheezing.   Cardiovascular: Negative for chest pain, palpitations, orthopnea and claudication.  Gastrointestinal: Negative for abdominal pain, blood in stool, constipation, diarrhea, heartburn, melena, nausea and vomiting.  Genitourinary: Negative for dysuria, flank pain, frequency, hematuria and urgency.  Musculoskeletal: Positive for back pain and joint pain. Negative for myalgias.  Skin: Negative for rash.  Neurological: Negative for dizziness, tingling, focal weakness, seizures, weakness and headaches.  Endo/Heme/Allergies: Does not bruise/bleed easily.  Psychiatric/Behavioral:  Negative for depression and suicidal ideas. The patient does not have insomnia.     Allergies  Allergen Reactions  . Keflex [Cephalexin] Other (See Comments) and Hives    Reaction: unknown  . Lamotrigine Itching and Swelling  . Macrodantin [Nitrofurantoin Macrocrystal] Other (See Comments)    Reaction: unknown  . Nitrofurantoin Hives  . Statins Hives  . Fosamax [Alendronate Sodium] Hives    Patient Active Problem List   Diagnosis Date Noted  . Lumbar facet syndrome (Bilateral) (R>L) 03/03/2017  . DDD (degenerative disc disease), lumbar 03/03/2017  . Chronic pain syndrome 01/25/2017  . Chronic thoracic spine pain 01/25/2017  . Numbness of lower extremity (B) 01/25/2017  . Long term current use of anticoagulant therapy 01/25/2017  . History of Helicobacter pylori infection 01/24/2017  . Shoulder pain 10/19/2016  . COPD (chronic obstructive pulmonary disease) ([place]) 02/17/2016  . Epilepsy ([place]) 02/17/2016  . H/O adenomatous polyp of colon 02/17/2016  . H/O infectious disease 02/17/2016  . Personal history of other infectious and parasitic diseases 02/17/2016  . HLD (hyperlipidemia) 02/17/2016  . Hypothyroidism 02/17/2016  . Degeneration macular 02/17/2016  . Arthritis, degenerative 02/17/2016  . Presence of stent in coronary artery 02/17/2016  . Long term prescription benzodiazepine use 02/17/2016  . Long term current use of opiate analgesic 02/17/2016  . Long term prescription opiate use 02/17/2016  . Opiate use 02/17/2016  . Encounter for therapeutic drug level monitoring 02/17/2016  . Encounter for pain management planning 02/17/2016  . Chronic neck pain (Secondary source of pain) (Bilateral) (R>L) 02/17/2016  . Chronic low back pain (Primary Source of Pain) (Bilateral) (R>L) 02/17/2016  . Chronic upper back pain [facility] source of pain) (Bilateral) (R>L) 02/17/2016  . Chronic Arm numbness (Bilateral) (R>L)  02/17/2016  . Bilateral numbness and tingling of arms and legs  (R>L) 02/17/2016  . 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 History of left hip replacement (Depue recalled replacement) 02/17/2016  . MRSA (methicillin resistant Staphylococcus aureus) 02/17/2016    Class: History of  . Lumbar spondylosis 02/17/2016  . Cervical spondylosis 02/17/2016  . Altered mental status 12/16/2015  . Dysphagia 01/16/2014  . Carpal tunnel syndrome (S/P Right side release) (Bilateral) (B) (R>L) 11/15/2013  . Arteriosclerosis of coronary artery 10/17/2013  . Acid reflux 10/17/2013  . BP (high blood pressure) 10/17/2013  . Seizure ([place]) 05/26/2009     Past Medical History:  Diagnosis Date  . Arthritis    ra  . Cardiomegaly   . Cataract   . COPD (chronic obstructive pulmonary disease) ([place])   . Coronary artery disease   . Depression   . Diverticulitis   . Epilepsy ([place])   . Fibromyalgia   . GERD (gastroesophageal reflux disease)   . HOH (hard of hearing)    aids  . Hypertension   . Hypothyroidism   . Inflammatory bowel disease   . Lupus ([place])   . 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 MRSA (methicillin resistant staph aureus) culture positive 1990's  . Opioid dependence ([place])   . Pain    chronic back and neck  . Seizures ([place])    last 2016  . Spinal disorder    stenosis  . Thyroid disease    hypothyroid     Past Surgical History:  Procedure Laterality Date  . ABDOMINAL HYSTERECTOMY    . BACK SURGERY     [age]   cervical fusion/thoracic/lumbar with ray cages  . BREAST SURGERY     implants  . cardiac stents  2013   x 2  . CATARACT EXTRACTION W/PHACO Left 03/30/2016   Procedure: CATARACT EXTRACTION PHACO AND INTRAOCULAR LENS PLACEMENT (IOC);  Surgeon: Birder Robson, MD;  Location: [facility] ORS;  Service: Ophthalmology;  Laterality: Left;  [place] 00:45 AP% 19.6 CDE 8.93 Fluid pack lot # [id number]  . CATARACT EXTRACTION W/PHACO Right 04/27/2016   Procedure: CATARACT EXTRACTION PHACO AND INTRAOCULAR LENS PLACEMENT (IOC);  Surgeon: Birder Robson, MD;  Location: [facility] ORS;  Service: Ophthalmology;  Laterality: Right;  Lot #[id number] H [place]: 00:39.1 AP%: 17.3 CDE: 6.76  . CERVICAL SPINE SURGERY    . CORONARY ANGIOPLASTY     stent x2  . EYE SURGERY    . JOINT REPLACEMENT Left 2007   hip  . rotator cuff surg Right     Social History   Socioeconomic History  . Marital status: Divorced    Spouse name: Not on file  . Number of children: Not on file  . Years of education: Not on file  . Highest education level: Not on file  Occupational History  . Not on file  Tobacco Use  . Smoking status: Former Smoker    Packs/day: 0.50    Quit date: 02/10/2016    Years since quitting: 3.8  . Smokeless tobacco: Never Used  Substance and Sexual Activity  . Alcohol use: No  . Drug use: No  . Sexual activity: Yes    Birth control/protection: Post-menopausal  Other Topics Concern  . Not on file  Social History Narrative  . Not on file   Social Determinants of Health   Financial Resource Strain:   . Difficulty of Paying Living Expenses:   Food Insecurity:   . Worried About Charity fundraiser in the Last Year:   .  Ran Out of Food in the Last Year:   Transportation Needs:   . Film/video editor (Medical):   Marland Kitchen Lack of Transportation (Non-Medical):   Physical Activity:   . Days of Exercise per Week:   . Minutes of Exercise per Session:   Stress:   . Feeling of Stress :   Social Connections:   . Frequency of Communication with Friends and Family:   . Frequency of Social Gatherings with Friends and Family:   . Attends Religious Services:   . Active Member of Clubs or Organizations:   . Attends Archivist Meetings:   Marland Kitchen Marital Status:   Intimate Partner Violence:   . Fear of Current or Ex-Partner:   . Emotionally Abused:   Marland Kitchen Physically Abused:   . Sexually Abused:      Family History  Problem Relation Age of Onset  . Heart disease Mother   . Cancer Father      Current Outpatient Medications:  .   calcium carbonate 1250 MG capsule, Take 1,250 mg by mouth 2 (two) times daily with a meal., Disp: , Rfl:  .  diphenhydrAMINE (BENADRYL) 25 MG tablet, Take 25 mg by mouth every 6 (six) hours as needed., Disp: , Rfl:  .  ketoconazole (NIZORAL) 2 % shampoo, See admin instructions., Disp: , Rfl:  .  levETIRAcetam (KEPPRA) 1000 MG tablet, Take 500 mg by mouth 2 (two) times daily. , Disp: , Rfl:  .  levETIRAcetam (KEPPRA) 500 MG tablet, TAKE 1 TABLET 2 TIMES A DAY ORALLY, Disp: , Rfl:  .  levothyroxine (SYNTHROID, LEVOTHROID) 100 MCG tablet, Take 88 mcg by mouth daily before breakfast. , Disp: , Rfl:  .  Loratadine 10 MG CAPS, Take by mouth., Disp: , Rfl:  .  metoprolol tartrate (LOPRESSOR) 25 MG tablet, Take 25 mg by mouth 2 (two) times daily., Disp: , Rfl:  .  Multiple Vitamins-Minerals (ICAPS AREDS 2 PO), Take 1 tablet by mouth 2 (two) times daily., Disp: , Rfl:  .  omeprazole (PRILOSEC) 20 MG capsule, Take by mouth., Disp: , Rfl:  .  PROAIR HFA 108 (90 Base) MCG/ACT inhaler, 2 puffs every 4 (four) hours as needed. , Disp: , Rfl:  .  SYNTHROID 88 MCG tablet, , Disp: , Rfl:  .  Vitamin D, Ergocalciferol, (DRISDOL) 50000 units CAPS capsule, 50,000 Units every 7 (seven) days. , Disp: , Rfl:    Physical exam: There were no vitals filed for this visit. Physical Exam Constitutional:      Comments: Thin female in no acute distress  Cardiovascular:     Rate and Rhythm: Normal rate and regular rhythm.     Heart sounds: Normal heart sounds.  Pulmonary:     Effort: Pulmonary effort is normal.     Breath sounds: Normal breath sounds.  Abdominal:     General: Bowel sounds are normal.     Palpations: Abdomen is soft.     Comments: No palpable hepatosplenomegaly.   Musculoskeletal:     Cervical back: Normal range of motion.  Lymphadenopathy:     Comments: No palpable cervical, supraclavicular, axillary or inguinal adenopathy   Skin:    General: Skin is warm and dry.  Neurological:     Mental  Status: She is alert and oriented to person, place, and time.        CMP Latest Ref Rng & Units 06/09/2019  Glucose 70 - 99 mg/dL 131(H)  BUN 8 - 23 mg/dL 28(H)  Creatinine 0.44 -  1.00 mg/dL 0.68  Sodium 135 - 145 mmol/L 137  Potassium 3.5 - 5.1 mmol/L 4.3  Chloride 98 - 111 mmol/L 101  CO2 22 - 32 mmol/L 22  Calcium 8.9 - 10.3 mg/dL 9.3  Total Protein 6.5 - 8.1 g/dL -  Total Bilirubin 0.3 - 1.2 mg/dL -  Alkaline Phos 38 - 126 U/L -  AST 15 - 41 U/L -  ALT 0 - 44 U/L -   CBC Latest Ref Rng & Units 06/09/2019  WBC 4.0 - 10.5 K/uL 12.5(H)  Hemoglobin 12.0 - 15.0 g/dL 13.4  Hematocrit 36.0 - 46.0 % 40.2  Platelets 150 - 400 K/uL 262    No images are attached to the encounter.  MR HIP LEFT WO CONTRAST  Result Date: 11/19/2019 CLINICAL DATA:  Left hip pain in a patient who is status post left hip replacement in 2007. EXAM: MR OF THE LEFT HIP WITHOUT CONTRAST TECHNIQUE: Multiplanar, multisequence MR imaging was performed. No intravenous contrast was administered. COMPARISON:  Plain films left hip 08/07/2010. CT abdomen and pelvis 02/13/2019. FINDINGS: Bones: Artifact reduction techniques were used in this patient with a left hip arthroplasty. Marrow signal is normal without fracture, stress change or worrisome lesion. No evidence of loosening of the patient's arthroplasty is identified. No bony destructive change is seen. Thinning of the medial wall of the left acetabulum is better visualized on the patient's prior CT due to artifact from the patient's hip replacement. No fluid collection or mass about the patient's arthroplasty is identified. Articular cartilage and labrum Articular cartilage: Joint space narrowing is seen at the right hip. Labrum:  The superior right labrum is degenerated. Joint or bursal effusion Joint effusion:  None. Bursae: A small volume of fluid is seen in the left trochanteric bursa. Muscles and tendons Muscles and tendons: Intact. There is some tendinosis of the  right gluteus minimus. Other findings Miscellaneous: Imaged intrapelvic contents demonstrate postoperative change of hysterectomy. IMPRESSION: Left hip arthroplasty in place. Thinning of the medial wall of the left acetabulum is better seen on prior CT scan. A trace amount of fluid is seen in the left trochanteric bursa. The patient's left hip replacement is otherwise negative. Right hip osteoarthritis. Electronically Signed   By: Inge Rise M.D.   On: 11/19/2019 09:59    Assessment and plan- Patient is a 74 y.o. female referred for elevated B12 levels  Elevated B12 levels can be associated with increased risk of cancer but the risk is overall very low.  2.3% in patients who had a normal level was a 6.6% with patients were elevated B12 levels.  Therefore the correlation does not have much clinical significance. Elevated B12 levels can be seen in patients with inflammatory conditions such as autoimmune diseases and I will check her for vasculitis panel.  It can be seen in patients with liver failure or renal failure which the patient does not have.  I will however check a CMP today again.  Elevated B12 levels can also be seen in patients with acute leukemia multiple myeloma or myeloproliferative neoplasms.  However patient has a normal CBC with a normal differential thereby making a hemopoietic disorder such as acute leukemia, hypereosinophilic syndrome or myeloproliferative disorders unlikely.  I recommend that patient should be up-to-date with her screening mammograms and colonoscopies but I do not recommend any further screening work-up to look for possible malignancy in the setting of elevated B12 levels.  Also explained to the patient that B12 is a water-soluble vitamin and elevated  levels do not lead to toxicity  Patient had a CT abdomen for hematuria work-up in August 2020 and was not found to have any evidence of malignancy such as liver cancer which can be seen sometimes in conditions like  liver cancer.  She was noted to have a 1.9 cm low-attenuation lesion which was similar to prior scans consistent with a cyst.   Thank you for this kind referral and the opportunity to participate in the care of this patient   Visit Diagnosis 1. Elevated vitamin B12 level     Dr. Randa Evens, MD, MPH [facility] at [facility] [phone] 12/13/2019 1:54 PM

## 2019-12-14 LAB — ANA COMPREHENSIVE PANEL

## 2019-12-15 LAB — URINE CULTURE: Culture: <10000 — AB

## 2019-12-16 ENCOUNTER — Encounter: Payer: Self-pay | Admitting: Oncology

## 2019-12-25 ENCOUNTER — Inpatient Hospital Stay (HOSPITAL_BASED_OUTPATIENT_CLINIC_OR_DEPARTMENT_OTHER): Payer: Medicare Other | Admitting: Oncology

## 2019-12-25 ENCOUNTER — Telehealth: Payer: Self-pay

## 2019-12-25 ENCOUNTER — Encounter: Payer: Self-pay | Admitting: Oncology

## 2019-12-25 ENCOUNTER — Other Ambulatory Visit: Payer: Self-pay

## 2019-12-25 VITALS — BP 101/52 | HR 73 | Temp 97.5°F | Resp 18 | Wt 103.5 lb

## 2019-12-25 DIAGNOSIS — R748 Abnormal levels of other serum enzymes: Secondary | ICD-10-CM

## 2019-12-25 DIAGNOSIS — R109 Unspecified abdominal pain: Secondary | ICD-10-CM | POA: Diagnosis not present

## 2019-12-25 DIAGNOSIS — R7989 Other specified abnormal findings of blood chemistry: Secondary | ICD-10-CM | POA: Diagnosis not present

## 2019-12-25 LAB — URINALYSIS, COMPLETE (UACMP) WITH MICROSCOPIC
Bacteria, UA: NONE SEEN
Bilirubin Urine: NEGATIVE
Glucose, UA: NEGATIVE mg/dL
Hgb urine dipstick: NEGATIVE
Ketones, ur: NEGATIVE mg/dL
Nitrite: NEGATIVE
Protein, ur: NEGATIVE mg/dL
Specific Gravity, Urine: 1.021 (ref 1.005–1.030)
pH: 5 (ref 5.0–8.0)

## 2019-12-25 NOTE — Progress Notes (Signed)
Patient here for oncology follow-up appointment, expresses no complaints of back pain 9/10 and pain with urination.

## 2019-12-25 NOTE — Telephone Encounter (Signed)
-----   Message from Sindy Guadeloupe, MD sent at 12/25/2019  2:44 PM EDT ----- Please let patient know she does not have any evidence of urinary tract infection and does not require any antibiotics at this time.  She does have microscopic hematuria which is known since before and needs to be followed up by Dr. Rosario Jacks

## 2019-12-26 LAB — URINE CULTURE: Culture: <10000 — AB

## 2019-12-28 NOTE — Progress Notes (Signed)
Hematology/Oncology Consult note [facility]  [phone] [phone]  Patient Care Team: Casilda Carls, MD as PCP - General (Internal Medicine)   Name of the patient: Diane Adkins  [id number]  02-16-1946   Date of visit: 12/28/19  Diagnosis-elevated B12 levels etiology unclear  Chief complaint/ Reason for visit-discuss results of blood work  Heme/Onc history: Patient is a 74 year old female with a past medical history significant for seizure disorder, hypothyroidism who has been referred to [place] for elevated B12 level.  Her most recent CBC from 11/13/2019 showed white count of 7.4, H&H of 14.8/43 with an MCV of 87 and a platelet count of 267.  Differential was normal.  TSH normal.  Vitamin D levels normal.B12 levels were found to be greater than 2000. She is not taking po b12.   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 She reports ongoing fatigue. She reports chronic back pain and joint pain  Patient had CT abdomen for hematuria work-up in August 2020 and did not have any evidence of lymphadenopathy or malignancy.Results of blood work from 12/13/2019 showed a normal CBC with differential and a CMP.  ESR was normal at 26 and vasculitis panel was negative.  Interval history-patient complains of bilateral groin pain and back pain that is chronic.  Reports ongoing fatigue  ECOG PS- 2 Pain scale- 0   Review of systems- Review of Systems  Constitutional: Positive for malaise/fatigue. Negative for chills, fever and weight loss.  HENT: Negative for congestion, ear discharge and nosebleeds.   Eyes: Negative for blurred vision.  Respiratory: Negative for cough, hemoptysis, sputum production, shortness of breath and wheezing.   Cardiovascular: Negative for chest pain, palpitations, orthopnea and claudication.  Gastrointestinal: Positive for abdominal pain. Negative for blood in stool,  constipation, diarrhea, heartburn, melena, nausea and vomiting.  Genitourinary: Negative for dysuria, flank pain, frequency, hematuria and urgency.  Musculoskeletal: Positive for back pain. Negative for joint pain and myalgias.  Skin: Negative for rash.  Neurological: Negative for dizziness, tingling, focal weakness, seizures, weakness and headaches.  Endo/Heme/Allergies: Does not bruise/bleed easily.  Psychiatric/Behavioral: Negative for depression and suicidal ideas. The patient does not have insomnia.       Allergies  Allergen Reactions  . Keflex [Cephalexin] Other (See Comments) and Hives    Reaction: unknown  . Lamotrigine Itching and Swelling  . Macrodantin [Nitrofurantoin Macrocrystal] Other (See Comments)    Reaction: unknown  . Nitrofurantoin Hives  . Statins Hives  . Fosamax [Alendronate Sodium] Hives     Past Medical History:  Diagnosis Date  . Arthritis    ra  . Cardiomegaly   . Cataract   . COPD (chronic obstructive pulmonary disease) ([place])   . Coronary artery disease   . Depression   . Diverticulitis   . Epilepsy ([place])   . Fibromyalgia   . GERD (gastroesophageal reflux disease)   . HOH (hard of hearing)    aids  . Hypertension   . Hypothyroidism   . Inflammatory bowel disease   . Lupus ([place])   . 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 MRSA (methicillin resistant staph aureus) culture positive 1990's  . Opioid dependence ([place])   . Pain    chronic back and neck  . Seizures ([place])    last 2016  . Spinal disorder    stenosis  . Thyroid disease    hypothyroid     Past Surgical History:  Procedure Laterality Date  .  ABDOMINAL HYSTERECTOMY    . BACK SURGERY     [age]   cervical fusion/thoracic/lumbar with ray cages  . BREAST SURGERY     implants  . cardiac stents  2013   x 2  . CATARACT EXTRACTION W/PHACO Left 03/30/2016   Procedure: CATARACT EXTRACTION PHACO AND INTRAOCULAR LENS PLACEMENT (IOC);  Surgeon: Birder Robson, MD;  Location: [facility]  ORS;  Service: Ophthalmology;  Laterality: Left;  [place] 00:45 AP% 19.6 CDE 8.93 Fluid pack lot # [id number]  . CATARACT EXTRACTION W/PHACO Right 04/27/2016   Procedure: CATARACT EXTRACTION PHACO AND INTRAOCULAR LENS PLACEMENT (IOC);  Surgeon: Birder Robson, MD;  Location: [facility] ORS;  Service: Ophthalmology;  Laterality: Right;  Lot #[id number] H [place]: 00:39.1 AP%: 17.3 CDE: 6.76  . CERVICAL SPINE SURGERY    . CORONARY ANGIOPLASTY     stent x2  . EYE SURGERY    . JOINT REPLACEMENT Left 2007   hip  . rotator cuff surg Right     Social History   Socioeconomic History  . Marital status: Divorced    Spouse name: Not on file  . Number of children: Not on file  . Years of education: Not on file  . Highest education level: Not on file  Occupational History  . Not on file  Tobacco Use  . Smoking status: Former Smoker    Packs/day: 0.50    Quit date: 02/10/2016    Years since quitting: 3.8  . Smokeless tobacco: Never Used  Substance and Sexual Activity  . Alcohol use: No  . Drug use: No  . Sexual activity: Yes    Birth control/protection: Post-menopausal  Other Topics Concern  . Not on file  Social History Narrative  . Not on file   Social Determinants of Health   Financial Resource Strain:   . Difficulty of Paying Living Expenses:   Food Insecurity:   . Worried About Charity fundraiser in the Last Year:   . Arboriculturist in the Last Year:   Transportation Needs:   . Film/video editor (Medical):   Marland Kitchen Lack of Transportation (Non-Medical):   Physical Activity:   . Days of Exercise per Week:   . Minutes of Exercise per Session:   Stress:   . Feeling of Stress :   Social Connections:   . Frequency of Communication with Friends and Family:   . Frequency of Social Gatherings with Friends and Family:   . Attends Religious Services:   . Active Member of Clubs or Organizations:   . Attends Archivist Meetings:   Marland Kitchen Marital Status:   Intimate Partner Violence:   .  Fear of Current or Ex-Partner:   . Emotionally Abused:   Marland Kitchen Physically Abused:   . Sexually Abused:     Family History  Problem Relation Age of Onset  . Heart disease Mother   . Cancer Father      Current Outpatient Medications:  .  albuterol (VENTOLIN HFA) 108 (90 Base) MCG/ACT inhaler, albuterol sulfate HFA 90 mcg/actuation aerosol inhaler  INHALE 2 PUFFS EVERY 4 6HOURS, Disp: , Rfl:  .  calcium carbonate 1250 MG capsule, Take 1,250 mg by mouth 2 (two) times daily with a meal., Disp: , Rfl:  .  diphenhydrAMINE (BENADRYL) 25 MG tablet, Take 25 mg by mouth every 6 (six) hours as needed., Disp: , Rfl:  .  levETIRAcetam (KEPPRA) 500 MG tablet, TAKE 1 TABLET 2 TIMES A DAY ORALLY, Disp: , Rfl:  .  levothyroxine (  SYNTHROID) 75 MCG tablet, Synthroid 75 mcg tablet, Disp: , Rfl:  .  Loratadine 10 MG CAPS, Take by mouth., Disp: , Rfl:  .  metoprolol tartrate (LOPRESSOR) 25 MG tablet, Take 25 mg by mouth 2 (two) times daily., Disp: , Rfl:  .  Multiple Vitamins-Minerals (ICAPS AREDS 2 PO), Take 1 tablet by mouth 2 (two) times daily., Disp: , Rfl:  .  omeprazole (PRILOSEC) 20 MG capsule, Take by mouth., Disp: , Rfl:  .  oxyCODONE (ROXICODONE) 15 MG immediate release tablet, Take 15 mg by mouth in the morning and at bedtime., Disp: , Rfl:  .  PROAIR HFA 108 (90 Base) MCG/ACT inhaler, 2 puffs every 4 (four) hours as needed. , Disp: , Rfl:  .  Vitamin D, Ergocalciferol, (DRISDOL) 50000 units CAPS capsule, 50,000 Units every 7 (seven) days. , Disp: , Rfl:  .  ketoconazole (NIZORAL) 2 % shampoo, See admin instructions. (Patient not taking: Reported on 12/25/2019), Disp: , Rfl:  .  levETIRAcetam (KEPPRA) 1000 MG tablet, Take 500 mg by mouth 2 (two) times daily.  (Patient not taking: Reported on 12/25/2019), Disp: , Rfl:  .  levothyroxine (SYNTHROID, LEVOTHROID) 100 MCG tablet, Take 88 mcg by mouth daily before breakfast.  (Patient not taking: Reported on 12/25/2019), Disp: , Rfl:  .  methocarbamol (ROBAXIN)  500 MG tablet, methocarbamol 500 mg tablet  TAKE 1 TABLET BY MOUTH TWICE A DAY (Patient not taking: Reported on 12/25/2019), Disp: , Rfl:  .  SYNTHROID 88 MCG tablet, , Disp: , Rfl:   Physical exam:  Vitals:   12/25/19 1319  BP: (!) 101/52  Pulse: 73  Resp: 18  Temp: (!) 97.5 F (36.4 C)  TempSrc: Tympanic  SpO2: 94%  Weight: 103 lb 8 oz (46.9 kg)   Physical Exam Constitutional:      General: She is not in acute distress.    Comments: Patient is thin and frail.  Sitting in a wheelchair appears in no acute distress  Cardiovascular:     Rate and Rhythm: Normal rate and regular rhythm.     Heart sounds: Normal heart sounds.  Pulmonary:     Effort: Pulmonary effort is normal.     Breath sounds: Normal breath sounds.  Abdominal:     General: Bowel sounds are normal.     Palpations: Abdomen is soft.  Skin:    General: Skin is warm and dry.  Neurological:     Mental Status: She is alert and oriented to person, place, and time.      CMP Latest Ref Rng & Units 12/13/2019  Glucose 70 - 99 mg/dL 80  BUN 8 - 23 mg/dL 35(H)  Creatinine 0.44 - 1.00 mg/dL 0.76  Sodium 135 - 145 mmol/L 141  Potassium 3.5 - 5.1 mmol/L 4.4  Chloride 98 - 111 mmol/L 103  CO2 22 - 32 mmol/L 30  Calcium 8.9 - 10.3 mg/dL 9.6  Total Protein 6.5 - 8.1 g/dL 7.8  Total Bilirubin 0.3 - 1.2 mg/dL 0.6  Alkaline Phos 38 - 126 U/L 77  AST 15 - 41 U/L 17  ALT 0 - 44 U/L 12   CBC Latest Ref Rng & Units 12/13/2019  WBC 4.0 - 10.5 K/uL 8.3  Hemoglobin 12.0 - 15.0 g/dL 13.8  Hematocrit 36 - 46 % 41.0  Platelets 150 - 400 K/uL 292     Assessment and plan- Patient is a 74 y.o. female referred for elevated B12  Elevated B12 levels are nonspecific and can be  seen in variety of conditions.  Patient had a CT abdomen back in August 2020 which did not show any evidence of malignancy.  Her renal functions and liver functions are normal.  No evidence of vasculitis.  She has a normal CBC with differential thereby making MDS,  multiple myeloma or myeloproliferative disorder as a cause of elevated B12 unlikely.  From my standpoint elevated B12 level at this time does not require any further work-up and need not be monitored.    I would again recommend patient should be up-to-date with her mammogram and colonoscopy but does not require any further malignancy surveillance at this time for an elevated B12  Patient is currently reporting backslash flank pain and states that she has had UTIs in the past.  We will get a urinalysis and urine culture today  No follow-up required at this time from hematology standpoint   Visit Diagnosis 1. Flank pain   2. Elevated vitamin B12 level      Dr. Randa Evens, MD, MPH [facility] [phone] 12/28/2019 10:29 AM

## 2020-01-17 ENCOUNTER — Other Ambulatory Visit: Payer: Self-pay | Admitting: Orthopedic Surgery

## 2020-01-18 ENCOUNTER — Encounter
Admission: RE | Admit: 2020-01-18 | Discharge: 2020-01-18 | Disposition: A | Discharge status: Home / Self Care | Payer: Medicare Other | Source: Ambulatory Visit | Attending: Orthopedic Surgery | Admitting: Orthopedic Surgery

## 2020-01-18 ENCOUNTER — Other Ambulatory Visit: Payer: Self-pay

## 2020-01-18 NOTE — Patient Instructions (Signed)
Your procedure is scheduled on: 01/24/20 Report to [place]. To find out your arrival time please call [phone] between 1PM - 3PM on 01/23/20.  Remember: Instructions that are not followed completely may result in serious medical risk, up to and including death, or upon the discretion of your surgeon and anesthesiologist your surgery may need to be rescheduled.     _X__ 1. Do not eat food after midnight the night before your procedure.                 No gum chewing or hard candies. You may drink clear liquids up to 2 hours                 before you are scheduled to arrive for your surgery- DO not drink clear                 liquids within 2 hours of the start of your surgery.                 Clear Liquids include:  water, apple juice without pulp, clear carbohydrate                 drink such as Clearfast or Gatorade, Black Coffee or Tea (Do not add                 anything to coffee or tea). Diabetics water only  __X__2.  On the morning of surgery brush your teeth with toothpaste and water, you                 may rinse your mouth with mouthwash if you wish.  Do not swallow any              toothpaste of mouthwash.     _X__ 3.  No Alcohol for 24 hours before or after surgery.   _X__ 4.  Do Not Smoke or use e-cigarettes For 24 Hours Prior to Your Surgery.                 Do not use any chewable tobacco products for at least 6 hours prior to                 surgery.  ____  5.  Bring all medications with you on the day of surgery if instructed.   __X__  6.  Notify your doctor if there is any change in your medical condition      (cold, fever, infections).     Do not wear jewelry, make-up, hairpins, clips or nail polish. Do not wear lotions, powders, or perfumes.  Do not shave 48 hours prior to surgery. Men may shave face and neck. Do not bring valuables to the hospital.    [facility] is not responsible for any belongings or  valuables.  Contacts, dentures/partials or body piercings may not be worn into surgery. Bring a case for your contacts, glasses or hearing aids, a denture cup will be supplied. Leave your suitcase in the car. After surgery it may be brought to your room. For patients admitted to the hospital, discharge time is determined by your treatment team.   Patients discharged the day of surgery will not be allowed to drive home.   Please read over the following fact sheets that you were given:   MRSA Information  __X__ Take these medicines the morning of surgery with A SIP OF WATER:  1. gabapentin (NEURONTIN) 400 MG capsule  2. levETIRAcetam (KEPPRA) 500 MG tablet  3. levothyroxine (SYNTHROID) 75 MCG tablet  4. metoprolol tartrate (LOPRESSOR) 25 MG tablet  5. omeprazole (PRILOSEC) 20 MG capsule  6. oxyCODONE (ROXICODONE) 15 MG immediate release tablet  ____ Fleet Enema (as directed)   __X__ Use CHG Soap/SAGE wipes as directed  ____ Use inhalers on the day of surgery  ____ Stop metformin/Janumet/Farxiga 2 days prior to surgery    ____ Take 1/2 of usual insulin dose the night before surgery. No insulin the morning          of surgery.   ____ Stop Blood Thinners Coumadin/Plavix/Xarelto/Pleta/Pradaxa/Eliquis/Effient/Aspirin  on   Or contact your Surgeon, Cardiologist or Medical Doctor regarding  ability to stop your blood thinners  __X__ Stop Anti-inflammatories 7 days before surgery such as Advil, Ibuprofen, Motrin,  BC or Goodies Powder, Naprosyn, Naproxen, Aleve, Aspirin    __X__ Stop all herbal supplements, fish oil or vitamin E until after surgery.    ____ Bring C-Pap to the hospital.

## 2020-01-18 NOTE — Pre-Procedure Instructions (Signed)
When attempting to print patients AVS was notified I could not print b/c she had a guardian. During interview patient was asked if she had a guardian and she denied. Guardian was listed as a neighbor named Public relations account executive. Was able to contact "Diane Adkins" and he stated that he was no longer her guardian. Only information provided was patients name, my name credentials and employer.

## 2020-01-22 ENCOUNTER — Other Ambulatory Visit: Payer: Self-pay

## 2020-01-22 ENCOUNTER — Encounter
Admission: RE | Admit: 2020-01-22 | Discharge: 2020-01-22 | Disposition: A | Discharge status: Home / Self Care | Payer: Medicare Other | Source: Ambulatory Visit | Attending: Orthopedic Surgery | Admitting: Orthopedic Surgery

## 2020-01-22 DIAGNOSIS — Z20822 Contact with and (suspected) exposure to covid-19: Secondary | ICD-10-CM | POA: Diagnosis not present

## 2020-01-22 DIAGNOSIS — I10 Essential (primary) hypertension: Secondary | ICD-10-CM | POA: Diagnosis not present

## 2020-01-22 DIAGNOSIS — Z01818 Encounter for other preprocedural examination: Secondary | ICD-10-CM | POA: Insufficient documentation

## 2020-01-22 LAB — BASIC METABOLIC PANEL
Anion gap: 8 (ref 5–15)
BUN: 20 mg/dL (ref 8–23)
CO2: 28 mmol/L (ref 22–32)
Calcium: 9.2 mg/dL (ref 8.9–10.3)
Chloride: 104 mmol/L (ref 98–111)
Creatinine, Ser: 0.8 mg/dL (ref 0.44–1.00)
GFR calc Af Amer: >60 mL/min (ref >60)
GFR calc non Af Amer: >60 mL/min (ref >60)
Glucose, Bld: 85 mg/dL (ref 70–99)
Potassium: 4 mmol/L (ref 3.5–5.1)
Sodium: 140 mmol/L (ref 135–145)

## 2020-01-22 LAB — URINALYSIS, ROUTINE W REFLEX MICROSCOPIC
Bacteria, UA: NONE SEEN
Bilirubin Urine: NEGATIVE
Glucose, UA: NEGATIVE mg/dL
Ketones, ur: NEGATIVE mg/dL
Nitrite: NEGATIVE
Protein, ur: 30 mg/dL — AB
Specific Gravity, Urine: 1.024 (ref 1.005–1.030)
pH: 5 (ref 5.0–8.0)

## 2020-01-22 LAB — CBC
HCT: 41.1 % (ref 36.0–46.0)
Hemoglobin: 13.7 g/dL (ref 12.0–15.0)
MCH: 29.9 pg (ref 26.0–34.0)
MCHC: 33.3 g/dL (ref 30.0–36.0)
MCV: 89.7 fL (ref 80.0–100.0)
Platelets: 256 10*3/uL (ref 150–400)
RBC: 4.58 MIL/uL (ref 3.87–5.11)
RDW: 12.7 % (ref 11.5–15.5)
WBC: 5.3 10*3/uL (ref 4.0–10.5)
nRBC: 0 % (ref 0.0–0.2)

## 2020-01-22 LAB — SURGICAL PCR SCREEN
MRSA, PCR: NEGATIVE
Staphylococcus aureus: NEGATIVE

## 2020-01-22 LAB — PROTIME-INR
INR: 1 (ref 0.8–1.2)
Prothrombin Time: 12.6 seconds (ref 11.4–15.2)

## 2020-01-22 LAB — APTT: aPTT: 34 seconds (ref 24–36)

## 2020-01-22 LAB — TYPE AND SCREEN
ABO/RH(D): A POS
Antibody Screen: NEGATIVE

## 2020-01-23 ENCOUNTER — Ambulatory Visit (INDEPENDENT_AMBULATORY_CARE_PROVIDER_SITE_OTHER): Payer: Medicare Other | Admitting: Dermatology

## 2020-01-23 ENCOUNTER — Ambulatory Visit: Payer: Medicare Other | Admitting: Dermatology

## 2020-01-23 DIAGNOSIS — R21 Rash and other nonspecific skin eruption: Secondary | ICD-10-CM | POA: Diagnosis not present

## 2020-01-23 LAB — SARS CORONAVIRUS 2 (TAT 6-24 HRS): SARS Coronavirus 2: NEGATIVE

## 2020-01-23 MED ORDER — MUPIROCIN 2 % EX OINT: 1.0000 "application " | TOPICAL_OINTMENT | Freq: Two times a day (BID) | CUTANEOUS | 0 refills | Status: DC

## 2020-01-23 NOTE — Progress Notes (Signed)
   Follow-Up Visit   Subjective  Diane Adkins is a 74 y.o. female who presents for the following: area of concern.  Patient presents today for evaluation of a few areas of concern, one on her right temple area (raised area, now flat)  and one on the right ears. Has just been using Alcohol on area. Patient states that she wants clearance to have surgery tomorrow for a hip revision.  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  The following portions of the chart were reviewed this encounter and updated as appropriate:      Review of Systems:  No other skin or systemic complaints except as noted in HPI or Assessment and Plan.  Objective  Well appearing patient in no apparent distress; mood and affect are within normal limits.  A focused examination was performed including Right ear, and Right temple. Relevant physical exam findings are noted in the Assessment and Plan.  Objective  Right Mid Helix, R Temple Scalp: Healing excoriation/erosion on R mid helix.  R temple scalp with localized mild erythema over vessel, prominent R temporal artery   Assessment & Plan  Rash Right Mid Helix, R Temple Scalp  Excoriation with no evidence of infection, may proceed with surgery Start Mupirocin ointment to healing excoriations ear/scalp twice daily as needed.  Call PCP for evaluation if patient gets a really bad headache and swelling/redness of vessel in R temple scalp.  mupirocin ointment (BACTROBAN) 2 % - Right Mid Helix, R Temple Scalp  Return if symptoms worsen or fail to improve.  Marene Lenz, CMA, am acting as scribe for Brendolyn Patty, MD .  Documentation: I have reviewed the above documentation for accuracy and completeness, and I agree with the above.  Brendolyn Patty MD

## 2020-01-23 NOTE — Patient Instructions (Signed)
Recommend daily broad spectrum sunscreen SPF 30+ to sun-exposed areas, reapply every 2 hours as needed. Call for new or changing lesions.  

## 2020-01-24 ENCOUNTER — Inpatient Hospital Stay: Payer: Medicare Other

## 2020-01-24 ENCOUNTER — Other Ambulatory Visit: Payer: Self-pay

## 2020-01-24 ENCOUNTER — Inpatient Hospital Stay: Payer: Medicare Other | Admitting: Anesthesiology

## 2020-01-24 ENCOUNTER — Observation Stay
Admission: RE | Admit: 2020-01-24 | Discharge: 2020-01-25 | Disposition: A | Discharge status: Home / Self Care | Payer: Medicare Other | Attending: Orthopedic Surgery | Admitting: Orthopedic Surgery

## 2020-01-24 ENCOUNTER — Encounter
Admission: RE | Disposition: A | Discharge status: Home / Self Care | Payer: Self-pay | Source: Home / Self Care | Attending: Orthopedic Surgery

## 2020-01-24 ENCOUNTER — Encounter: Payer: Self-pay | Admitting: Orthopedic Surgery

## 2020-01-24 ENCOUNTER — Other Ambulatory Visit: Payer: Medicare Other

## 2020-01-24 DIAGNOSIS — I119 Hypertensive heart disease without heart failure: Secondary | ICD-10-CM | POA: Insufficient documentation

## 2020-01-24 DIAGNOSIS — Z79899 Other long term (current) drug therapy: Secondary | ICD-10-CM | POA: Diagnosis not present

## 2020-01-24 DIAGNOSIS — J449 Chronic obstructive pulmonary disease, unspecified: Secondary | ICD-10-CM | POA: Diagnosis not present

## 2020-01-24 DIAGNOSIS — I2510 Atherosclerotic heart disease of native coronary artery without angina pectoris: Secondary | ICD-10-CM | POA: Diagnosis not present

## 2020-01-24 DIAGNOSIS — E039 Hypothyroidism, unspecified: Secondary | ICD-10-CM | POA: Insufficient documentation

## 2020-01-24 DIAGNOSIS — T8489XA Other specified complication of internal orthopedic prosthetic devices, implants and grafts, initial encounter: Secondary | ICD-10-CM | POA: Diagnosis not present

## 2020-01-24 DIAGNOSIS — Z419 Encounter for procedure for purposes other than remedying health state, unspecified: Secondary | ICD-10-CM

## 2020-01-24 DIAGNOSIS — M25552 Pain in left hip: Secondary | ICD-10-CM | POA: Insufficient documentation

## 2020-01-24 DIAGNOSIS — Z96642 Presence of left artificial hip joint: Secondary | ICD-10-CM | POA: Diagnosis present

## 2020-01-24 DIAGNOSIS — Z87891 Personal history of nicotine dependence: Secondary | ICD-10-CM | POA: Insufficient documentation

## 2020-01-24 HISTORY — PX: TOTAL HIP REVISION: SHX763

## 2020-01-24 SURGERY — TOTAL HIP REVISION
Anesthesia: General | Site: Hip | Laterality: Left

## 2020-01-24 MED ORDER — ORAL CARE MOUTH RINSE: 15.0000 mL | Freq: Once | OROMUCOSAL | Status: AC

## 2020-01-24 MED ORDER — ACETAMINOPHEN 10 MG/ML IV SOLN
INTRAVENOUS | Status: DC | PRN
  Administered 2020-01-24: 1000 mg via INTRAVENOUS

## 2020-01-24 MED ORDER — TRANEXAMIC ACID-NACL 1000-0.7 MG/100ML-% IV SOLN
1000.0000 mg | INTRAVENOUS | Status: AC
  Administered 2020-01-24: 1000 mg via INTRAVENOUS

## 2020-01-24 MED ORDER — METOPROLOL TARTRATE 25 MG PO TABS
25.0000 mg | ORAL_TABLET | Freq: Two times a day (BID) | ORAL | Status: DC
  Administered 2020-01-24 – 2020-01-25 (×2): 25 mg via ORAL
  Filled 2020-01-24 (×2): qty 1

## 2020-01-24 MED ORDER — TRANEXAMIC ACID-NACL 1000-0.7 MG/100ML-% IV SOLN
INTRAVENOUS | Status: AC
  Filled 2020-01-24: qty 100

## 2020-01-24 MED ORDER — DOCUSATE SODIUM 100 MG PO CAPS
100.0000 mg | ORAL_CAPSULE | Freq: Two times a day (BID) | ORAL | Status: DC
  Administered 2020-01-24 – 2020-01-25 (×2): 100 mg via ORAL
  Filled 2020-01-24 (×2): qty 1

## 2020-01-24 MED ORDER — HYDROCODONE-ACETAMINOPHEN 5-325 MG PO TABS: 1.0000 | ORAL_TABLET | ORAL | Status: DC | PRN

## 2020-01-24 MED ORDER — METOCLOPRAMIDE HCL 5 MG/ML IJ SOLN: 5.0000 mg | Freq: Three times a day (TID) | INTRAMUSCULAR | Status: DC | PRN

## 2020-01-24 MED ORDER — CLINDAMYCIN PHOSPHATE 600 MG/50ML IV SOLN
600.0000 mg | Freq: Four times a day (QID) | INTRAVENOUS | Status: AC
  Administered 2020-01-24 – 2020-01-25 (×2): 600 mg via INTRAVENOUS
  Filled 2020-01-24: qty 50

## 2020-01-24 MED ORDER — SODIUM CHLORIDE 0.9 % IV SOLN
INTRAVENOUS | Status: DC | PRN
  Administered 2020-01-24: 10 ug/min via INTRAVENOUS

## 2020-01-24 MED ORDER — KETAMINE HCL 50 MG/ML IJ SOLN
INTRAMUSCULAR | Status: AC
  Filled 2020-01-24: qty 10

## 2020-01-24 MED ORDER — DEXAMETHASONE SODIUM PHOSPHATE 10 MG/ML IJ SOLN
INTRAMUSCULAR | Status: DC | PRN
  Administered 2020-01-24: 10 mg via INTRAVENOUS

## 2020-01-24 MED ORDER — FAMOTIDINE 20 MG PO TABS
ORAL_TABLET | ORAL | Status: AC
  Administered 2020-01-24: 20 mg via ORAL
  Filled 2020-01-24: qty 1

## 2020-01-24 MED ORDER — ACETAMINOPHEN 325 MG PO TABS: 325.0000 mg | ORAL_TABLET | Freq: Four times a day (QID) | ORAL | Status: DC | PRN

## 2020-01-24 MED ORDER — BUPIVACAINE-EPINEPHRINE (PF) 0.25% -1:200000 IJ SOLN
INTRAMUSCULAR | Status: DC | PRN
  Administered 2020-01-24: 20 mL via PERINEURAL

## 2020-01-24 MED ORDER — MORPHINE SULFATE (PF) 2 MG/ML IV SOLN
0.5000 mg | INTRAVENOUS | Status: DC | PRN
  Administered 2020-01-24: 1 mg via INTRAVENOUS
  Filled 2020-01-24: qty 1

## 2020-01-24 MED ORDER — BACITRACIN 50000 UNITS IM SOLR
INTRAMUSCULAR | Status: AC
  Filled 2020-01-24: qty 2

## 2020-01-24 MED ORDER — PROPOFOL 10 MG/ML IV BOLUS
INTRAVENOUS | Status: DC | PRN
  Administered 2020-01-24: 80 mg via INTRAVENOUS

## 2020-01-24 MED ORDER — ACETAMINOPHEN 325 MG PO TABS: 325.0000 mg | ORAL_TABLET | ORAL | Status: DC | PRN

## 2020-01-24 MED ORDER — ASPIRIN 81 MG PO CHEW
81.0000 mg | CHEWABLE_TABLET | Freq: Two times a day (BID) | ORAL | Status: DC
  Administered 2020-01-25: 81 mg via ORAL
  Filled 2020-01-24 (×3): qty 1

## 2020-01-24 MED ORDER — FENTANYL CITRATE (PF) 100 MCG/2ML IJ SOLN
INTRAMUSCULAR | Status: AC
  Administered 2020-01-24: 25 ug via INTRAVENOUS
  Filled 2020-01-24: qty 2

## 2020-01-24 MED ORDER — ALUM & MAG HYDROXIDE-SIMETH 200-200-20 MG/5ML PO SUSP: 30.0000 mL | ORAL | Status: DC | PRN

## 2020-01-24 MED ORDER — HYDROMORPHONE HCL 1 MG/ML IJ SOLN
INTRAMUSCULAR | Status: DC | PRN
  Administered 2020-01-24 (×2): .5 mg via INTRAVENOUS

## 2020-01-24 MED ORDER — GABAPENTIN 400 MG PO CAPS
400.0000 mg | ORAL_CAPSULE | Freq: Two times a day (BID) | ORAL | Status: DC | PRN
  Administered 2020-01-24: 400 mg via ORAL
  Filled 2020-01-24: qty 1

## 2020-01-24 MED ORDER — MENTHOL 3 MG MT LOZG
1.0000 | LOZENGE | OROMUCOSAL | Status: DC | PRN
  Filled 2020-01-24: qty 9

## 2020-01-24 MED ORDER — LABETALOL HCL 5 MG/ML IV SOLN
INTRAVENOUS | Status: AC
  Filled 2020-01-24: qty 4

## 2020-01-24 MED ORDER — SODIUM CHLORIDE 0.9 % IV SOLN: INTRAVENOUS | Status: DC | PRN

## 2020-01-24 MED ORDER — ALBUTEROL SULFATE (2.5 MG/3ML) 0.083% IN NEBU: 2.5000 mg | INHALATION_SOLUTION | RESPIRATORY_TRACT | Status: DC | PRN

## 2020-01-24 MED ORDER — GLYCOPYRROLATE 0.2 MG/ML IJ SOLN
INTRAMUSCULAR | Status: DC | PRN
  Administered 2020-01-24: .2 mg via INTRAVENOUS

## 2020-01-24 MED ORDER — BUPIVACAINE-EPINEPHRINE (PF) 0.25% -1:200000 IJ SOLN
INTRAMUSCULAR | Status: AC
  Filled 2020-01-24: qty 30

## 2020-01-24 MED ORDER — PHENOL 1.4 % MT LIQD
1.0000 | OROMUCOSAL | Status: DC | PRN
  Filled 2020-01-24: qty 177

## 2020-01-24 MED ORDER — MAGNESIUM CITRATE PO SOLN
1.0000 | Freq: Once | ORAL | Status: DC | PRN
  Filled 2020-01-24: qty 296

## 2020-01-24 MED ORDER — FENTANYL CITRATE (PF) 100 MCG/2ML IJ SOLN
INTRAMUSCULAR | Status: DC | PRN
  Administered 2020-01-24: 50 ug via INTRAVENOUS
  Administered 2020-01-24: 100 ug via INTRAVENOUS
  Administered 2020-01-24 (×2): 25 ug via INTRAVENOUS
  Administered 2020-01-24: 50 ug via INTRAVENOUS

## 2020-01-24 MED ORDER — BISACODYL 10 MG RE SUPP: 10.0000 mg | Freq: Every day | RECTAL | Status: DC | PRN

## 2020-01-24 MED ORDER — SUGAMMADEX SODIUM 200 MG/2ML IV SOLN
INTRAVENOUS | Status: DC | PRN
  Administered 2020-01-24 (×2): 100 mg via INTRAVENOUS

## 2020-01-24 MED ORDER — LEVETIRACETAM 500 MG PO TABS
500.0000 mg | ORAL_TABLET | Freq: Two times a day (BID) | ORAL | Status: DC
  Administered 2020-01-24 – 2020-01-25 (×2): 500 mg via ORAL
  Filled 2020-01-24 (×3): qty 1

## 2020-01-24 MED ORDER — FAMOTIDINE 20 MG PO TABS: 20.0000 mg | ORAL_TABLET | Freq: Once | ORAL | Status: AC

## 2020-01-24 MED ORDER — FENTANYL CITRATE (PF) 100 MCG/2ML IJ SOLN
INTRAMUSCULAR | Status: AC
  Filled 2020-01-24: qty 2

## 2020-01-24 MED ORDER — ONDANSETRON HCL 4 MG/2ML IJ SOLN
INTRAMUSCULAR | Status: DC | PRN
  Administered 2020-01-24 (×2): 4 mg via INTRAVENOUS

## 2020-01-24 MED ORDER — KETOROLAC TROMETHAMINE 30 MG/ML IJ SOLN
INTRAMUSCULAR | Status: DC | PRN
  Administered 2020-01-24: 30 mg via INTRAVENOUS

## 2020-01-24 MED ORDER — CLINDAMYCIN PHOSPHATE 900 MG/50ML IV SOLN
INTRAVENOUS | Status: AC
  Filled 2020-01-24: qty 50

## 2020-01-24 MED ORDER — LACTATED RINGERS IV SOLN: INTRAVENOUS | Status: DC

## 2020-01-24 MED ORDER — HYDROCODONE-ACETAMINOPHEN 7.5-325 MG PO TABS
1.0000 | ORAL_TABLET | ORAL | Status: DC | PRN
  Administered 2020-01-25 (×2): 2 via ORAL
  Filled 2020-01-24 (×3): qty 2

## 2020-01-24 MED ORDER — KETAMINE HCL 50 MG/ML IJ SOLN
INTRAMUSCULAR | Status: DC | PRN
  Administered 2020-01-24: 5 mg via INTRAMUSCULAR
  Administered 2020-01-24: 10 mg via INTRAMUSCULAR
  Administered 2020-01-24: 5 mg via INTRAMUSCULAR

## 2020-01-24 MED ORDER — MAGNESIUM HYDROXIDE 400 MG/5ML PO SUSP: 30.0000 mL | Freq: Every day | ORAL | Status: DC | PRN

## 2020-01-24 MED ORDER — POVIDONE-IODINE 10 % EX SWAB: 2.0000 "application " | Freq: Once | CUTANEOUS | Status: DC

## 2020-01-24 MED ORDER — LEVOTHYROXINE SODIUM 75 MCG PO TABS
75.0000 ug | ORAL_TABLET | Freq: Every day | ORAL | Status: DC
  Administered 2020-01-25: 75 ug via ORAL
  Filled 2020-01-24: qty 3
  Filled 2020-01-24: qty 1

## 2020-01-24 MED ORDER — PANTOPRAZOLE SODIUM 40 MG PO TBEC
40.0000 mg | DELAYED_RELEASE_TABLET | Freq: Every day | ORAL | Status: DC
  Administered 2020-01-24 – 2020-01-25 (×2): 40 mg via ORAL
  Filled 2020-01-24 (×2): qty 1

## 2020-01-24 MED ORDER — OXYCODONE HCL 5 MG PO TABS
15.0000 mg | ORAL_TABLET | ORAL | Status: DC | PRN
  Administered 2020-01-24 – 2020-01-25 (×4): 15 mg via ORAL
  Filled 2020-01-24 (×4): qty 3

## 2020-01-24 MED ORDER — ONDANSETRON HCL 4 MG/2ML IJ SOLN
INTRAMUSCULAR | Status: AC
  Filled 2020-01-24: qty 2

## 2020-01-24 MED ORDER — ONDANSETRON HCL 4 MG/2ML IJ SOLN: 4.0000 mg | Freq: Once | INTRAMUSCULAR | Status: DC | PRN

## 2020-01-24 MED ORDER — ACETAMINOPHEN 160 MG/5ML PO SOLN
325.0000 mg | ORAL | Status: DC | PRN
  Filled 2020-01-24: qty 20.3

## 2020-01-24 MED ORDER — ROCURONIUM BROMIDE 100 MG/10ML IV SOLN
INTRAVENOUS | Status: DC | PRN
  Administered 2020-01-24: 10 mg via INTRAVENOUS
  Administered 2020-01-24: 50 mg via INTRAVENOUS

## 2020-01-24 MED ORDER — ACETAMINOPHEN 10 MG/ML IV SOLN
INTRAVENOUS | Status: AC
  Filled 2020-01-24: qty 100

## 2020-01-24 MED ORDER — HYDROCODONE-ACETAMINOPHEN 5-325 MG PO TABS
ORAL_TABLET | ORAL | Status: AC
  Administered 2020-01-24: 2 via ORAL
  Filled 2020-01-24: qty 2

## 2020-01-24 MED ORDER — METOCLOPRAMIDE HCL 10 MG PO TABS: 5.0000 mg | ORAL_TABLET | Freq: Three times a day (TID) | ORAL | Status: DC | PRN

## 2020-01-24 MED ORDER — MIDAZOLAM HCL 2 MG/2ML IJ SOLN
INTRAMUSCULAR | Status: AC
  Filled 2020-01-24: qty 2

## 2020-01-24 MED ORDER — SODIUM CHLORIDE 0.9 % IR SOLN: Status: DC | PRN

## 2020-01-24 MED ORDER — HYDROMORPHONE HCL 1 MG/ML IJ SOLN
INTRAMUSCULAR | Status: AC
  Filled 2020-01-24: qty 1

## 2020-01-24 MED ORDER — METOPROLOL TARTRATE 5 MG/5ML IV SOLN
INTRAVENOUS | Status: AC
  Filled 2020-01-24: qty 5

## 2020-01-24 MED ORDER — LABETALOL HCL 5 MG/ML IV SOLN
INTRAVENOUS | Status: DC | PRN
  Administered 2020-01-24 (×2): 5 mg via INTRAVENOUS

## 2020-01-24 MED ORDER — ACETAMINOPHEN 500 MG PO TABS: 500.0000 mg | ORAL_TABLET | Freq: Four times a day (QID) | ORAL | Status: DC

## 2020-01-24 MED ORDER — ONDANSETRON HCL 4 MG PO TABS: 4.0000 mg | ORAL_TABLET | Freq: Four times a day (QID) | ORAL | Status: DC | PRN

## 2020-01-24 MED ORDER — KETOROLAC TROMETHAMINE 15 MG/ML IJ SOLN
15.0000 mg | Freq: Four times a day (QID) | INTRAMUSCULAR | Status: DC
  Administered 2020-01-24 – 2020-01-25 (×3): 15 mg via INTRAVENOUS
  Filled 2020-01-24 (×4): qty 1

## 2020-01-24 MED ORDER — SODIUM CHLORIDE FLUSH 0.9 % IV SOLN
INTRAVENOUS | Status: AC
  Filled 2020-01-24: qty 20

## 2020-01-24 MED ORDER — FENTANYL CITRATE (PF) 100 MCG/2ML IJ SOLN
25.0000 ug | INTRAMUSCULAR | Status: DC | PRN
  Administered 2020-01-24 (×3): 25 ug via INTRAVENOUS

## 2020-01-24 MED ORDER — ONDANSETRON HCL 4 MG/2ML IJ SOLN: 4.0000 mg | Freq: Four times a day (QID) | INTRAMUSCULAR | Status: DC | PRN

## 2020-01-24 MED ORDER — CHLORHEXIDINE GLUCONATE 0.12 % MT SOLN: 15.0000 mL | Freq: Once | OROMUCOSAL | Status: AC

## 2020-01-24 MED ORDER — CHLORHEXIDINE GLUCONATE 0.12 % MT SOLN
OROMUCOSAL | Status: AC
  Administered 2020-01-24: 15 mL via OROMUCOSAL
  Filled 2020-01-24: qty 15

## 2020-01-24 MED ORDER — DEXMEDETOMIDINE HCL IN NACL 200 MCG/50ML IV SOLN
INTRAVENOUS | Status: DC | PRN
  Administered 2020-01-24 (×2): 8 ug via INTRAVENOUS

## 2020-01-24 MED ORDER — LIDOCAINE HCL (CARDIAC) PF 100 MG/5ML IV SOSY
PREFILLED_SYRINGE | INTRAVENOUS | Status: DC | PRN
  Administered 2020-01-24: 40 mg via INTRAVENOUS

## 2020-01-24 MED ORDER — CLINDAMYCIN PHOSPHATE 600 MG/50ML IV SOLN
INTRAVENOUS | Status: AC
  Filled 2020-01-24: qty 50

## 2020-01-24 MED ORDER — ALBUTEROL SULFATE HFA 108 (90 BASE) MCG/ACT IN AERS
INHALATION_SPRAY | RESPIRATORY_TRACT | Status: AC
  Filled 2020-01-24: qty 6.7

## 2020-01-24 MED ORDER — CLINDAMYCIN PHOSPHATE 900 MG/50ML IV SOLN
900.0000 mg | INTRAVENOUS | Status: AC
  Administered 2020-01-24: 900 mg via INTRAVENOUS

## 2020-01-24 MED ORDER — MIDAZOLAM HCL 2 MG/2ML IJ SOLN
INTRAMUSCULAR | Status: DC | PRN
  Administered 2020-01-24 (×2): 1 mg via INTRAVENOUS

## 2020-01-24 MED ORDER — KETOROLAC TROMETHAMINE 15 MG/ML IJ SOLN
INTRAMUSCULAR | Status: AC
  Filled 2020-01-24: qty 1

## 2020-01-24 MED ORDER — HYDROCODONE-ACETAMINOPHEN 7.5-325 MG PO TABS
1.0000 | ORAL_TABLET | Freq: Once | ORAL | Status: DC | PRN
  Filled 2020-01-24: qty 1

## 2020-01-24 MED ORDER — CALCIUM CARBONATE ANTACID 500 MG PO CHEW
1250.0000 mg | CHEWABLE_TABLET | Freq: Every day | ORAL | Status: DC
  Administered 2020-01-24 – 2020-01-25 (×2): 1250 mg via ORAL
  Filled 2020-01-24 (×2): qty 7

## 2020-01-24 MED ORDER — ALBUTEROL SULFATE HFA 108 (90 BASE) MCG/ACT IN AERS
INHALATION_SPRAY | RESPIRATORY_TRACT | Status: DC | PRN
  Administered 2020-01-24 (×3): 2 via RESPIRATORY_TRACT

## 2020-01-24 MED ORDER — DROPERIDOL 2.5 MG/ML IJ SOLN
0.6250 mg | Freq: Once | INTRAMUSCULAR | Status: DC | PRN
  Filled 2020-01-24: qty 2

## 2020-01-24 SURGICAL SUPPLY — 71 items
APL PRP STRL LF DISP 70% ISPRP (MISCELLANEOUS) ×2
BAG DECANTER FOR FLEXI CONT (MISCELLANEOUS) IMPLANT
BLADE BOVIE TIP EXT 4 (BLADE) ×2 IMPLANT
BLADE SAW 1 (BLADE) ×2 IMPLANT
BNDG COHESIVE 6X5 TAN STRL LF (GAUZE/BANDAGES/DRESSINGS) ×2 IMPLANT
BNDG ELASTIC 6X5.8 VLCR STR LF (GAUZE/BANDAGES/DRESSINGS) ×2 IMPLANT
CANISTER SUCT 1200ML W/VALVE (MISCELLANEOUS) ×2 IMPLANT
CANISTER SUCT 3000ML PPV (MISCELLANEOUS) ×4 IMPLANT
CHLORAPREP W/TINT 26 (MISCELLANEOUS) ×4 IMPLANT
COVER BACK TABLE REUSABLE LG (DRAPES) ×2 IMPLANT
COVER WAND RF STERILE (DRAPES) ×2 IMPLANT
DRAPE 3/4 80X56 (DRAPES) ×2 IMPLANT
DRAPE INCISE IOBAN 66X60 STRL (DRAPES) ×2 IMPLANT
DRAPE U-SHAPE 47X51 STRL (DRAPES) ×2 IMPLANT
DRESSING AQUACEL AG SP 3.5X10 (GAUZE/BANDAGES/DRESSINGS) IMPLANT
DRSG AQUACEL AG ADV 3.5X10 (GAUZE/BANDAGES/DRESSINGS) ×2 IMPLANT
DRSG AQUACEL AG ADV 3.5X14 (GAUZE/BANDAGES/DRESSINGS) ×2 IMPLANT
DRSG AQUACEL AG SP 3.5X10 (GAUZE/BANDAGES/DRESSINGS) ×2
ELECT BLADE 6.5 EXT (BLADE) ×2 IMPLANT
ELECT CAUTERY BLADE 6.4 (BLADE) ×2 IMPLANT
ELECT REM PT RETURN 9FT ADLT (ELECTROSURGICAL) ×2
ELECTRODE REM PT RTRN 9FT ADLT (ELECTROSURGICAL) ×1 IMPLANT
GAUZE PACK 2X3YD (GAUZE/BANDAGES/DRESSINGS) IMPLANT
GAUZE XEROFORM 1X8 LF (GAUZE/BANDAGES/DRESSINGS) ×2 IMPLANT
GAUZE XEROFORM 4X4 STRL (GAUZE/BANDAGES/DRESSINGS) ×2 IMPLANT
GLOVE INDICATOR 8.0 STRL GRN (GLOVE) ×2 IMPLANT
GLOVE SURG ORTHO 8.0 STRL STRW (GLOVE) ×4 IMPLANT
GOWN STRL REUS W/ TWL LRG LVL3 (GOWN DISPOSABLE) ×1 IMPLANT
GOWN STRL REUS W/ TWL XL LVL3 (GOWN DISPOSABLE) ×1 IMPLANT
GOWN STRL REUS W/TWL LRG LVL3 (GOWN DISPOSABLE) ×2
GOWN STRL REUS W/TWL XL LVL3 (GOWN DISPOSABLE) ×2
HEAD CERAMIC DELTA 28M 12/14P5 (Head) ×1 IMPLANT
HEMOVAC 400CC 10FR (MISCELLANEOUS) IMPLANT
HOLDER FOLEY CATH W/STRAP (MISCELLANEOUS) ×2 IMPLANT
HOOD PEEL AWAY FLYTE STAYCOOL (MISCELLANEOUS) ×4 IMPLANT
IV NS 100ML SINGLE PACK (IV SOLUTION) IMPLANT
KIT TURNOVER KIT A (KITS) ×2 IMPLANT
LINER ACETAB BM 51X28 (Liner) ×2 IMPLANT
MAT ABSORB  FLUID 56X50 GRAY (MISCELLANEOUS) ×2
MAT ABSORB FLUID 56X50 GRAY (MISCELLANEOUS) ×1 IMPLANT
NDL SAFETY ECLIPSE 18X1.5 (NEEDLE) ×1 IMPLANT
NDL SPNL 20GX3.5 QUINCKE YW (NEEDLE) IMPLANT
NEEDLE HYPO 18GX1.5 SHARP (NEEDLE) ×2
NEEDLE HYPO 22GX1.5 SAFETY (NEEDLE) ×2 IMPLANT
NEEDLE SPNL 20GX3.5 QUINCKE YW (NEEDLE) ×2 IMPLANT
NS IRRIG 1000ML POUR BTL (IV SOLUTION) ×2 IMPLANT
PACK HIP PROSTHESIS (MISCELLANEOUS) ×2 IMPLANT
PAD ARMBOARD 7.5X6 YLW CONV (MISCELLANEOUS) ×2 IMPLANT
PILLOW ABDUCTION FOAM SM (MISCELLANEOUS) ×2 IMPLANT
PILLOW ABDUCTION MEDIUM (MISCELLANEOUS) ×2 IMPLANT
PRESSURIZER CEMENT PROX FEM SM (MISCELLANEOUS) IMPLANT
PRESSURIZER FEM CANAL M (MISCELLANEOUS) IMPLANT
PULSAVAC PLUS IRRIG FAN TIP (DISPOSABLE) ×2
RETRIEVER SUT HEWSON (MISCELLANEOUS) ×2 IMPLANT
SOL .9 NS 3000ML IRR  AL (IV SOLUTION) ×2
SOL .9 NS 3000ML IRR AL (IV SOLUTION) ×1
SOL .9 NS 3000ML IRR UROMATIC (IV SOLUTION) ×1 IMPLANT
STAPLER SKIN PROX 35W (STAPLE) ×2 IMPLANT
SUT DVC 2 QUILL PDO  T11 36X36 (SUTURE) ×2
SUT DVC 2 QUILL PDO T11 36X36 (SUTURE) IMPLANT
SUT ETHIBOND #5 BRAIDED 30INL (SUTURE) ×2 IMPLANT
SUT MERSILENE 5MM BP 1 12 (SUTURE) ×4 IMPLANT
SUT QUILL PDO 0 36 36 VIOLET (SUTURE) ×1 IMPLANT
SUT VIC AB 2-0 CT1 18 (SUTURE) ×4 IMPLANT
SUT VIC AB 2-0 CT1 27 (SUTURE) ×2
SUT VIC AB 2-0 CT1 TAPERPNT 27 (SUTURE) ×1 IMPLANT
SYR 20ML LL LF (SYRINGE) ×2 IMPLANT
SYR TB 1ML 27GX1/2 LL (SYRINGE) IMPLANT
TIP BRUSH PULSAVAC PLUS 24.33 (MISCELLANEOUS) ×2 IMPLANT
TIP FAN IRRIG PULSAVAC PLUS (DISPOSABLE) ×1 IMPLANT
TOWER CARTRIDGE SMART MIX (DISPOSABLE) IMPLANT

## 2020-01-24 NOTE — Transfer of Care (Signed)
Immediate Anesthesia Transfer of Care Note  Patient: Diane Adkins  Procedure(s) Performed: TOTAL HIP REVISION (Left Hip)  Patient Location: PACU  Anesthesia Type:General  Level of Consciousness: drowsy and patient cooperative  Airway & Oxygen Therapy: Patient Spontanous Breathing and Patient connected to face mask oxygen  Post-op Assessment: Report given to RN and Post -op Vital signs reviewed and stable  Post vital signs: Reviewed and stable  Last Vitals:  Vitals Value Taken Time  BP 112/56 01/24/20 1428  Temp    Pulse 56 01/24/20 1434  Resp 15 01/24/20 1434  SpO2 100 % 01/24/20 1434  Vitals shown include unvalidated device data.  Last Pain:  Vitals:   01/24/20 1026  TempSrc: Oral  PainSc: 9          Complications: No complications documented.

## 2020-01-24 NOTE — OR Nursing (Signed)
72mm ASR Femoral Head and +8 12/14 spacer removed from left hip by Dr Harlow Mares

## 2020-01-24 NOTE — H&P (Signed)
The patient has been re-examined, and the chart reviewed, and there have been no interval changes to the documented history and physical.  Plan a left hip revision today.  Anesthesia is not consulted regarding a peripheral nerve block for post-operative pain.  The risks, benefits, and alternatives have been discussed at length, and the patient is willing to proceed.    

## 2020-01-24 NOTE — Op Note (Signed)
01/24/2020  2:08 PM  PATIENT:  Diane Adkins   MRN: [id number]  PRE-OPERATIVE DIAGNOSIS:  Metallosis, painful total hip, left   POST-OPERATIVE DIAGNOSIS: Same  Procedure: Left Total Hip Revision femoral component  Surgeon: Elyn Aquas. Harlow Mares, MD   Assist: Carlynn Spry, PA-C  Anesthesia: General  EBL: 50 mL   Specimens: None   Drains: None   Components used: A 45 mm ASR head and spacer was explanted. 28 mm +5 mm Biolox delta ceramic femoral head with BI-MENTUM PE liner 28/51   Description of the procedure in detail: After informed consent was obtained and the appropriate extremity marked in the pre-operative holding area, the patient was taken to the operating room and placed in the supine position on the fracture table. All pressure points were well padded and bilateral lower extremities were place in traction spars. The hip was prepped and draped in standard sterile fashion. A general anesthetic had been delivered by the anesthesia team. The skin and subcutaneous tissues were injected with a mixture of Marcaine with epinephrine for post-operative pain. A longitudinal incision approximately 10 cm in length was carried out from the anterior superior iliac spine to the greater trochanter. The tensor fascia was divided and blunt dissection was taken down to the level of the joint capsule. The lateral circumflex vessels were cauterized. Deep retractors were placed and a portion of the anterior capsule was excised. The scar tissue and was white in color without evidence of metallosis or pseudotumor. The femoral stem and acetabulum were well fixed. Given the lack of medial bone in the acetabulum and risk to urinary and neurovascular structures and well-fixed nature of the shell, a decision was made to proceed with removal of all metal-metal bearing surfaces. The 45 mm metal head was removed with a tamp and passed from the field. The trunion did not show evidence of corrosion. A 45 mm PE trial head  and 28 mm +5 mm trial was placed. The hip was relocated. The components were found to be in good position using fluoroscopy. The hip was dislocated and the trial components removed. The final components were impacted in to position and the hip relocated. The final components were again check with fluoroscopy and found to be in good position. Hemostasis was achieved with electrocautery. The deep capsule was injected with Marcaine and epinephrine. The wound was irrigated with bacitracin laced normal saline and the tensor fascia closed with #2 Quill suture. The subcutaneous tissues were closed with 2-0 vicryl and staples for the skin. A sterile dressing was applied and an abduction pillow. Patient tolerated the procedure well and there were no apparent complication. Patient was taken to the recovery room in good condition.   Kurtis Bushman, MD

## 2020-01-24 NOTE — H&P (Signed)
PREOPERATIVE H&P  Chief Complaint: [phone] Presence of left artificial hip joint  HPI: Diane Adkins is a 74 y.o. female who presents for preoperative history and physical with a diagnosis of [phone] Presence of left artificial hip joint. Symptoms are rated as moderate to severe, and have been worsening.  This is significantly impairing activities of daily living.  She has elected for surgical management.   Past Medical History:  Diagnosis Date  . Arthritis    ra  . Cardiomegaly   . Cataract   . COPD (chronic obstructive pulmonary disease) ([place])   . Coronary artery disease   . Depression   . Diverticulitis   . Epilepsy ([place])   . Fibromyalgia   . GERD (gastroesophageal reflux disease)   . HOH (hard of hearing)    aids  . Hypertension   . Hypothyroidism   . Inflammatory bowel disease   . Lupus ([place])   . 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 MRSA (methicillin resistant staph aureus) culture positive 1990's  . Opioid dependence ([place])   . Pain    chronic back and neck  . Seizures ([place])    last 2016  . Spinal disorder    stenosis  . Thyroid disease    hypothyroid   Past Surgical History:  Procedure Laterality Date  . ABDOMINAL HYSTERECTOMY    . BACK SURGERY     [age]   cervical fusion/thoracic/lumbar with ray cages  . BREAST SURGERY     implants  . cardiac stents  2013   x 2  . CATARACT EXTRACTION W/PHACO Left 03/30/2016   Procedure: CATARACT EXTRACTION PHACO AND INTRAOCULAR LENS PLACEMENT (IOC);  Surgeon: Birder Robson, MD;  Location: [facility] ORS;  Service: Ophthalmology;  Laterality: Left;  [place] 00:45 AP% 19.6 CDE 8.93 Fluid pack lot # [id number]  . CATARACT EXTRACTION W/PHACO Right 04/27/2016   Procedure: CATARACT EXTRACTION PHACO AND INTRAOCULAR LENS PLACEMENT (IOC);  Surgeon: Birder Robson, MD;  Location: [facility] ORS;  Service: Ophthalmology;  Laterality: Right;  Lot #[id number] H [place]: 00:39.1 AP%: 17.3 CDE: 6.76  . CERVICAL SPINE SURGERY    . CORONARY ANGIOPLASTY      stent x2  . EYE SURGERY    . JOINT REPLACEMENT Left 2007   hip  . rotator cuff surg Right    Social History   Socioeconomic History  . Marital status: Divorced    Spouse name: Not on file  . Number of children: Not on file  . Years of education: Not on file  . Highest education level: Not on file  Occupational History  . Not on file  Tobacco Use  . Smoking status: Former Smoker    Packs/day: 0.50    Quit date: 02/10/2016    Years since quitting: 3.9  . Smokeless tobacco: Never Used  Vaping Use  . Vaping Use: Never used  Substance and Sexual Activity  . Alcohol use: No  . Drug use: No  . Sexual activity: Yes    Birth control/protection: Post-menopausal  Other Topics Concern  . Not on file  Social History Narrative  . Not on file   Social Determinants of Health   Financial Resource Strain:   . Difficulty of Paying Living Expenses:   Food Insecurity:   . Worried About Charity fundraiser in the Last Year:   . Arboriculturist in the Last Year:   Transportation Needs:   . Film/video editor (Medical):   Marland Kitchen Lack of Transportation (Non-Medical):   Physical Activity:   .  Days of Exercise per Week:   . Minutes of Exercise per Session:   Stress:   . Feeling of Stress :   Social Connections:   . Frequency of Communication with Friends and Family:   . Frequency of Social Gatherings with Friends and Family:   . Attends Religious Services:   . Active Member of Clubs or Organizations:   . Attends Archivist Meetings:   Marland Kitchen Marital Status:    Family History  Problem Relation Age of Onset  . Heart disease Mother   . Cancer Father    Allergies  Allergen Reactions  . Keflex [Cephalexin] Other (See Comments) and Hives    Reaction: unknown  . Beef-Derived Products     malaise  . Citrus     Orange juice/vomiting  . Lamotrigine Itching and Swelling  . Macrodantin [Nitrofurantoin Macrocrystal] Other (See Comments)    Reaction: unknown  . Nitrofurantoin Hives   . Statins Hives  . Fosamax [Alendronate Sodium] Hives   Prior to Admission medications   Medication Sig Start Date End Date Taking? Authorizing Provider  albuterol (VENTOLIN HFA) 108 (90 Base) MCG/ACT inhaler Inhale 2 puffs into the lungs every 4 (four) hours as needed for wheezing or shortness of breath.    Yes [provider]  calcium carbonate 1250 MG capsule Take 1,250 mg by mouth daily.    Yes [provider]  gabapentin (NEURONTIN) 400 MG capsule Take 400 mg by mouth 2 (two) times daily as needed (pain).   Yes [provider]  levETIRAcetam (KEPPRA) 500 MG tablet Take 500 mg by mouth 2 (two) times daily.  02/03/19  Yes [provider]  levothyroxine (SYNTHROID) 75 MCG tablet Take 75 mcg by mouth daily before breakfast. Brand Name Only   Yes [provider]  methocarbamol (ROBAXIN) 500 MG tablet Take 500 mg by mouth in the morning and at bedtime.   Yes [provider]  metoprolol tartrate (LOPRESSOR) 25 MG tablet Take 25 mg by mouth 2 (two) times daily.   Yes [provider]  omeprazole (PRILOSEC) 20 MG capsule Take 20 mg by mouth daily as needed (acid reflux).    Yes [provider]  oxyCODONE (ROXICODONE) 15 MG immediate release tablet Take 15 mg by mouth in the morning, at noon, in the evening, and at bedtime.  11/22/19  Yes [provider]  mupirocin ointment (BACTROBAN) 2 % Apply 1 application topically 2 (two) times daily. Patient not taking: Reported on 01/24/2020 01/23/20   Brendolyn Patty, MD     Positive ROS: All other systems have been reviewed and were otherwise negative with the exception of those mentioned in the HPI and as above.  Physical Exam: General: Alert, no acute distress Cardiovascular: Regular rate and rhythm, no murmurs rubs or gallops.  No pedal edema Respiratory: Clear to auscultation bilaterally, no wheezes rales or rhonchi. No cyanosis, no use of accessory musculature GI: No  organomegaly, abdomen is soft and non-tender nondistended with positive bowel sounds. Skin: Skin intact, no lesions within the operative field. Neurologic: Sensation intact distally Psychiatric: Patient is competent for consent with normal mood and affect Lymphatic: No axillary or cervical lymphadenopathy  MUSCULOSKELETAL: left hip pain with IR/ER, tender over trochanter  Assessment: Z00.174 Presence of left artificial hip joint with pain Metallosis  Plan: Plan for Procedure(s): TOTAL HIP REVISION  I discussed the risks and benefits of surgery. The risks include but are not limited to infection, bleeding requiring blood transfusion, nerve or blood  vessel injury, joint stiffness or loss of motion, persistent pain, weakness or instability, malunion, nonunion and hardware failure and the need for further surgery. Medical risks include but are not limited to DVT and pulmonary embolism, myocardial infarction, stroke, pneumonia, respiratory failure and death. Patient understood these risks and wished to proceed.   Lovell Sheehan, MD   01/24/2020 11:52 AM

## 2020-01-24 NOTE — Anesthesia Procedure Notes (Signed)
Procedure Name: Intubation Performed by: Fletcher-Harrison, Kayle Passarelli, CRNA Pre-anesthesia Checklist: Patient identified, Emergency Drugs available, Suction available and Patient being monitored Patient Re-evaluated:Patient Re-evaluated prior to induction Oxygen Delivery Method: Circle system utilized Preoxygenation: Pre-oxygenation with 100% oxygen Induction Type: IV induction Ventilation: Mask ventilation without difficulty Laryngoscope Size: McGraph and 3 Grade View: Grade I Tube type: Oral Tube size: 6.5 mm Number of attempts: 1 Airway Equipment and Method: Stylet and Oral airway Placement Confirmation: ETT inserted through vocal cords under direct vision,  positive ETCO2,  breath sounds checked- equal and bilateral and CO2 detector Secured at: 21 cm Tube secured with: Tape Dental Injury: Teeth and Oropharynx as per pre-operative assessment        

## 2020-01-24 NOTE — Anesthesia Preprocedure Evaluation (Addendum)
Anesthesia Evaluation  Patient identified by MRN, date of birth, ID band Patient awake    Reviewed: Allergy & Precautions, H&P , NPO status , reviewed documented beta blocker date and time   Airway Mallampati: II  TM Distance: >3 FB Neck ROM: limited    Dental  (+) Edentulous Upper, Partial Lower, Chipped, Missing, Dental Advidsory Given   Pulmonary COPD, Patient abstained from smoking., former smoker,    Pulmonary exam normal        Cardiovascular hypertension, + CAD  Normal cardiovascular exam     Neuro/Psych Seizures -,  PSYCHIATRIC DISORDERS Depression  Neuromuscular disease    GI/Hepatic GERD  Medicated and Controlled,  Endo/Other  Hypothyroidism   Renal/GU      Musculoskeletal  (+) Arthritis , Rheumatoid disorders,  Fibromyalgia -  Abdominal   Peds  Hematology   Anesthesia Other Findings Past Medical History: No date: Arthritis     Comment:  ra No date: Cardiomegaly No date: Cataract No date: COPD (chronic obstructive pulmonary disease) (HCC) No date: Coronary artery disease No date: Depression No date: Diverticulitis No date: Epilepsy ([place]) No date: Fibromyalgia No date: GERD (gastroesophageal reflux disease) No date: HOH (hard of hearing)     Comment:  aids No date: Hypertension No date: Hypothyroidism No date: Inflammatory bowel disease No date: Lupus ([place]) 02/17/2016: 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[place]) 1990's: MRSA (methicillin resistant staph aureus) culture positive No date: Opioid dependence ([place]) No date: Pain     Comment:  chronic back and neck No date: Seizures ([place])     Comment:  last 2016 No date: Spinal disorder     Comment:  stenosis No date: Thyroid disease     Comment:  hypothyroid  Past Surgical History: No date: ABDOMINAL HYSTERECTOMY No date: BACK SURGERY     Comment:  [age]   cervical fusion/thoracic/lumbar with ray cages No date: BREAST SURGERY     Comment:  implants 2013:  cardiac stents     Comment:  x 2 03/30/2016: CATARACT EXTRACTION W/PHACO; Left     Comment:  Procedure: CATARACT EXTRACTION PHACO AND INTRAOCULAR               LENS PLACEMENT (IOC);  Surgeon: Birder Robson, MD;                Location: [facility] ORS;  Service: Ophthalmology;  Laterality:              Left;  [place] 00:45 AP% 19.6 CDE 8.93 Fluid pack lot #               [id number] 04/27/2016: CATARACT EXTRACTION W/PHACO; Right     Comment:  Procedure: CATARACT EXTRACTION PHACO AND INTRAOCULAR               LENS PLACEMENT (IOC);  Surgeon: Birder Robson, MD;                Location: [facility] ORS;  Service: Ophthalmology;  Laterality:              Right;  Lot #[id number] H [place]: 00:39.1 AP%: 17.3 CDE: 6.76 No date: CERVICAL SPINE SURGERY No date: CORONARY ANGIOPLASTY     Comment:  stent x2 No date: EYE SURGERY 2007: JOINT REPLACEMENT; Left     Comment:  hip No date: rotator cuff surg; Right     Reproductive/Obstetrics                            Anesthesia Physical  Anesthesia Plan  ASA: III  Anesthesia Plan: General   Post-op Pain Management:    Induction: Intravenous  PONV Risk Score and Plan: Treatment may vary due to age or medical condition, TIVA, Ondansetron and Midazolam  Airway Management Planned: Oral ETT  Additional Equipment:   Intra-op Plan:   Post-operative Plan: Extubation in OR  Informed Consent: I have reviewed the patients History and Physical, chart, labs and discussed the procedure including the risks, benefits and alternatives for the proposed anesthesia with the patient or authorized representative who has indicated his/her understanding and acceptance.     Dental Advisory Given  Plan Discussed with: CRNA  Anesthesia Plan Comments: (Pt refuses spinal, wishes to proceed with GA)      Anesthesia Quick Evaluation

## 2020-01-25 ENCOUNTER — Encounter: Payer: Self-pay | Admitting: Orthopedic Surgery

## 2020-01-25 DIAGNOSIS — T8489XA Other specified complication of internal orthopedic prosthetic devices, implants and grafts, initial encounter: Secondary | ICD-10-CM | POA: Diagnosis not present

## 2020-01-25 LAB — CBC
HCT: 36.6 % (ref 36.0–46.0)
Hemoglobin: 12.3 g/dL (ref 12.0–15.0)
MCH: 30.5 pg (ref 26.0–34.0)
MCHC: 33.6 g/dL (ref 30.0–36.0)
MCV: 90.8 fL (ref 80.0–100.0)
Platelets: 215 10*3/uL (ref 150–400)
RBC: 4.03 MIL/uL (ref 3.87–5.11)
RDW: 12.3 % (ref 11.5–15.5)
WBC: 8.4 10*3/uL (ref 4.0–10.5)
nRBC: 0 % (ref 0.0–0.2)

## 2020-01-25 LAB — BASIC METABOLIC PANEL
Anion gap: 6 (ref 5–15)
BUN: 17 mg/dL (ref 8–23)
CO2: 27 mmol/L (ref 22–32)
Calcium: 8.6 mg/dL — ABNORMAL LOW (ref 8.9–10.3)
Chloride: 106 mmol/L (ref 98–111)
Creatinine, Ser: 1.04 mg/dL — ABNORMAL HIGH (ref 0.44–1.00)
GFR calc Af Amer: >60 mL/min (ref >60)
GFR calc non Af Amer: 53 mL/min — ABNORMAL LOW (ref >60)
Glucose, Bld: 126 mg/dL — ABNORMAL HIGH (ref 70–99)
Potassium: 4.3 mmol/L (ref 3.5–5.1)
Sodium: 139 mmol/L (ref 135–145)

## 2020-01-25 MED ORDER — ASPIRIN 81 MG PO CHEW: 81.0000 mg | CHEWABLE_TABLET | Freq: Two times a day (BID) | ORAL | 0 refills | Status: DC

## 2020-01-25 NOTE — TOC Initial Note (Signed)
Transition of Care [facility]) - Initial/Assessment Note    Patient Details  Name: Diane Adkins MRN: [id number] Date of Birth: 1946-03-21  [facility]) CM/SW Contact:    Elease Hashimoto, LCSW Phone Number: 01/25/2020, 9:50 AM  Clinical Narrative:  Met with pt to discuss discharge needs. She has an old rw but needs a new one, will order her a youth rw due to her small stature. Also needs a 3 in 1, have ordered both via [employer]. Her friend-Margaret will be staying with her a few days to make sure she is doing well. Pt was independent prior to admission and hopes to be doing better once heals from this surgery. [facility] will provide HHPT. Will work on discharge needs.              Expected Discharge Plan: [place] Barriers to Discharge: No Barriers Identified   Patient Goals and CMS Choice Patient states their goals for this hospitalization and ongoing recovery are:: I hope to do well today in therapies CMS Medicare.gov Compare Post Acute Care list provided to:: Patient Choice offered to / list presented to : Patient  Expected Discharge Plan and Services Expected Discharge Plan: [place] In-house Referral: Clinical Social Work   Post Acute Care Choice: Home Health, Durable Medical Equipment Living arrangements for the past 2 months: [place]                 DME Arranged: 3-N-1, Walker rolling DME Agency: [employer] Date DME Agency Contacted: 01/25/20 Time DME Agency Contacted: [phone] Representative spoke with at DME Agency: [place]: PT [place] Agency: [facility] (now [facility]) Date [place]: 01/25/20 Time [place]: [phone] Representative spoke with at [place]: teresa  Prior Living Arrangements/Services Living arrangements for the past 2 months: [place] with:: Self Patient language and need for interpreter reviewed:: No Do you feel safe going back to the place where  you live?: Yes      Need for Family Participation in Patient Care: No (Comment) Care giver support system in place?: Yes (comment) Current home services: DME (old rw) Criminal Activity/Legal Involvement Pertinent to Current Situation/Hospitalization: No - Comment as needed  Activities of Daily Living Home Assistive Devices/Equipment: Eyeglasses, Dentures (specify type), Hearing aid, Blood pressure cuff, Cane (specify quad or straight), Walker (specify type), Wheelchair, Shower chair without back ADL Screening (condition at time of admission) Patient's cognitive ability adequate to safely complete daily activities?: Yes Is the patient deaf or have difficulty hearing?: Yes Does the patient have difficulty seeing, even when wearing glasses/contacts?: Yes Does the patient have difficulty concentrating, remembering, or making decisions?: No Patient able to express need for assistance with ADLs?: Yes Does the patient have difficulty dressing or bathing?: Yes Independently performs ADLs?: Yes (appropriate for developmental age) Does the patient have difficulty walking or climbing stairs?: Yes Weakness of Legs: Left Weakness of Arms/Hands: Both  Permission Sought/Granted Permission sought to share information with : Family Supports, Chartered certified accountant granted to share information with : Yes, Verbal Permission Granted  Share Information with NAME: Joycelyn Schmid  Permission granted to share info w AGENCY: [facility]  Permission granted to share info w Relationship: freind  Permission granted to share info w Contact Information: teresa  Emotional Assessment Appearance:: Appears stated age Attitude/Demeanor/Rapport: Gracious Affect (typically observed): Adaptable, Accepting Orientation: : Oriented to Self, Oriented to Place, Oriented to  Time, Oriented to  Situation   Psych Involvement: No (comment)  Admission diagnosis:  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/24/2020  . Lumbar facet syndrome (Bilateral) (R>L) 03/03/2017  . DDD (degenerative disc disease), lumbar 03/03/2017  . Chronic pain syndrome 01/25/2017  . Chronic thoracic spine pain 01/25/2017  . Numbness of lower extremity (B) 01/25/2017  . Long term current use of anticoagulant therapy 01/25/2017  . History of Helicobacter pylori infection 01/24/2017  . Shoulder pain 10/19/2016  . COPD (chronic obstructive pulmonary disease) ([place]) 02/17/2016  . Epilepsy ([place]) 02/17/2016  . H/O adenomatous polyp of colon 02/17/2016  . H/O infectious disease 02/17/2016  . Personal history of other infectious and parasitic diseases 02/17/2016  . HLD (hyperlipidemia) 02/17/2016  . Hypothyroidism 02/17/2016  . Degeneration macular 02/17/2016  . Arthritis, degenerative 02/17/2016  . Presence of stent in coronary artery 02/17/2016  . Long term prescription benzodiazepine use 02/17/2016  . Long term current use of opiate analgesic 02/17/2016  . Long term prescription opiate use 02/17/2016  . Opiate use 02/17/2016  . Encounter for therapeutic drug level monitoring 02/17/2016  . Encounter for pain management planning 02/17/2016  . Chronic neck pain (Secondary source of pain) (Bilateral) (R>L) 02/17/2016  . Chronic low back pain (Primary Source of Pain) (Bilateral) (R>L) 02/17/2016  . Chronic upper back pain [facility] source of pain) (Bilateral) (R>L) 02/17/2016  . Chronic Arm numbness (Bilateral) (R>L) 02/17/2016  . Bilateral numbness and tingling of arms and legs (R>L) 02/17/2016  . 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 History of left hip replacement (Depue recalled replacement) 02/17/2016  . MRSA (methicillin resistant Staphylococcus aureus) 02/17/2016    Class: History of  . Lumbar spondylosis 02/17/2016  . Cervical spondylosis 02/17/2016  . Altered mental  status 12/16/2015  . Dysphagia 01/16/2014  . Carpal tunnel syndrome (S/P Right side release) (Bilateral) (B) (R>L) 11/15/2013  . Arteriosclerosis of coronary artery 10/17/2013  . Acid reflux 10/17/2013  . BP (high blood pressure) 10/17/2013  . Seizure ([place]) 05/26/2009   PCP:  Casilda Carls, MD Pharmacy:   [id number] [id number]: [phone]: [phone] [place] [id number]: [phone]: [phone]    Social Determinants of Health (SDOH) Interventions    Readmission Risk Interventions No flowsheet data found.

## 2020-01-25 NOTE — TOC Transition Note (Signed)
Transition of Care [facility]) - CM/SW Discharge Note   Patient Details  Name: Diane Adkins MRN: [id number] Date of Birth: 1946-02-16  [facility]) CM/SW Contact:  Elease Hashimoto, LCSW Phone Number: 01/25/2020, 2:48 PM   Clinical Narrative:   Pt ready to go home today. Equipment delivered and home health set up via [place].     Final next level of care: [place] Barriers to Discharge: No Barriers Identified   Patient Goals and CMS Choice Patient states their goals for this hospitalization and ongoing recovery are:: I hope to do well today in therapies CMS Medicare.gov Compare Post Acute Care list provided to:: Patient Choice offered to / list presented to : Patient  Discharge Placement                Patient to be transferred to facility by: Friend via car Name of family member notified: Friend Patient and family notified of of transfer: 01/25/20  Discharge Plan and Services In-house Referral: Clinical Social Work   Post Acute Care Choice: Home Health, Durable Medical Equipment          DME Arranged: 3-N-1, Walker rolling DME Agency: [employer] Date DME Agency Contacted: 01/25/20 Time DME Agency Contacted: [phone] Representative spoke with at DME Agency: [place]: PT [place]: [facility] ([facility]) Date [place]: 01/25/20 Time [place]: [phone] Representative spoke with at [place]: teresa  Social Determinants of Health ([place]) Interventions     Readmission Risk Interventions No flowsheet data found.

## 2020-01-25 NOTE — Progress Notes (Signed)
AVS printed and gone over with patient, all questions answered. Pt educated on hip precautions at home and about scheduled medications. Pt left with home walker and bed side commode

## 2020-01-25 NOTE — Care Management CC44 (Signed)
Condition Code 44 Documentation Completed  Patient Details  Name: Diane Adkins MRN: [id number] Date of Birth: Dec 24, 1945   Condition Code 44 given:  Yes Patient signature on Condition Code 44 notice:  Yes Documentation of 2 MD's agreement:  Yes Code 44 added to claim:  Yes    Elease Hashimoto, LCSW 01/25/2020, 2:47 PM

## 2020-01-25 NOTE — Evaluation (Signed)
Physical Therapy Evaluation Patient Details Name: Diane Adkins MRN: [id number] DOB: [birth year] Today's Date: 01/25/2020   History of Present Illness  74 y/o female here with total hip revision.  Had original L total hip in 2007.  Clinical Impression  Pt indicating that she did not feel that she would have been able to do a lot, however she was very impressive with ability to participate with 200 ft of gait training, needing only minimal cuing for walker use/positioning and cues for cadence.  She was also able to participate very well with supine exercises showing functional and appropriate strength despite some pain with the effort.  Overall pt did very well for first PT session post revision.      Follow Up Recommendations Home health PT;Follow surgeon's recommendation for DC plan and follow-up therapies;Supervision/Assistance - 24 hour    Equipment Recommendations  Rolling walker with 5" wheels (youth height)    Recommendations for Other Services       Precautions / Restrictions Precautions Precautions: Anterior Hip;Fall Restrictions Weight Bearing Restrictions: Yes      Mobility  Bed Mobility Overal bed mobility: Modified Independent             General bed mobility comments: Pt was able to get to EOB with rail use, but no phyiscal assist  Transfers Overall transfer level: Modified independent Equipment used: Rolling walker (2 wheeled)             General transfer comment: Pt indicating some hesitation at getting up but ultimately rose w/o assist   Ambulation/Gait Ambulation/Gait assistance: Min guard Gait Distance (Feet): 200 Feet Assistive device: Rolling walker (2 wheeled)       General Gait Details: Pt with stooped, forward flexed posture but was able to do much more than she or this PT expected.  She had minimal fatigue, was able to maintain consistent and appropriate speed and ultimately circumambulated the nurses station w/o need for rest  break.  Stairs            Wheelchair Mobility    Modified Rankin (Stroke Patients Only)       Balance Overall balance assessment: Modified Independent;Mild deficits observed, not formally tested                                           Pertinent Vitals/Pain Pain Assessment: 0-10 Pain Score: 4  Pain Location: L hip    Home Living Family/patient expects to be discharged to:: Private residence Living Arrangements: Alone Available Help at Discharge: Family (reports friend is going to stay with her)   Home Access: Stairs to enter Entrance Stairs-Rails: None (reports she uses heavy chair for rail...) Entrance Stairs-Number of Steps: 2 Home Layout: One level Home Equipment: Cane - single point (apparently has old, worn out walker)      Prior Function Level of Independence: Independent with assistive device(s)               Hand Dominance        Extremity/Trunk Assessment   Upper Extremity Assessment Upper Extremity Assessment: Generalized weakness    Lower Extremity Assessment Lower Extremity Assessment: Generalized weakness    Cervical / Trunk Assessment Cervical / Trunk Assessment: Kyphotic (scoliotic)  Communication   Communication: HOH  Cognition Arousal/Alertness: Awake/alert Behavior During Therapy: WFL for tasks assessed/performed Overall Cognitive Status: Within Functional Limits for tasks assessed  General Comments      Exercises Total Joint Exercises Ankle Circles/Pumps: AROM;10 reps Quad Sets: Strengthening;10 reps Short Arc Quad: AROM;10 reps Heel Slides: Strengthening;5 reps (with resisted leg extensions) Hip ABduction/ADduction: Strengthening;10 reps Straight Leg Raises: AAROM;10 reps   Assessment/Plan    PT Assessment Patient needs continued PT services  PT Problem List Decreased strength;Decreased activity tolerance;Decreased range of motion;Decreased  balance;Decreased mobility;Decreased coordination;Decreased cognition;Decreased safety awareness;Decreased knowledge of use of DME;Pain;Cardiopulmonary status limiting activity       PT Treatment Interventions Stair training;Gait training;DME instruction;Functional mobility training;Therapeutic activities;Therapeutic exercise;Neuromuscular re-education;Balance training;Patient/family education    PT Goals (Current goals can be found in the Care Plan section)  Acute Rehab PT Goals Patient Stated Goal: go home PT Goal Formulation: With patient Time For Goal Achievement: 02/08/20 Potential to Achieve Goals: Fair    Frequency BID   Barriers to discharge        Co-evaluation               AM-PAC PT "6 Clicks" Mobility  Outcome Measure Help needed turning from your back to your side while in a flat bed without using bedrails?: A Little Help needed moving from lying on your back to sitting on the side of a flat bed without using bedrails?: A Little Help needed moving to and from a bed to a chair (including a wheelchair)?: A Little Help needed standing up from a chair using your arms (e.g., wheelchair or bedside chair)?: A Little Help needed to walk in hospital room?: A Little Help needed climbing 3-5 steps with a railing? : A Lot 6 Click Score: 17    End of Session Equipment Utilized During Treatment: Gait belt Activity Tolerance: Patient tolerated treatment well Patient left: with chair alarm set;with call bell/phone within reach Nurse Communication: Mobility status PT Visit Diagnosis: Muscle weakness (generalized) (M62.81);Difficulty in walking, not elsewhere classified (R26.2)    Time: 4481-8563 PT Time Calculation (min) (ACUTE ONLY): 33 min   Charges:   PT Evaluation $PT Eval Low Complexity: 1 Low PT Treatments $Gait Training: 8-22 mins $Therapeutic Exercise: 8-22 mins        Kreg Shropshire, DPT 01/25/2020, 1:19 PM

## 2020-01-25 NOTE — Discharge Instructions (Signed)

## 2020-01-25 NOTE — Discharge Summary (Signed)
Physician Discharge Summary  Patient ID: Diane Adkins MRN: [id number] DOB/AGE: May 23, 1946 74 y.o.  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 Arthritis    ra  . Cardiomegaly   . Cataract   . COPD (chronic obstructive pulmonary disease) ([place])   . Coronary artery disease   . Depression   . Diverticulitis   . Epilepsy ([place])   . Fibromyalgia   . GERD (gastroesophageal reflux disease)   . HOH (hard of hearing)    aids  . Hypertension   . Hypothyroidism   . Inflammatory bowel disease   . Lupus ([place])   . 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 MRSA (methicillin resistant staph aureus) culture positive 1990's  . Opioid dependence ([place])   . Pain    chronic back and neck  . Seizures ([place])    last 2016  . Spinal disorder    stenosis  . Thyroid disease    hypothyroid    Surgeries: Procedure(s): TOTAL HIP REVISION on 01/24/2020   Consultants (if any):   Discharged Condition: Improved  Hospital Course: Diane Adkins is an 74 y.o. female who was admitted 01/24/2020 with a diagnosis of  Z96.642 Presence of left artificial hip joint <principal problem not specified> and went to the operating room on 01/24/2020 and underwent the above named procedures.    She was given perioperative antibiotics:  Anti-infectives (From admission, onward)   Start     Dose/Rate Route Frequency Ordered Stop   01/24/20 1846  clindamycin (CLEOCIN) 600 MG/50ML IVPB       Note to Pharmacy: Diane Adkins, Diane Adkins   : cabinet override      01/24/20 1846 01/25/20 0659   01/24/20 1800  clindamycin (CLEOCIN) IVPB 600 mg        600 mg 100 mL/hr over 30 Minutes Intravenous Every 6 hours 01/24/20 1755 01/25/20 0043    01/24/20 1215  50,000 units bacitracin in 0.9% normal saline 250 mL irrigation  Status:  Discontinued          As needed 01/24/20 1304 01/24/20 1424   01/24/20 1032  clindamycin (CLEOCIN) 900 MG/50ML IVPB       Note to Pharmacy: Diane Adkins   : cabinet override      01/24/20 1032 01/24/20 1217   01/24/20 0600  clindamycin (CLEOCIN) IVPB 900 mg        900 mg 100 mL/hr over 30 Minutes Intravenous On call to O.R. 01/24/20 0136 01/24/20 1225    .  She was given sequential compression devices, early ambulation, and aspirin for DVT prophylaxis.  She benefited maximally from the hospital stay and there were no complications.    Recent vital signs:  Vitals:   01/25/20 0742 01/25/20 1145  BP: 116/69 (!) 132/58  Pulse: 72 70  Resp: 16 16  Temp: 98.6 F (37 C) 98.9 F (37.2 C)  SpO2: 92% 97%    Recent laboratory studies:  Lab Results  Component Value Date   HGB 12.3 01/25/2020   HGB 13.7 01/22/2020   HGB 13.8 12/13/2019   Lab Results  Component Value Date   WBC 8.4 01/25/2020   PLT 215 01/25/2020   Lab Results  Component Value Date   INR 1.0  01/22/2020   Lab Results  Component Value Date   NA 139 01/25/2020   K 4.3 01/25/2020   CL 106 01/25/2020   CO2 27 01/25/2020   BUN 17 01/25/2020   CREATININE 1.04 (H) 01/25/2020   GLUCOSE 126 (H) 01/25/2020    Discharge Medications:   Allergies as of 01/25/2020      Reactions   Keflex [cephalexin] Other (See Comments), Hives   Reaction: unknown   Beef-derived Products    malaise   Citrus    Orange juice/vomiting   Lamotrigine Itching, Swelling   Macrodantin [nitrofurantoin Macrocrystal] Other (See Comments)   Reaction: unknown   Nitrofurantoin Hives   Statins Hives   Fosamax [alendronate Sodium] Hives      Medication List    TAKE these medications   albuterol 108 (90 Base) MCG/ACT inhaler Commonly known as: VENTOLIN HFA Inhale 2 puffs into the lungs every 4 (four) hours as needed for wheezing or shortness of  breath.   aspirin 81 MG chewable tablet Chew 1 tablet (81 mg total) by mouth 2 (two) times daily.   calcium carbonate 1250 MG capsule Take 1,250 mg by mouth daily.   gabapentin 400 MG capsule Commonly known as: NEURONTIN Take 400 mg by mouth 2 (two) times daily as needed (pain).   levETIRAcetam 500 MG tablet Commonly known as: KEPPRA Take 500 mg by mouth 2 (two) times daily.   methocarbamol 500 MG tablet Commonly known as: ROBAXIN Take 500 mg by mouth in the morning and at bedtime.   metoprolol tartrate 25 MG tablet Commonly known as: LOPRESSOR Take 25 mg by mouth 2 (two) times daily.   mupirocin ointment 2 % Commonly known as: BACTROBAN Apply 1 application topically 2 (two) times daily.   omeprazole 20 MG capsule Commonly known as: PRILOSEC Take 20 mg by mouth daily as needed (acid reflux).   oxyCODONE 15 MG immediate release tablet Commonly known as: ROXICODONE Take 15 mg by mouth in the morning, at noon, in the evening, and at bedtime.   Synthroid 75 MCG tablet Generic drug: levothyroxine Take 75 mcg by mouth daily before breakfast. Brand Name Only            Durable Medical Equipment  (From admission, onward)         Start     Ordered   01/24/20 1756  DME Walker rolling  Once       Question:  Patient needs a walker to treat with the following condition  Answer:  History of revision of total replacement of left hip joint   01/24/20 1755   01/24/20 1756  DME 3 n 1  Once        01/24/20 1755   01/24/20 1756  DME Bedside commode  Once       Question:  Patient needs a bedside commode to treat with the following condition  Answer:  History of revision of total replacement of left hip joint   01/24/20 1755          Diagnostic Studies: DG HIP OPERATIVE UNILAT W OR W/O PELVIS LEFT  Result Date: 01/24/2020 CLINICAL DATA:  Left hip revision. EXAM: OPERATIVE LEFT HIP (WITH PELVIS IF PERFORMED) March 11, 2016 VIEWS TECHNIQUE: Fluoroscopic spot image(s) were  submitted for interpretation post-operatively. COMPARISON:  03/11/2016. FINDINGS: The patient has undergone revision of the left hip arthroplasty. The hardware appears intact where visualized. There is no definite evidence for a periprosthetic fracture. There are expected postsurgical changes. IMPRESSION: Status post revision  of the left hip arthroplasty. Electronically Signed   By: Constance Holster M.D.   On: 01/24/2020 15:28    Disposition: Discharge disposition: 01-Home or Self Care            Signed: Lovell Sheehan ,MD 01/25/2020, 1:24 PM

## 2020-01-25 NOTE — Plan of Care (Signed)

## 2020-01-28 ENCOUNTER — Telehealth: Payer: Self-pay

## 2020-01-28 NOTE — Telephone Encounter (Signed)
PC to pt. To discuss [employer] issue last week, while she was pt. At [facility].  Voice mail full; unable to leave a message.

## 2020-01-28 NOTE — Anesthesia Postprocedure Evaluation (Signed)
Anesthesia Post Note  Patient: Diane Adkins  Procedure(s) Performed: TOTAL HIP REVISION (Left Hip)  Patient location during evaluation: PACU Anesthesia Type: General Level of consciousness: awake and alert Pain management: pain level controlled Vital Signs Assessment: post-procedure vital signs reviewed and stable Respiratory status: spontaneous breathing, nonlabored ventilation, respiratory function stable and patient connected to nasal cannula oxygen Cardiovascular status: blood pressure returned to baseline and stable Postop Assessment: no apparent nausea or vomiting Anesthetic complications: no   No complications documented.   Last Vitals:  Vitals:   01/25/20 0742 01/25/20 1145  BP: 116/69 (!) 132/58  Pulse: 72 70  Resp: 16 16  Temp: 37 C 37.2 C  SpO2: 92% 97%    Last Pain:  Vitals:   01/25/20 1436  TempSrc:   PainSc: 8                  Lovinia Snare T Lavone Neri

## 2020-02-17 ENCOUNTER — Telehealth: Payer: Self-pay | Admitting: Urology

## 2020-02-17 NOTE — Telephone Encounter (Signed)
Please call Mrs. Diane Adkins and have her schedule an appointment for a recheck on her hematuria and her renal cyst.

## 2020-02-26 ENCOUNTER — Telehealth: Payer: Self-pay | Admitting: Urology

## 2020-02-26 NOTE — Telephone Encounter (Signed)
Pt. Left message on office Voicemail to R/S appt. On 02/27/20. Returned call however pt.'s voicemail box is full.

## 2020-02-26 NOTE — Progress Notes (Deleted)
02/27/2020 9:42 PM   Diane Adkins 1946-02-06 [id number]  Referring provider: Casilda Carls, [address],  [place] [address]  No chief complaint on file.   HPI: Diane Adkins is a 74 year old female with hematuria and renal cysts who presents today for following.  High risk hematuria Former smoker.  CTU 02/2019 no calcifications are identified within the collecting system of either kidney, along the course of either ureter, or within the lumen of the urinary bladder. Several small subcentimeter high attenuation lesions are noted in the left kidney, incompletely characterized on today's non-contrast CT examination, but statistically likely to represent proteinaceous/hemorrhagic cyst. Unenhanced appearance of the right kidney and bilateral adrenal glands is normal. Urinary bladder is nearly completely decompressed, but otherwise unremarkable in appearance.  Cysto with Dr. Bernardo Heater 02/2019 NED.  No reports of gross hematuria.  ***  PMH: Past Medical History:  Diagnosis Date  . Arthritis    ra  . Cardiomegaly   . Cataract   . COPD (chronic obstructive pulmonary disease) ([place])   . Coronary artery disease   . Depression   . Diverticulitis   . Epilepsy ([place])   . Fibromyalgia   . GERD (gastroesophageal reflux disease)   . HOH (hard of hearing)    aids  . Hypertension   . Hypothyroidism   . Inflammatory bowel disease   . Lupus ([place])   . 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 MRSA (methicillin resistant staph aureus) culture positive 1990's  . Opioid dependence ([place])   . Pain    chronic back and neck  . Seizures ([place])    last 2016  . Spinal disorder    stenosis  . Thyroid disease    hypothyroid    Surgical History: Past Surgical History:  Procedure Laterality Date  . ABDOMINAL HYSTERECTOMY    . BACK SURGERY     [age]   cervical fusion/thoracic/lumbar with ray cages  . BREAST SURGERY     implants  . cardiac stents  2013   x 2  . CATARACT EXTRACTION  W/PHACO Left 03/30/2016   Procedure: CATARACT EXTRACTION PHACO AND INTRAOCULAR LENS PLACEMENT (IOC);  Surgeon: Birder Robson, MD;  Location: [facility] ORS;  Service: Ophthalmology;  Laterality: Left;  [place] 00:45 AP% 19.6 CDE 8.93 Fluid pack lot # [id number]  . CATARACT EXTRACTION W/PHACO Right 04/27/2016   Procedure: CATARACT EXTRACTION PHACO AND INTRAOCULAR LENS PLACEMENT (IOC);  Surgeon: Birder Robson, MD;  Location: [facility] ORS;  Service: Ophthalmology;  Laterality: Right;  Lot #[id number] H [place]: 00:39.1 AP%: 17.3 CDE: 6.76  . CERVICAL SPINE SURGERY    . CORONARY ANGIOPLASTY     stent x2  . EYE SURGERY    . JOINT REPLACEMENT Left 2007   hip  . rotator cuff surg Right   . TOTAL HIP REVISION Left 01/24/2020   Procedure: TOTAL HIP REVISION;  Surgeon: Lovell Sheehan, MD;  Location: [facility] ORS;  Service: Orthopedics;  Laterality: Left;    Home Medications:  Allergies as of 02/27/2020      Reactions   Keflex [cephalexin] Other (See Comments), Hives   Reaction: unknown   Beef-derived Products    malaise   Citrus    Orange juice/vomiting   Lamotrigine Itching, Swelling   Macrodantin [nitrofurantoin Macrocrystal] Other (See Comments)   Reaction: unknown   Nitrofurantoin Hives   Statins Hives   Fosamax [alendronate Sodium] Hives      Medication List       Accurate as of February 26, 2020  9:42 PM. If you have any questions, ask your nurse or doctor.        albuterol 108 (90 Base) MCG/ACT inhaler Commonly known as: VENTOLIN HFA Inhale 2 puffs into the lungs every 4 (four) hours as needed for wheezing or shortness of breath.   aspirin 81 MG chewable tablet Chew 1 tablet (81 mg total) by mouth 2 (two) times daily.   calcium carbonate 1250 MG capsule Take 1,250 mg by mouth daily.   gabapentin 400 MG capsule Commonly known as: NEURONTIN Take 400 mg by mouth 2 (two) times daily as needed (pain).   levETIRAcetam 500 MG tablet Commonly known as: KEPPRA Take 500 mg by mouth 2 (two) times  daily.   methocarbamol 500 MG tablet Commonly known as: ROBAXIN Take 500 mg by mouth in the morning and at bedtime.   metoprolol tartrate 25 MG tablet Commonly known as: LOPRESSOR Take 25 mg by mouth 2 (two) times daily.   mupirocin ointment 2 % Commonly known as: BACTROBAN Apply 1 application topically 2 (two) times daily.   omeprazole 20 MG capsule Commonly known as: PRILOSEC Take 20 mg by mouth daily as needed (acid reflux).   oxyCODONE 15 MG immediate release tablet Commonly known as: ROXICODONE Take 15 mg by mouth in the morning, at noon, in the evening, and at bedtime.   Synthroid 75 MCG tablet Generic drug: levothyroxine Take 75 mcg by mouth daily before breakfast. Brand Name Only       Allergies:  Allergies  Allergen Reactions  . Keflex [Cephalexin] Other (See Comments) and Hives    Reaction: unknown  . Beef-Derived Products     malaise  . Citrus     Orange juice/vomiting  . Lamotrigine Itching and Swelling  . Macrodantin [Nitrofurantoin Macrocrystal] Other (See Comments)    Reaction: unknown  . Nitrofurantoin Hives  . Statins Hives  . Fosamax [Alendronate Sodium] Hives    Family History: Family History  Problem Relation Age of Onset  . Heart disease Mother   . Cancer Father     Social History:  reports that she quit smoking about 4 years ago. She smoked 0.50 packs per day. She has never used smokeless tobacco. She reports that she does not drink alcohol and does not use drugs.  ROS: For pertinent review of systems please refer to history of present illness  Physical Exam: There were no vitals taken for this visit.  Constitutional:  Well nourished. Alert and oriented, No acute distress. HEENT: [place] AT, moist mucus membranes.  Trachea midline, no masses. Cardiovascular: No clubbing, cyanosis, or edema. Respiratory: Normal respiratory effort, no increased work of breathing. GI: Abdomen is soft, non tender, non distended, no abdominal masses. Liver  and spleen not palpable.  No hernias appreciated.  Stool sample for occult testing is not indicated.   GU: No CVA tenderness.  No bladder fullness or masses.  *** external genitalia, *** pubic hair distribution, no lesions.  Normal urethral meatus, no lesions, no prolapse, no discharge.   No urethral masses, tenderness and/or tenderness. No bladder fullness, tenderness or masses. *** vagina mucosa, *** estrogen effect, no discharge, no lesions, *** pelvic support, *** cystocele and *** rectocele noted.  No cervical motion tenderness.  Uterus is freely mobile and non-fixed.  No adnexal/parametria masses or tenderness noted.  Anus and perineum are without rashes or lesions.   ***  Skin: No rashes, bruises or suspicious lesions. Lymph: No cervical or inguinal adenopathy. Neurologic: Grossly intact, no focal deficits,  moving all 4 extremities. Psychiatric: Normal mood and affect.   Laboratory Data: Lab Results  Component Value Date   WBC 8.4 01/25/2020   HGB 12.3 01/25/2020   HCT 36.6 01/25/2020   MCV 90.8 01/25/2020   PLT 215 01/25/2020    Lab Results  Component Value Date   CREATININE 1.04 (H) 01/25/2020    Lab Results  Component Value Date   AST 17 12/13/2019   Lab Results  Component Value Date   ALT 12 12/13/2019    Urinalysis *** I have reviewed the labs.   Pertinent Imaging: No recent imaging  I have independently reviewed the films.    Assessment & Plan:    1. History of hematuria Hematuria work up completed in 02/2019 - findings positive for renal cysts No report of gross hematuria *** UA today *** RTC in one year for UA - patient to report any gross hematuria in the interim       2. Renal cysts Will obtain a RUS for surveillance                                      No follow-ups on file.  These notes generated with voice recognition software. I apologize for typographical errors.  Zara Council, PA-C  [facility] [address]  [place], [place] [address] [phone]

## 2020-02-27 ENCOUNTER — Encounter: Payer: Self-pay | Admitting: Urology

## 2020-02-27 ENCOUNTER — Ambulatory Visit: Payer: Medicare Other | Admitting: Urology

## 2020-02-27 DIAGNOSIS — R3129 Other microscopic hematuria: Secondary | ICD-10-CM

## 2020-02-27 DIAGNOSIS — N281 Cyst of kidney, acquired: Secondary | ICD-10-CM

## 2020-03-06 ENCOUNTER — Encounter: Payer: Self-pay | Admitting: Urology

## 2020-05-16 ENCOUNTER — Emergency Department
Admission: EM | Admit: 2020-05-16 | Discharge: 2020-05-16 | Disposition: A | Discharge status: Home / Self Care | Payer: Medicare Other | Attending: Emergency Medicine | Admitting: Emergency Medicine

## 2020-05-16 ENCOUNTER — Other Ambulatory Visit: Payer: Self-pay

## 2020-05-16 ENCOUNTER — Encounter: Payer: Self-pay | Admitting: Emergency Medicine

## 2020-05-16 DIAGNOSIS — Z5321 Procedure and treatment not carried out due to patient leaving prior to being seen by health care provider: Secondary | ICD-10-CM | POA: Insufficient documentation

## 2020-05-16 DIAGNOSIS — M549 Dorsalgia, unspecified: Secondary | ICD-10-CM | POA: Diagnosis not present

## 2020-05-16 DIAGNOSIS — R519 Headache, unspecified: Secondary | ICD-10-CM | POA: Insufficient documentation

## 2020-05-16 NOTE — ED Triage Notes (Addendum)
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

## 2020-07-15 ENCOUNTER — Telehealth: Payer: Self-pay | Admitting: Urology

## 2020-07-15 NOTE — Telephone Encounter (Signed)
Pt. Left message stating she was in a lot of pain and needed an appointment as soon as possible. I returned call, no answer and pt.'s Voicemail box is full.

## 2020-07-21 NOTE — Progress Notes (Deleted)
07/22/2020 2:13 PM   Diane Adkins [birth year] [id number]  Referring provider: Casilda Adkins, Diane Adkins,  [place] [address]  No chief complaint on file.  Urological history 1. High risk hematuria - Former smoker - work up with CTU and cystoscopy in 02/2019 - NED - previous work up in the 1980's NED per patient and 2009 with Dr. Jacqlyn Larsen - NED - UA ***  HPI: Diane Adkins is a 75 year old female who is referred to [place] by Dr. Rosario Adkins for blood in the urine.          PMH: Past Medical History:  Diagnosis Date  . Arthritis    ra  . Cardiomegaly   . Cataract   . COPD (chronic obstructive pulmonary disease) ([place])   . Coronary artery disease   . Depression   . Diverticulitis   . Epilepsy ([place])   . Fibromyalgia   . GERD (gastroesophageal reflux disease)   . HOH (hard of hearing)    aids  . Hypertension   . Hypothyroidism   . Inflammatory bowel disease   . Lupus ([place])   . 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 MRSA (methicillin resistant staph aureus) culture positive 1990's  . Opioid dependence ([place])   . Pain    chronic back and neck  . Seizures ([place])    last 2016  . Spinal disorder    stenosis  . Thyroid disease    hypothyroid    Surgical History: Past Surgical History:  Procedure Laterality Date  . ABDOMINAL HYSTERECTOMY    . BACK SURGERY     seven   cervical fusion/thoracic/lumbar with ray cages  . BREAST SURGERY     implants  . cardiac stents  2013   x 2  . CATARACT EXTRACTION W/PHACO Left 03/30/2016   Procedure: CATARACT EXTRACTION PHACO AND INTRAOCULAR LENS PLACEMENT (IOC);  Surgeon: Birder Robson, MD;  Location: [facility] ORS;  Service: Ophthalmology;  Laterality: Left;  [place] 00:45 AP% 19.6 CDE 8.93 Fluid pack lot # [id number]  . CATARACT EXTRACTION W/PHACO Right 04/27/2016   Procedure: CATARACT EXTRACTION PHACO AND INTRAOCULAR LENS PLACEMENT (IOC);  Surgeon: Birder Robson, MD;  Location: [facility] OR[facility];  Service: Ophthalmology;  Laterality:  Right;  Lot [id number] H [place]: 00:39.1 AP%: 17.3 CDE: 6.76  . CERVICAL SPINE SURGERY    . CORONARY ANGIOPLASTY     stent x2  . EYE SURGERY    . JOINT REPLACEMENT Left 2007   hip  . rotator cuff surg Right   . TOTAL HIP REVISION Left 01/24/2020   Procedure: TOTAL HIP REVISION;  Surgeon: Lovell Sheehan, MD;  Location: [facility] ORS;  Service: Orthopedics;  Laterality: Left;    Home Medications:  Allergies as of 07/22/2020      Reactions   Keflex [cephalexin] Other (See Comments), Hives   Reaction: unknown   Beef-derived Products    malaise   Citrus    Orange juice/vomiting   Lamotrigine Itching, Swelling   Macrodantin [nitrofurantoin Macrocrystal] Other (See Comments)   Reaction: unknown   Nitrofurantoin Hives   Statins Hives   Fosamax [alendronate Sodium] Hives      Medication List       Accurate as of July 21, 2020  2:13 PM. If you have any questions, ask your nurse or doctor.        albuterol 108 (90 Base) MCG/ACT inhaler Commonly known as: VENTOLIN HFA Inhale 2 puffs into the lungs every 4 (four) hours as needed for wheezing or  shortness of breath.   aspirin 81 MG chewable tablet Chew 1 tablet (81 mg total) by mouth 2 (two) times daily.   calcium carbonate 1250 MG capsule Take 1,250 mg by mouth daily.   gabapentin 400 MG capsule Commonly known as: NEURONTIN Take 400 mg by mouth 2 (two) times daily as needed (pain).   levETIRAcetam 500 MG tablet Commonly known as: KEPPRA Take 500 mg by mouth 2 (two) times daily.   methocarbamol 500 MG tablet Commonly known as: ROBAXIN Take 500 mg by mouth in the morning and at bedtime.   metoprolol tartrate 25 MG tablet Commonly known as: LOPRESSOR Take 25 mg by mouth 2 (two) times daily.   mupirocin ointment 2 % Commonly known as: BACTROBAN Apply 1 application topically 2 (two) times daily.   omeprazole 20 MG capsule Commonly known as: PRILOSEC Take 20 mg by mouth daily as needed (acid reflux).   oxyCODONE 15 MG  immediate release tablet Commonly known as: ROXICODONE Take 15 mg by mouth in the morning, at noon, in the evening, and at bedtime.   Synthroid 75 MCG tablet Generic drug: levothyroxine Take 75 mcg by mouth daily before breakfast. Brand Name Only       Allergies:  Allergies  Allergen Reactions  . Keflex [Cephalexin] Other (See Comments) and Hives    Reaction: unknown  . Beef-Derived Products     malaise  . Citrus     Orange juice/vomiting  . Lamotrigine Itching and Swelling  . Macrodantin [Nitrofurantoin Macrocrystal] Other (See Comments)    Reaction: unknown  . Nitrofurantoin Hives  . Statins Hives  . Fosamax [Alendronate Sodium] Hives    Family History: Family History  Problem Relation Age of Onset  . Heart disease Mother   . Cancer Father     Social History:  reports that she quit smoking about 4 years ago. She smoked 0.50 packs per day. She has never used smokeless tobacco. She reports that she does not drink alcohol and does not use drugs.  Pertinent ROS in HPI  Physical Exam: There were no vitals taken for this visit.  Constitutional:  Well nourished. Alert and oriented, No acute distress. HEENT: [place] AT, moist mucus membranes.  Trachea midline, no masses. Cardiovascular: No clubbing, cyanosis, or edema. Respiratory: Normal respiratory effort, no increased work of breathing. GI: Abdomen is soft, non tender, non distended, no abdominal masses. Liver and spleen not palpable.  No hernias appreciated.  Stool sample for occult testing is not indicated.   GU: No CVA tenderness.  No bladder fullness or masses.  *** external genitalia, *** pubic hair distribution, no lesions.  Normal urethral meatus, no lesions, no prolapse, no discharge.   No urethral masses, tenderness and/or tenderness. No bladder fullness, tenderness or masses. *** vagina mucosa, *** estrogen effect, no discharge, no lesions, *** pelvic support, *** cystocele and *** rectocele noted.  No cervical motion  tenderness.  Uterus is freely mobile and non-fixed.  No adnexal/parametria masses or tenderness noted.  Anus and perineum are without rashes or lesions.   ***  Skin: No rashes, bruises or suspicious lesions. Lymph: No cervical or inguinal adenopathy. Neurologic: Grossly intact, no focal deficits, moving all 4 extremities. Psychiatric: Normal mood and affect.   Laboratory Data: Lab Results  Component Value Date   WBC 8.4 01/25/2020   HGB 12.3 01/25/2020   HCT 36.6 01/25/2020   MCV 90.8 01/25/2020   PLT 215 01/25/2020    Lab Results  Component Value Date   CREATININE  1.04 (H) 01/25/2020    Lab Results  Component Value Date   AST 17 12/13/2019   Lab Results  Component Value Date   ALT 12 12/13/2019   Specimen:  Blood  Ref Range & Units 11 d ago Comments  CCP Antibodies IgG/IgA - [facility] 0 - 19 units 5              Negative        <20              Weak positive   20 - 39              Moderate positive 40 - 59              Strong positive    >59  Resulting [place]    Narrative Performed by Franchot Mimes Performed at: [facility]  [address] [place], [place] [id number]  Lab Director: Rush Farmer MD, Phone: [phone] Specimen Collected: 07/10/20 2:02 PM Last Resulted: 07/15/20 5:36 AM  Received From: [place]  Result Received: 07/15/20 9:53 AM   Specimen:  Blood  Ref Range & Units 11 d ago  RA Latex Turbid. - [facility] <14.0 IU/mL <10.0   Resulting Agency  [facility]   Narrative Performed by Franchot Mimes Performed at: [facility]  [address] [place], [place] [id number]  Lab Director: Rush Farmer MD, Phone: [phone] Specimen Collected: 07/10/20 2:02 PM Last Resulted: 07/11/20 7:36 AM  Received From: [facility]  Result Received: 07/15/20 9:53 AM     Specimen:  Blood  Ref Range  & Units 11 d ago Comments  Antimyeloperoxidase (MPO) Abs - [facility] 0.0 - 9.0 U/mL <9.0    Antiproteinase 3 (PR-3) Abs - [facility] 0.0 - 3.5 U/mL <3.5    C-ANCA - [facility] Neg:<1:20 titer <1:20    P-ANCA - [facility] Neg:<1:20 titer <1:20  The presence of positive fluorescence exhibiting P-ANCA or C-ANCA  patterns alone is not specific for the diagnosis of Wegener's  Granulomatosis (WG) or microscopic polyangiitis. Decisions about  treatment should not be based solely on ANCA IFA results. The  International ANCA Group Consensus recommends follow up testing of  positive sera with both PR-3 and MPO-ANCA enzyme immunoassays. As  many as 5% serum samples are positive only by EIA.  Ref. AM J Clin Pathol 1999;111:507-513.  Atypical pANCA - [facility] Neg:<1:20 titer <1:20  The atypical pANCA pattern has been observed in a significant  percentage of patients with ulcerative colitis, primary sclerosing  cholangitis and autoimmune hepatitis.  [place]    Narrative Performed by Franchot Mimes Performed at: [facility]  [address] [place], [place] [id number]  Lab Director: Rush Farmer MD, Phone: [phone] Specimen Collected: 07/10/20 2:02 PM Last Resulted: 07/15/20 5:36 AM  Received From: [facility]  Result Received: 07/15/20 9:53 AM     Specimen:  Blood  Ref Range & Units 11 d ago  C Reactive Protein - [facility] 0 - 10 mg/L 3   [place]   Narrative Performed by Franchot Mimes Performed at: [facility]  [address] [place], [place] [id number]  Lab Director: Rush Farmer MD, Phone: [phone] Specimen Collected: 07/10/20 2:02 PM Last Resulted: 07/15/20 5:36 AM  Received From: [facility]  Result Received: 07/15/20 9:53 AM     Urinalysis Component     Latest Ref Rng &  Units 01/22/2020  Color, Urine     YELLOW YELLOW (A)  Appearance     CLEAR HAZY (A)   Specific Gravity, Urine     1.005 - 1.030 1.024  pH     5.0 - 8.0 5.0  Glucose, UA     NEGATIVE mg/dL NEGATIVE  Hgb urine dipstick     NEGATIVE SMALL (A)  Bilirubin Urine     NEGATIVE NEGATIVE  Ketones, ur     NEGATIVE mg/dL NEGATIVE  Protein     NEGATIVE mg/dL 30 (A)  Nitrite     NEGATIVE NEGATIVE  Leukocytes,UA     Negative   RBC / HPF     0 - 5 RBC/hpf 11-20  WBC, UA     0 - 5 WBC/hpf 0-5  Bacteria, UA     NONE SEEN NONE SEEN  Squamous Epithelial / LPF     0 - 5 0-5  Mucus        Hyaline Casts, UA        Leukocytes,Ua     NEGATIVE SMALL (A)  Specific Gravity, UA     1.005 - 1.030   pH, UA     5.0 - 7.5   Color, UA     Yellow   Appearance Ur     Clear   Protein,UA     Negative/Trace   Ketones, UA     Negative   RBC, UA     Negative   Bilirubin, UA     Negative   Urobilinogen, Ur     0.2 - 1.0 mg/dL   Nitrite, UA     Negative   Microscopic Examination        RBC     0 - 2 /hpf   Epithelial Cells (non renal)     0 - 10 /hpf   Renal Epithel, UA     None seen /hpf   I have reviewed the labs.  Pertinent Imaging:  Assessment & Plan:    1. High risk hematuria - work up in 02/2019 was NED ***                                 No follow-ups on file.  These notes generated with voice recognition software. I apologize for typographical errors.  Zara Council, PA-C  [facility] [address]  [place], [place] [address] [phone]

## 2020-07-22 ENCOUNTER — Ambulatory Visit: Payer: Medicare Other | Admitting: Urology

## 2020-07-22 DIAGNOSIS — R319 Hematuria, unspecified: Secondary | ICD-10-CM

## 2020-07-22 NOTE — Telephone Encounter (Signed)
Error

## 2020-07-22 NOTE — Progress Notes (Signed)
07/23/2020 8:26 PM   Diane Adkins 1946-04-14 [id number]  Referring provider: Casilda Carls, [place],  [place] [address]  Chief Complaint  Patient presents with  . Hematuria   Urological history 1. High risk hematuria - Former smoker - work up with CTU and cystoscopy in 02/2019 - NED - previous work up in the 1980's NED per patient and 2009 with Dr. Jacqlyn Larsen - NED - UA 3-10 RBC's  HPI: Diane Adkins is a 75 year old female who is referred to [place] by Diane Adkins for blood in the urine with her buddy Diane Adkins, who is the son of Diane Adkins's friend.    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  She states she is experiencing urinary frequency, urgency and nocturia.  She is also having episodes of urge incontinence.  She states her stream is very weak and she only urinates small amounts at a time.  She has been having episodes of gross hematuria.  She has also had a long history of intermittent super pubic pain and lower back pain.  She also states that she has intermittent episodes of dysuria.  Her UA today is positive for 3-10 RBC's.  PMH: Past Medical History:  Diagnosis Date  . Arthritis    ra  . Cardiomegaly   . Cataract   . COPD (chronic obstructive pulmonary disease) ([place])   . Coronary artery disease   . Depression   . Diverticulitis   . Epilepsy ([place])   . Fibromyalgia   . GERD (gastroesophageal reflux disease)   . HOH (hard of hearing)    aids  . Hypertension   . Hypothyroidism   . Inflammatory bowel disease   . Lupus ([place])   . 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 MRSA (methicillin resistant staph aureus) culture positive 1990's  . Opioid dependence ([place])   . Pain    chronic back and neck  . Seizures ([place])    last 2016  . Spinal disorder    stenosis  . Thyroid disease    hypothyroid     Surgical History: Past Surgical History:  Procedure Laterality Date  . ABDOMINAL HYSTERECTOMY    . BACK SURGERY     [age]   cervical fusion/thoracic/lumbar with ray cages  . BREAST SURGERY     implants  . cardiac stents  2013   x 2  . CATARACT EXTRACTION W/PHACO Left 03/30/2016   Procedure: CATARACT EXTRACTION PHACO AND INTRAOCULAR LENS PLACEMENT (IOC);  Surgeon: Birder Robson, MD;  Location: [facility] ORS;  Service: Ophthalmology;  Laterality: Left;  [place] 00:45 AP% 19.6 CDE 8.93 Fluid pack lot # [id number]  . CATARACT EXTRACTION W/PHACO Right 04/27/2016   Procedure: CATARACT EXTRACTION PHACO AND INTRAOCULAR LENS PLACEMENT (IOC);  Surgeon: Birder Robson, MD;  Location: [facility] ORS;  Service: Ophthalmology;  Laterality: Right;  Lot #[id number] H [place]: 00:39.1 AP%: 17.3 CDE: 6.76  . CERVICAL SPINE SURGERY    . CORONARY ANGIOPLASTY     stent x2  . EYE SURGERY    . JOINT REPLACEMENT Left 2007   hip  . rotator cuff surg Right   . TOTAL HIP REVISION Left 01/24/2020   Procedure: TOTAL HIP REVISION;  Surgeon: Lovell Sheehan, MD;  Location: [facility] ORS;  Service: Orthopedics;  Laterality: Left;    Home Medications:  Allergies as  of 07/23/2020      Reactions   Keflex [cephalexin] Other (See Comments), Hives   Reaction: unknown   Beef-derived Products    malaise   Citrus    Orange juice/vomiting   Lamotrigine Itching, Swelling   Macrodantin [nitrofurantoin Macrocrystal] Other (See Comments)   Reaction: unknown   Nitrofurantoin Hives   Statins Hives   Fosamax [alendronate Sodium] Hives      Medication List       Accurate as of July 23, 2020 11:59 PM. If you have any questions, ask your nurse or doctor.        STOP taking these medications   gabapentin 400 MG capsule Commonly known as: NEURONTIN Stopped by: Adylin Hankey, PA-C   mupirocin ointment 2 % Commonly known as: BACTROBAN Stopped by: Zara Council, PA-C     TAKE these medications   albuterol 108 (90 Base)  MCG/ACT inhaler Commonly known as: VENTOLIN HFA Inhale 2 puffs into the lungs every 4 (four) hours as needed for wheezing or shortness of breath.   aspirin 81 MG chewable tablet Chew 1 tablet (81 mg total) by mouth 2 (two) times daily.   calcium carbonate 1250 MG capsule Take 1,250 mg by mouth daily.   levETIRAcetam 500 MG tablet Commonly known as: KEPPRA Take 500 mg by mouth 2 (two) times daily.   levothyroxine 75 MCG tablet Commonly known as: SYNTHROID Take 75 mcg by mouth daily before breakfast. Brand Name Only   methocarbamol 500 MG tablet Commonly known as: ROBAXIN Take 500 mg by mouth in the morning and at bedtime.   metoprolol tartrate 25 MG tablet Commonly known as: LOPRESSOR Take 25 mg by mouth 2 (two) times daily.   omeprazole 20 MG capsule Commonly known as: PRILOSEC Take 20 mg by mouth daily as needed (acid reflux).   oxyCODONE 15 MG immediate release tablet Commonly known as: ROXICODONE Take 15 mg by mouth in the morning, at noon, in the evening, and at bedtime.       Allergies:  Allergies  Allergen Reactions  . Keflex [Cephalexin] Other (See Comments) and Hives    Reaction: unknown  . Beef-Derived Products     malaise  . Citrus     Orange juice/vomiting  . Lamotrigine Itching and Swelling  . Macrodantin [Nitrofurantoin Macrocrystal] Other (See Comments)    Reaction: unknown  . Nitrofurantoin Hives  . Statins Hives  . Fosamax [Alendronate Sodium] Hives    Family History: Family History  Problem Relation Age of Onset  . Heart disease Mother   . Cancer Father     Social History:  reports that she quit smoking about 4 years ago. She smoked 0.50 packs per day. She has never used smokeless tobacco. She reports that she does not drink alcohol and does not use drugs.  Pertinent ROS in HPI  Physical Exam: BP 124/68   Pulse 82   Temp 98 F (36.7 C)   Constitutional:  Well nourished. Alert and oriented, No acute distress. HEENT: [place] AT, mask in  place.  Trachea midline Cardiovascular: No clubbing, cyanosis, or edema. Respiratory: Normal respiratory effort, no increased work of breathing. Neurologic: Grossly intact, no focal deficits, moving all 4 extremities. Psychiatric: Normal mood and affect.   Laboratory Data: Lab Results  Component Value Date   WBC 8.4 01/25/2020   HGB 12.3 01/25/2020   HCT 36.6 01/25/2020   MCV 90.8 01/25/2020   PLT 215 01/25/2020    Lab Results  Component Value Date   CREATININE  1.04 (H) 01/25/2020    Lab Results  Component Value Date   AST 17 12/13/2019   Lab Results  Component Value Date   ALT 12 12/13/2019   Specimen:  Blood  Ref Range & Units 11 d ago Comments  CCP Antibodies IgG/IgA - [facility] 0 - 19 units 5              Negative        <20              Weak positive   20 - 39              Moderate positive 40 - 59              Strong positive    >59  Resulting [facility]    Narrative  Performed at: [address]  [address] [place], [id number]  Lab Director: Rush Farmer MD, Phone: [phone] Specimen Collected: 07/10/20 2:02 PM Last Resulted: 07/15/20 5:36 AM  Received From: [facility]  Result Received: 07/15/20 9:53 AM   Specimen:  Blood  Ref Range & Units 11 d ago  RA Latex Turbid. - [facility] <14.0 IU/mL <10.0   Resulting Agency  [facility]   Narrative  Performed at: [address]  [address] [place], [id number]  Lab Director: Rush Farmer MD, Phone: [phone] Specimen Collected: 07/10/20 2:02 PM Last Resulted: 07/11/20 7:36 AM  Received From: [place]  Result Received: 07/15/20 9:53 AM     Specimen:  Blood  Ref Range & Units 11 d ago Comments  Antimyeloperoxidase (MPO) Abs - [facility] 0.0 - 9.0 U/mL <9.0    Antiproteinase 3 (PR-3) Abs - [facility] 0.0 - 3.5 U/mL <3.5    C-ANCA -  [facility] Neg:<1:20 titer <1:20    P-ANCA - [facility] Neg:<1:20 titer <1:20  The presence of positive fluorescence exhibiting P-ANCA or C-ANCA  patterns alone is not specific for the diagnosis of Wegener's  Granulomatosis (WG) or microscopic polyangiitis. Decisions about  treatment should not be based solely on ANCA IFA results. The  International ANCA Group Consensus recommends follow up testing of  positive sera with both PR-3 and MPO-ANCA enzyme immunoassays. As  many as 5% serum samples are positive only by EIA.  Ref. AM J Clin Pathol 1999;111:507-513.  Atypical pANCA - [facility] Neg:<1:20 titer <1:20  The atypical pANCA pattern has been observed in a significant  percentage of patients with ulcerative colitis, primary sclerosing  cholangitis and autoimmune hepatitis.  Resulting Agency  [facility]    Narrative  Performed at: [address]  [address], [place], [id number]  Lab Director: Rush Farmer MD, Phone: [phone] Specimen Collected: 07/10/20 2:02 PM Last Resulted: 07/15/20 5:36 AM  Received From: [facility]  Result Received: 07/15/20 9:53 AM     Specimen:  Blood  Ref Range & Units 11 d ago  C Reactive Protein - [facility] 0 - 10 mg/L 3   Resulting Agency  [facility]   Narrative  Performed at: [address]  [address] [place], [id number]  Lab Director: Rush Farmer MD, Phone: [phone] Specimen Collected: 07/10/20 2:02 PM Last Resulted: 07/15/20 5:36 AM  Received From: [facility]  Result Received: 07/15/20 9:53 AM     Urinalysis Component     Latest Ref Rng & Units 01/22/2020  Color, Urine     YELLOW YELLOW (  A)  Appearance     CLEAR HAZY (A)  Specific Gravity, Urine     1.005 - 1.030 1.024  pH     5.0 - 8.0 5.0  Glucose, UA     NEGATIVE mg/dL NEGATIVE  Hgb urine dipstick     NEGATIVE SMALL (A)  Bilirubin Urine     NEGATIVE NEGATIVE  Ketones, ur      NEGATIVE mg/dL NEGATIVE  Protein     NEGATIVE mg/dL 30 (A)  Nitrite     NEGATIVE NEGATIVE  Leukocytes,UA     Negative   RBC / HPF     0 - 5 RBC/hpf 11-20  WBC, UA     0 - 5 WBC/hpf 0-5  Bacteria, UA     NONE SEEN NONE SEEN  Squamous Epithelial / LPF     0 - 5 0-5  Mucus        Hyaline Casts, UA        Leukocytes,Ua     NEGATIVE SMALL (A)  Specific Gravity, UA     1.005 - 1.030   pH, UA     5.0 - 7.5   Color, UA     Yellow   Appearance Ur     Clear   Protein,UA     Negative/Trace   Ketones, UA     Negative   RBC, UA     Negative   Bilirubin, UA     Negative   Urobilinogen, Ur     0.2 - 1.0 mg/dL   Nitrite, UA     Negative   Microscopic Examination        RBC     0 - 2 /hpf   Epithelial Cells (non renal)     0 - 10 /hpf   Renal Epithel, UA     None seen /hpf    Component     Latest Ref Rng & Units 07/23/2020  Specific Gravity, UA     1.005 - 1.030 1.025  pH, UA     5.0 - 7.5 5.5  Color, UA     Yellow Yellow  Appearance Ur     Clear Hazy (A)  Leukocytes,UA     Negative Negative  Protein,UA     Negative/Trace 2+ (A)  Glucose, UA     Negative Negative  Ketones, UA     Negative Negative  RBC, UA     Negative 2+ (A)  Bilirubin, UA     Negative Negative  Urobilinogen, Ur     0.2 - 1.0 mg/dL 0.2  Nitrite, UA     Negative Negative  Microscopic Examination      See below:   Component     Latest Ref Rng & Units 07/23/2020  WBC, UA     0 - 5 /hpf 0-5  RBC     0 - 2 /hpf 3-10 (A)  Epithelial Cells (non renal)     0 - 10 /hpf 0-10  Bacteria, UA     None seen/Few None seen   I have reviewed the labs.  Pertinent Imaging:  Assessment & Plan:    1. High risk hematuria - work up in 02/2019 was NED - UA positive for 3-10 RBC's - Urine sent for culture - Urine sent for cytology  - RUS pending   2. 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  These notes generated with voice recognition software. I apologize for typographical errors.  Zara Council, PA-C  [facility] [address]  [place], [place] [address] [phone]

## 2020-07-23 ENCOUNTER — Encounter: Payer: Self-pay | Admitting: Urology

## 2020-07-23 ENCOUNTER — Other Ambulatory Visit: Payer: Self-pay

## 2020-07-23 ENCOUNTER — Other Ambulatory Visit: Payer: Self-pay | Admitting: Urology

## 2020-07-23 ENCOUNTER — Ambulatory Visit (INDEPENDENT_AMBULATORY_CARE_PROVIDER_SITE_OTHER): Payer: Medicare Other | Admitting: Urology

## 2020-07-23 VITALS — BP 124/68 | HR 82 | Temp 98.0°F

## 2020-07-23 DIAGNOSIS — N3941 Urge incontinence: Secondary | ICD-10-CM | POA: Diagnosis not present

## 2020-07-23 DIAGNOSIS — R3129 Other microscopic hematuria: Secondary | ICD-10-CM | POA: Diagnosis not present

## 2020-07-23 NOTE — Patient Instructions (Addendum)
Follow up here in 2-3 weeks. We will call you to schedule your renal ultrasound.

## 2020-07-24 LAB — URINALYSIS, COMPLETE
Bilirubin, UA: NEGATIVE
Glucose, UA: NEGATIVE
Ketones, UA: NEGATIVE
Leukocytes,UA: NEGATIVE
Nitrite, UA: NEGATIVE
Specific Gravity, UA: 1.025 (ref 1.005–1.030)
Urobilinogen, Ur: 0.2 mg/dL (ref 0.2–1.0)
pH, UA: 5.5 (ref 5.0–7.5)

## 2020-07-24 LAB — CYTOLOGY - NON PAP

## 2020-07-24 LAB — MICROSCOPIC EXAMINATION: Bacteria, UA: NONE SEEN

## 2020-07-25 IMAGING — XA DG HIP (WITH PELVIS) OPERATIVE*L*
3 series · 3 of 3 positions shown · non-contrast
Comparison: 03/11/2016.

CLINICAL DATA: Left hip revision.

EXAM:
OPERATIVE LEFT HIP (WITH PELVIS IF PERFORMED) March 11, 2016 VIEWS
TECHNIQUE: Fluoroscopic spot image(s) were submitted for interpretation
post-operatively.

[Series 1: cont. · 1 of 1 slices shown (1 of 3)]
[im 1/1]
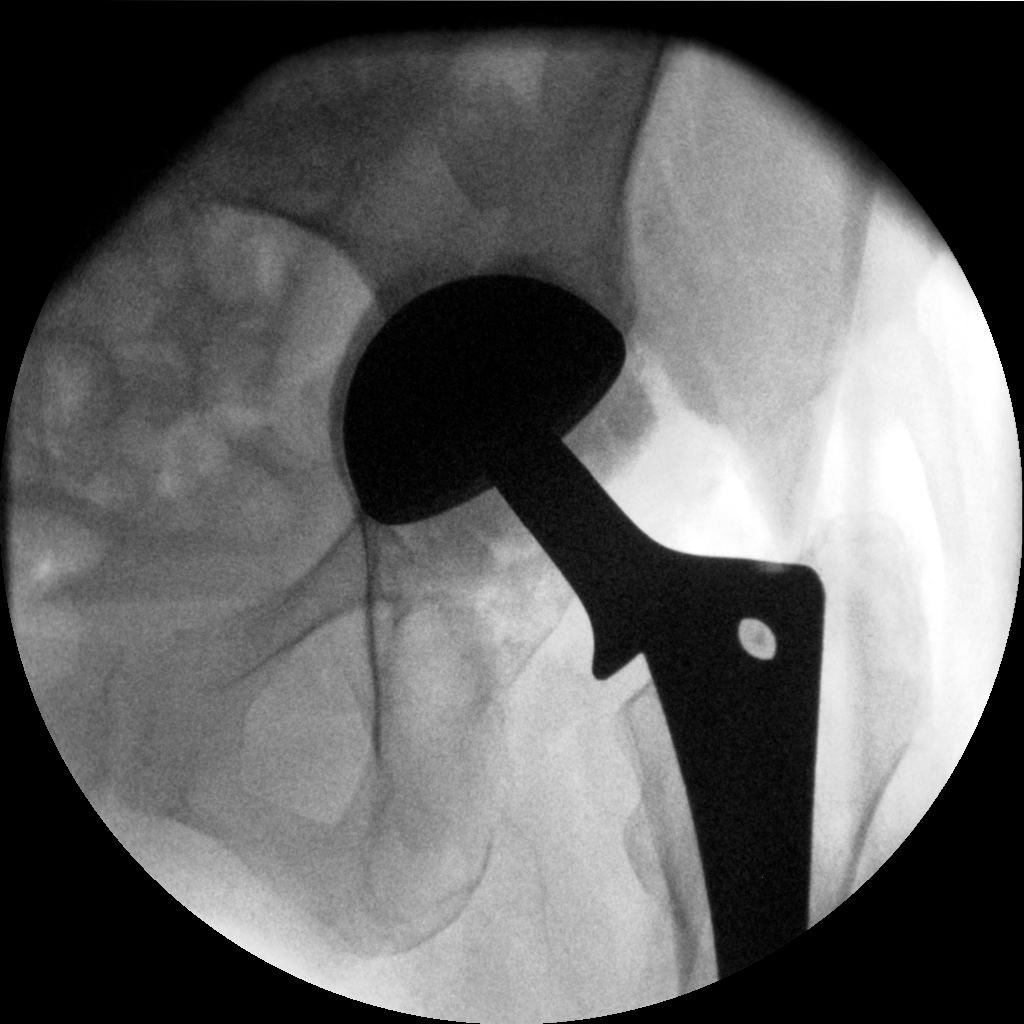

[Series 2: cont. · 1 of 1 slices shown (2 of 3)]
[im 1/1]
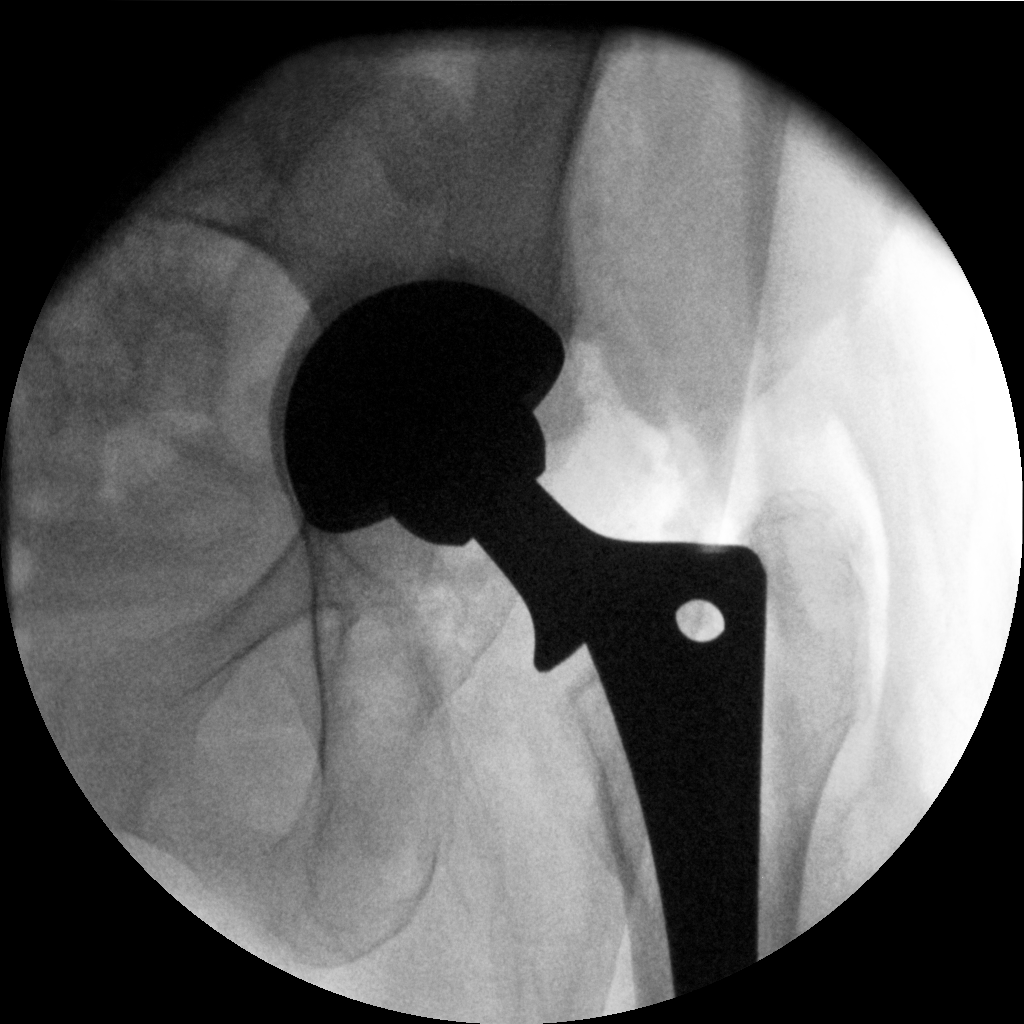

[Series 3: cont. · 1 of 1 slices shown (3 of 3)]
[im 1/1]
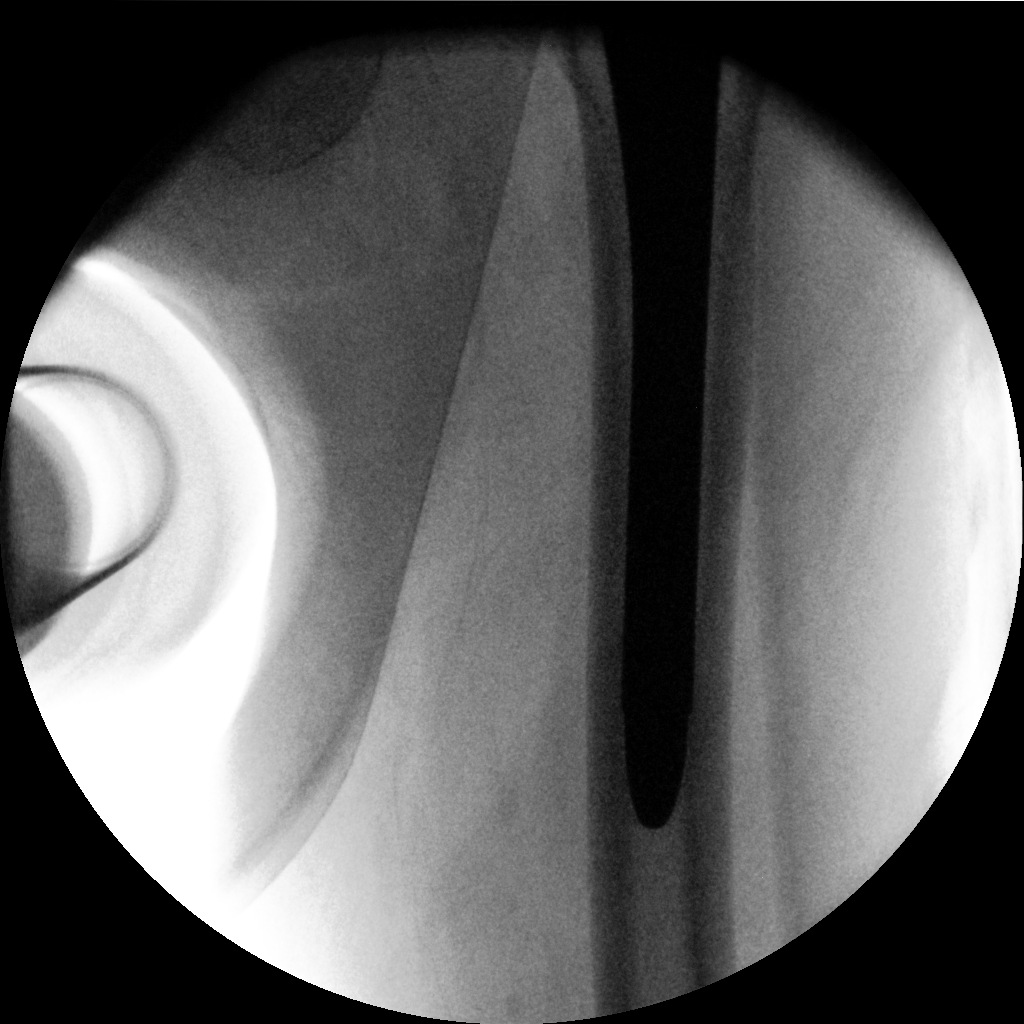

[3 of 3 positions shown; findings below may reference images not displayed]

FINDINGS: The patient has undergone revision of the left hip arthroplasty. The
hardware appears intact where visualized. There is no definite
evidence for a periprosthetic fracture. There are expected
postsurgical changes.
IMPRESSION: Status post revision of the left hip arthroplasty.

## 2020-07-28 ENCOUNTER — Ambulatory Visit: Payer: Medicare Other

## 2020-07-30 ENCOUNTER — Other Ambulatory Visit: Payer: Self-pay | Admitting: Urology

## 2020-07-30 ENCOUNTER — Telehealth: Payer: Self-pay | Admitting: Family Medicine

## 2020-07-30 LAB — CULTURE, URINE COMPREHENSIVE

## 2020-07-30 MED ORDER — CIPROFLOXACIN HCL 250 MG PO TABS: 250.0000 mg | ORAL_TABLET | Freq: Two times a day (BID) | ORAL | 0 refills | Status: DC

## 2020-07-30 NOTE — Telephone Encounter (Signed)
-----   Message from Nori Riis, PA-C sent at 07/30/2020 12:18 PM EST ----- Please let Mrs. Redd know that her urine culture came back positive for infection.  I have sent Cipro to 50 mg to be taken twice daily for 7 days to [facility] on [address] for her.

## 2020-07-30 NOTE — Telephone Encounter (Signed)
Patient notified and voiced understanding. She will pick up ABX

## 2020-08-06 ENCOUNTER — Ambulatory Visit: Payer: Medicare Other

## 2020-08-08 ENCOUNTER — Ambulatory Visit: Payer: Medicare Other | Admitting: Urology

## 2020-08-13 ENCOUNTER — Ambulatory Visit: Payer: Medicare Other | Attending: Urology

## 2020-08-13 ENCOUNTER — Telehealth: Payer: Self-pay

## 2020-08-13 NOTE — Telephone Encounter (Signed)
Radiology called to notify us that patient did not show for her RUS today. She does have an appointment with you tomorrow to review results. Would you like her to keep this appointment or reschedule?

## 2020-08-13 NOTE — Telephone Encounter (Signed)
Please have her reschedule her appointment.

## 2020-08-14 ENCOUNTER — Ambulatory Visit: Payer: Medicare Other | Admitting: Urology

## 2020-08-14 ENCOUNTER — Encounter: Payer: Self-pay | Admitting: Urology

## 2020-09-12 ENCOUNTER — Other Ambulatory Visit: Payer: Self-pay | Admitting: Radiology

## 2020-09-12 DIAGNOSIS — R3129 Other microscopic hematuria: Secondary | ICD-10-CM

## 2020-10-09 ENCOUNTER — Encounter: Payer: Self-pay | Admitting: Urology

## 2021-02-27 ENCOUNTER — Other Ambulatory Visit: Payer: Self-pay | Admitting: Infectious Diseases

## 2021-02-27 DIAGNOSIS — Z1231 Encounter for screening mammogram for malignant neoplasm of breast: Secondary | ICD-10-CM

## 2021-04-07 ENCOUNTER — Other Ambulatory Visit: Payer: Self-pay | Admitting: Infectious Diseases

## 2021-04-07 DIAGNOSIS — R634 Abnormal weight loss: Secondary | ICD-10-CM

## 2021-04-07 DIAGNOSIS — R1013 Epigastric pain: Secondary | ICD-10-CM

## 2021-05-06 ENCOUNTER — Other Ambulatory Visit (HOSPITAL_COMMUNITY): Payer: Self-pay | Admitting: Rheumatology

## 2021-05-06 ENCOUNTER — Other Ambulatory Visit: Payer: Self-pay | Admitting: Rheumatology

## 2021-05-06 DIAGNOSIS — M2550 Pain in unspecified joint: Secondary | ICD-10-CM

## 2021-05-06 DIAGNOSIS — M4306 Spondylolysis, lumbar region: Secondary | ICD-10-CM

## 2021-05-06 DIAGNOSIS — R700 Elevated erythrocyte sedimentation rate: Secondary | ICD-10-CM

## 2021-05-06 DIAGNOSIS — R7982 Elevated C-reactive protein (CRP): Secondary | ICD-10-CM

## 2021-05-06 DIAGNOSIS — R748 Abnormal levels of other serum enzymes: Secondary | ICD-10-CM

## 2021-05-13 ENCOUNTER — Other Ambulatory Visit: Payer: Medicare Other

## 2021-05-18 ENCOUNTER — Encounter
Admission: RE | Admit: 2021-05-18 | Discharge: 2021-05-18 | Disposition: A | Discharge status: Home / Self Care | Payer: Medicare Other | Source: Ambulatory Visit | Attending: Rheumatology | Admitting: Rheumatology

## 2021-05-18 ENCOUNTER — Emergency Department: Payer: Medicare Other

## 2021-05-18 ENCOUNTER — Other Ambulatory Visit: Payer: Self-pay

## 2021-05-18 DIAGNOSIS — J441 Chronic obstructive pulmonary disease with (acute) exacerbation: Secondary | ICD-10-CM | POA: Diagnosis not present

## 2021-05-18 DIAGNOSIS — M2550 Pain in unspecified joint: Secondary | ICD-10-CM | POA: Insufficient documentation

## 2021-05-18 DIAGNOSIS — R7982 Elevated C-reactive protein (CRP): Secondary | ICD-10-CM | POA: Insufficient documentation

## 2021-05-18 DIAGNOSIS — E669 Obesity, unspecified: Secondary | ICD-10-CM | POA: Diagnosis present

## 2021-05-18 DIAGNOSIS — Z681 Body mass index (BMI) 19 or less, adult: Secondary | ICD-10-CM

## 2021-05-18 DIAGNOSIS — J9621 Acute and chronic respiratory failure with hypoxia: Secondary | ICD-10-CM | POA: Diagnosis present

## 2021-05-18 DIAGNOSIS — Z8249 Family history of ischemic heart disease and other diseases of the circulatory system: Secondary | ICD-10-CM

## 2021-05-18 DIAGNOSIS — E785 Hyperlipidemia, unspecified: Secondary | ICD-10-CM | POA: Diagnosis present

## 2021-05-18 DIAGNOSIS — Z888 Allergy status to other drugs, medicaments and biological substances status: Secondary | ICD-10-CM

## 2021-05-18 DIAGNOSIS — Z7982 Long term (current) use of aspirin: Secondary | ICD-10-CM

## 2021-05-18 DIAGNOSIS — Z881 Allergy status to other antibiotic agents status: Secondary | ICD-10-CM

## 2021-05-18 DIAGNOSIS — E039 Hypothyroidism, unspecified: Secondary | ICD-10-CM | POA: Diagnosis present

## 2021-05-18 DIAGNOSIS — M797 Fibromyalgia: Secondary | ICD-10-CM | POA: Diagnosis present

## 2021-05-18 DIAGNOSIS — Z87891 Personal history of nicotine dependence: Secondary | ICD-10-CM

## 2021-05-18 DIAGNOSIS — I248 Other forms of acute ischemic heart disease: Secondary | ICD-10-CM | POA: Diagnosis present

## 2021-05-18 DIAGNOSIS — Z974 Presence of external hearing-aid: Secondary | ICD-10-CM

## 2021-05-18 DIAGNOSIS — M4306 Spondylolysis, lumbar region: Secondary | ICD-10-CM | POA: Insufficient documentation

## 2021-05-18 DIAGNOSIS — Z91018 Allergy to other foods: Secondary | ICD-10-CM

## 2021-05-18 DIAGNOSIS — N1831 Chronic kidney disease, stage 3a: Secondary | ICD-10-CM | POA: Diagnosis present

## 2021-05-18 DIAGNOSIS — Z79890 Hormone replacement therapy (postmenopausal): Secondary | ICD-10-CM

## 2021-05-18 DIAGNOSIS — Z79899 Other long term (current) drug therapy: Secondary | ICD-10-CM

## 2021-05-18 DIAGNOSIS — R700 Elevated erythrocyte sedimentation rate: Secondary | ICD-10-CM | POA: Insufficient documentation

## 2021-05-18 DIAGNOSIS — Z90710 Acquired absence of both cervix and uterus: Secondary | ICD-10-CM

## 2021-05-18 DIAGNOSIS — Z20822 Contact with and (suspected) exposure to covid-19: Secondary | ICD-10-CM | POA: Diagnosis present

## 2021-05-18 DIAGNOSIS — I2510 Atherosclerotic heart disease of native coronary artery without angina pectoris: Secondary | ICD-10-CM | POA: Diagnosis present

## 2021-05-18 DIAGNOSIS — G40909 Epilepsy, unspecified, not intractable, without status epilepticus: Secondary | ICD-10-CM | POA: Diagnosis present

## 2021-05-18 DIAGNOSIS — G894 Chronic pain syndrome: Secondary | ICD-10-CM | POA: Diagnosis present

## 2021-05-18 DIAGNOSIS — M069 Rheumatoid arthritis, unspecified: Secondary | ICD-10-CM | POA: Diagnosis present

## 2021-05-18 DIAGNOSIS — I129 Hypertensive chronic kidney disease with stage 1 through stage 4 chronic kidney disease, or unspecified chronic kidney disease: Secondary | ICD-10-CM | POA: Diagnosis present

## 2021-05-18 DIAGNOSIS — Z8614 Personal history of Methicillin resistant Staphylococcus aureus infection: Secondary | ICD-10-CM

## 2021-05-18 DIAGNOSIS — K219 Gastro-esophageal reflux disease without esophagitis: Secondary | ICD-10-CM | POA: Diagnosis present

## 2021-05-18 DIAGNOSIS — E440 Moderate protein-calorie malnutrition: Secondary | ICD-10-CM | POA: Diagnosis present

## 2021-05-18 DIAGNOSIS — I517 Cardiomegaly: Secondary | ICD-10-CM | POA: Diagnosis present

## 2021-05-18 DIAGNOSIS — H9193 Unspecified hearing loss, bilateral: Secondary | ICD-10-CM | POA: Diagnosis present

## 2021-05-18 DIAGNOSIS — Z955 Presence of coronary angioplasty implant and graft: Secondary | ICD-10-CM

## 2021-05-18 LAB — BASIC METABOLIC PANEL
Anion gap: 7 (ref 5–15)
BUN: 14 mg/dL (ref 8–23)
CO2: 32 mmol/L (ref 22–32)
Calcium: 9.3 mg/dL (ref 8.9–10.3)
Chloride: 100 mmol/L (ref 98–111)
Creatinine, Ser: 1.14 mg/dL — ABNORMAL HIGH (ref 0.44–1.00)
GFR, Estimated: 51 mL/min — ABNORMAL LOW (ref >60)
Glucose, Bld: 116 mg/dL — ABNORMAL HIGH (ref 70–99)
Potassium: 4 mmol/L (ref 3.5–5.1)
Sodium: 139 mmol/L (ref 135–145)

## 2021-05-18 LAB — CBC WITH DIFFERENTIAL/PLATELET
Abs Immature Granulocytes: 0.02 10*3/uL (ref 0.00–0.07)
Basophils Absolute: 0.1 10*3/uL (ref 0.0–0.1)
Basophils Relative: 1 %
Eosinophils Absolute: 0.3 10*3/uL (ref 0.0–0.5)
Eosinophils Relative: 7 %
HCT: 44.8 % (ref 36.0–46.0)
Hemoglobin: 14.9 g/dL (ref 12.0–15.0)
Immature Granulocytes: 0 %
Lymphocytes Relative: 28 %
Lymphs Abs: 1.3 10*3/uL (ref 0.7–4.0)
MCH: 29.7 pg (ref 26.0–34.0)
MCHC: 33.3 g/dL (ref 30.0–36.0)
MCV: 89.4 fL (ref 80.0–100.0)
Monocytes Absolute: 0.6 10*3/uL (ref 0.1–1.0)
Monocytes Relative: 12 %
Neutro Abs: 2.5 10*3/uL (ref 1.7–7.7)
Neutrophils Relative %: 52 %
Platelets: 382 10*3/uL (ref 150–400)
RBC: 5.01 MIL/uL (ref 3.87–5.11)
RDW: 13.2 % (ref 11.5–15.5)
WBC: 4.8 10*3/uL (ref 4.0–10.5)
nRBC: 0 % (ref 0.0–0.2)

## 2021-05-18 LAB — TROPONIN I (HIGH SENSITIVITY): Troponin I (High Sensitivity): 23 ng/L — ABNORMAL HIGH (ref <18)

## 2021-05-18 LAB — BRAIN NATRIURETIC PEPTIDE: B Natriuretic Peptide: 76.1 pg/mL (ref 0.0–100.0)

## 2021-05-18 MED ORDER — TECHNETIUM TC 99M MEDRONATE IV KIT
22.2800 | PACK | Freq: Once | INTRAVENOUS | Status: AC | PRN
  Administered 2021-05-18: 22.28 via INTRAVENOUS

## 2021-05-18 NOTE — ED Triage Notes (Signed)
Pt presents to ER c/o weakness, weight loss, and sob x several months.  Pt states tonight she became weaker.  No distress noted in triage at this time.  Pt denies any new dx at this time.  Pt states she had a bone scan today but has not received any results.

## 2021-05-19 ENCOUNTER — Inpatient Hospital Stay: Payer: Medicare Other

## 2021-05-19 ENCOUNTER — Inpatient Hospital Stay
Admission: EM | Admit: 2021-05-19 | Discharge: 2021-05-20 | DRG: 190 | Disposition: A | Discharge status: Home / Self Care | Payer: Medicare Other | Attending: Student in an Organized Health Care Education/Training Program | Admitting: Student in an Organized Health Care Education/Training Program

## 2021-05-19 ENCOUNTER — Encounter: Payer: Self-pay | Admitting: Internal Medicine

## 2021-05-19 DIAGNOSIS — E440 Moderate protein-calorie malnutrition: Secondary | ICD-10-CM

## 2021-05-19 DIAGNOSIS — J441 Chronic obstructive pulmonary disease with (acute) exacerbation: Secondary | ICD-10-CM | POA: Diagnosis present

## 2021-05-19 DIAGNOSIS — N183 Chronic kidney disease, stage 3 unspecified: Secondary | ICD-10-CM | POA: Diagnosis not present

## 2021-05-19 DIAGNOSIS — R569 Unspecified convulsions: Secondary | ICD-10-CM | POA: Diagnosis not present

## 2021-05-19 DIAGNOSIS — Z974 Presence of external hearing-aid: Secondary | ICD-10-CM | POA: Diagnosis not present

## 2021-05-19 DIAGNOSIS — E038 Other specified hypothyroidism: Secondary | ICD-10-CM | POA: Diagnosis not present

## 2021-05-19 DIAGNOSIS — E039 Hypothyroidism, unspecified: Secondary | ICD-10-CM | POA: Diagnosis present

## 2021-05-19 DIAGNOSIS — I248 Other forms of acute ischemic heart disease: Secondary | ICD-10-CM | POA: Diagnosis present

## 2021-05-19 DIAGNOSIS — I10 Essential (primary) hypertension: Secondary | ICD-10-CM | POA: Diagnosis not present

## 2021-05-19 DIAGNOSIS — M2550 Pain in unspecified joint: Secondary | ICD-10-CM | POA: Diagnosis present

## 2021-05-19 DIAGNOSIS — I129 Hypertensive chronic kidney disease with stage 1 through stage 4 chronic kidney disease, or unspecified chronic kidney disease: Secondary | ICD-10-CM | POA: Diagnosis present

## 2021-05-19 DIAGNOSIS — Z79899 Other long term (current) drug therapy: Secondary | ICD-10-CM | POA: Diagnosis not present

## 2021-05-19 DIAGNOSIS — Z8614 Personal history of Methicillin resistant Staphylococcus aureus infection: Secondary | ICD-10-CM | POA: Diagnosis not present

## 2021-05-19 DIAGNOSIS — N1831 Chronic kidney disease, stage 3a: Secondary | ICD-10-CM

## 2021-05-19 DIAGNOSIS — J9621 Acute and chronic respiratory failure with hypoxia: Secondary | ICD-10-CM | POA: Diagnosis present

## 2021-05-19 DIAGNOSIS — G40909 Epilepsy, unspecified, not intractable, without status epilepticus: Secondary | ICD-10-CM | POA: Diagnosis present

## 2021-05-19 DIAGNOSIS — E785 Hyperlipidemia, unspecified: Secondary | ICD-10-CM | POA: Diagnosis present

## 2021-05-19 DIAGNOSIS — F1190 Opioid use, unspecified, uncomplicated: Secondary | ICD-10-CM

## 2021-05-19 DIAGNOSIS — G894 Chronic pain syndrome: Secondary | ICD-10-CM | POA: Diagnosis present

## 2021-05-19 DIAGNOSIS — Z681 Body mass index (BMI) 19 or less, adult: Secondary | ICD-10-CM | POA: Diagnosis not present

## 2021-05-19 DIAGNOSIS — Z7982 Long term (current) use of aspirin: Secondary | ICD-10-CM | POA: Diagnosis not present

## 2021-05-19 DIAGNOSIS — I2510 Atherosclerotic heart disease of native coronary artery without angina pectoris: Secondary | ICD-10-CM | POA: Diagnosis present

## 2021-05-19 DIAGNOSIS — R531 Weakness: Secondary | ICD-10-CM

## 2021-05-19 DIAGNOSIS — K219 Gastro-esophageal reflux disease without esophagitis: Secondary | ICD-10-CM | POA: Diagnosis present

## 2021-05-19 DIAGNOSIS — M797 Fibromyalgia: Secondary | ICD-10-CM | POA: Diagnosis present

## 2021-05-19 DIAGNOSIS — I517 Cardiomegaly: Secondary | ICD-10-CM | POA: Diagnosis present

## 2021-05-19 DIAGNOSIS — J9601 Acute respiratory failure with hypoxia: Secondary | ICD-10-CM | POA: Diagnosis present

## 2021-05-19 DIAGNOSIS — Z79890 Hormone replacement therapy (postmenopausal): Secondary | ICD-10-CM | POA: Diagnosis not present

## 2021-05-19 DIAGNOSIS — M069 Rheumatoid arthritis, unspecified: Secondary | ICD-10-CM | POA: Diagnosis present

## 2021-05-19 DIAGNOSIS — E669 Obesity, unspecified: Secondary | ICD-10-CM | POA: Diagnosis present

## 2021-05-19 DIAGNOSIS — Z20822 Contact with and (suspected) exposure to covid-19: Secondary | ICD-10-CM | POA: Diagnosis present

## 2021-05-19 DIAGNOSIS — H9193 Unspecified hearing loss, bilateral: Secondary | ICD-10-CM | POA: Diagnosis present

## 2021-05-19 LAB — RESPIRATORY PANEL BY PCR

## 2021-05-19 LAB — URINALYSIS, ROUTINE W REFLEX MICROSCOPIC
Bilirubin Urine: NEGATIVE
Glucose, UA: NEGATIVE mg/dL
Hgb urine dipstick: NEGATIVE
Ketones, ur: 20 mg/dL — AB
Leukocytes,Ua: NEGATIVE
Nitrite: NEGATIVE
Protein, ur: NEGATIVE mg/dL
Specific Gravity, Urine: 1.025 (ref 1.005–1.030)
pH: 5 (ref 5.0–8.0)

## 2021-05-19 LAB — HEMOGLOBIN A1C
Hgb A1c MFr Bld: 5.7 % — ABNORMAL HIGH (ref 4.8–5.6)
Mean Plasma Glucose: 116.89 mg/dL

## 2021-05-19 LAB — TROPONIN I (HIGH SENSITIVITY)
Troponin I (High Sensitivity): 20 ng/L — ABNORMAL HIGH (ref <18)
Troponin I (High Sensitivity): 12 ng/L (ref <18)

## 2021-05-19 LAB — TSH: TSH: 1.789 u[IU]/mL (ref 0.350–4.500)

## 2021-05-19 LAB — RESP PANEL BY RT-PCR (FLU A&B, COVID) ARPGX2
Influenza A by PCR: NEGATIVE
Influenza B by PCR: NEGATIVE
SARS Coronavirus 2 by RT PCR: NEGATIVE

## 2021-05-19 LAB — MAGNESIUM: Magnesium: 2.1 mg/dL (ref 1.7–2.4)

## 2021-05-19 MED ORDER — IPRATROPIUM-ALBUTEROL 0.5-2.5 (3) MG/3ML IN SOLN
3.0000 mL | Freq: Once | RESPIRATORY_TRACT | Status: AC
  Administered 2021-05-19: 3 mL via RESPIRATORY_TRACT
  Filled 2021-05-19: qty 3

## 2021-05-19 MED ORDER — ENSURE ENLIVE PO LIQD
237.0000 mL | Freq: Two times a day (BID) | ORAL | Status: DC
Start: 2021-05-19 — End: 2021-05-20

## 2021-05-19 MED ORDER — DM-GUAIFENESIN ER 30-600 MG PO TB12: 1.0000 | ORAL_TABLET | Freq: Two times a day (BID) | ORAL | Status: DC | PRN

## 2021-05-19 MED ORDER — OXYCODONE HCL 5 MG PO TABS
15.0000 mg | ORAL_TABLET | Freq: Three times a day (TID) | ORAL | Status: DC | PRN
  Administered 2021-05-19 – 2021-05-20 (×3): 15 mg via ORAL
  Filled 2021-05-19 (×3): qty 3

## 2021-05-19 MED ORDER — LEVETIRACETAM 500 MG PO TABS
500.0000 mg | ORAL_TABLET | Freq: Two times a day (BID) | ORAL | Status: DC
  Administered 2021-05-19 – 2021-05-20 (×2): 500 mg via ORAL
  Filled 2021-05-19 (×2): qty 1

## 2021-05-19 MED ORDER — HYDRALAZINE HCL 20 MG/ML IJ SOLN: 5.0000 mg | INTRAMUSCULAR | Status: DC | PRN

## 2021-05-19 MED ORDER — IPRATROPIUM-ALBUTEROL 0.5-2.5 (3) MG/3ML IN SOLN
3.0000 mL | Freq: Once | RESPIRATORY_TRACT | Status: AC
  Administered 2021-05-19: 3 mL via RESPIRATORY_TRACT

## 2021-05-19 MED ORDER — LORAZEPAM 2 MG/ML IJ SOLN
1.0000 mg | INTRAMUSCULAR | Status: DC | PRN
  Administered 2021-05-19: 1 mg via INTRAVENOUS
  Filled 2021-05-19: qty 1

## 2021-05-19 MED ORDER — ONDANSETRON HCL 4 MG/2ML IJ SOLN: 4.0000 mg | Freq: Three times a day (TID) | INTRAMUSCULAR | Status: DC | PRN

## 2021-05-19 MED ORDER — PREDNISONE 20 MG PO TABS
60.0000 mg | ORAL_TABLET | Freq: Once | ORAL | Status: AC
  Administered 2021-05-19: 60 mg via ORAL
  Filled 2021-05-19: qty 3

## 2021-05-19 MED ORDER — LEVOTHYROXINE SODIUM 50 MCG PO TABS
75.0000 ug | ORAL_TABLET | Freq: Every day | ORAL | Status: DC
  Administered 2021-05-20: 75 ug via ORAL
  Filled 2021-05-19: qty 1

## 2021-05-19 MED ORDER — MAGNESIUM SULFATE 2 GM/50ML IV SOLN
2.0000 g | Freq: Once | INTRAVENOUS | Status: AC
  Administered 2021-05-19: 2 g via INTRAVENOUS
  Filled 2021-05-19: qty 50

## 2021-05-19 MED ORDER — ALBUTEROL SULFATE (2.5 MG/3ML) 0.083% IN NEBU: 2.5000 mg | INHALATION_SOLUTION | RESPIRATORY_TRACT | Status: DC | PRN

## 2021-05-19 MED ORDER — ENOXAPARIN SODIUM 40 MG/0.4ML IJ SOSY
40.0000 mg | PREFILLED_SYRINGE | INTRAMUSCULAR | Status: DC
  Administered 2021-05-19: 40 mg via SUBCUTANEOUS
  Filled 2021-05-19: qty 0.4

## 2021-05-19 MED ORDER — AZITHROMYCIN 250 MG PO TABS
250.0000 mg | ORAL_TABLET | Freq: Every day | ORAL | Status: DC
  Administered 2021-05-20: 250 mg via ORAL
  Filled 2021-05-19: qty 1

## 2021-05-19 MED ORDER — IOHEXOL 350 MG/ML SOLN
75.0000 mL | Freq: Once | INTRAVENOUS | Status: AC | PRN
  Administered 2021-05-19: 75 mL via INTRAVENOUS

## 2021-05-19 MED ORDER — METHOCARBAMOL 500 MG PO TABS
500.0000 mg | ORAL_TABLET | Freq: Two times a day (BID) | ORAL | Status: DC
  Administered 2021-05-19 – 2021-05-20 (×2): 500 mg via ORAL
  Filled 2021-05-19 (×4): qty 1

## 2021-05-19 MED ORDER — METHYLPREDNISOLONE SODIUM SUCC 40 MG IJ SOLR
40.0000 mg | Freq: Two times a day (BID) | INTRAMUSCULAR | Status: DC
  Administered 2021-05-19 – 2021-05-20 (×3): 40 mg via INTRAVENOUS
  Filled 2021-05-19 (×3): qty 1

## 2021-05-19 MED ORDER — METOPROLOL TARTRATE 25 MG PO TABS
25.0000 mg | ORAL_TABLET | Freq: Two times a day (BID) | ORAL | Status: DC
  Administered 2021-05-19: 25 mg via ORAL
  Filled 2021-05-19 (×2): qty 1

## 2021-05-19 MED ORDER — LEVOTHYROXINE SODIUM 50 MCG PO TABS: 75.0000 ug | ORAL_TABLET | Freq: Every day | ORAL | Status: DC

## 2021-05-19 MED ORDER — CALCIUM CARBONATE 1250 (500 CA) MG PO CAPS: 1250.0000 mg | ORAL_CAPSULE | Freq: Every day | ORAL | Status: DC

## 2021-05-19 MED ORDER — AZITHROMYCIN 500 MG PO TABS
500.0000 mg | ORAL_TABLET | Freq: Every day | ORAL | Status: AC
  Administered 2021-05-19: 500 mg via ORAL
  Filled 2021-05-19: qty 1

## 2021-05-19 MED ORDER — PREGABALIN 50 MG PO CAPS
50.0000 mg | ORAL_CAPSULE | Freq: Two times a day (BID) | ORAL | Status: DC
  Administered 2021-05-19 – 2021-05-20 (×3): 50 mg via ORAL
  Filled 2021-05-19 (×3): qty 1

## 2021-05-19 MED ORDER — CALCIUM CARBONATE 1250 (500 CA) MG PO TABS
1250.0000 mg | ORAL_TABLET | Freq: Every day | ORAL | Status: DC
  Administered 2021-05-20: 1250 mg via ORAL
  Filled 2021-05-19 (×2): qty 1

## 2021-05-19 MED ORDER — ACETAMINOPHEN 325 MG PO TABS: 650.0000 mg | ORAL_TABLET | Freq: Four times a day (QID) | ORAL | Status: DC | PRN

## 2021-05-19 MED ORDER — IPRATROPIUM-ALBUTEROL 0.5-2.5 (3) MG/3ML IN SOLN
3.0000 mL | RESPIRATORY_TRACT | Status: DC
  Administered 2021-05-19 – 2021-05-20 (×6): 3 mL via RESPIRATORY_TRACT
  Filled 2021-05-19 (×6): qty 3

## 2021-05-19 MED ORDER — PANTOPRAZOLE SODIUM 40 MG PO TBEC: 40.0000 mg | DELAYED_RELEASE_TABLET | Freq: Every day | ORAL | Status: DC | PRN

## 2021-05-19 MED ORDER — ASPIRIN 81 MG PO CHEW
81.0000 mg | CHEWABLE_TABLET | Freq: Two times a day (BID) | ORAL | Status: DC
  Administered 2021-05-19 – 2021-05-20 (×3): 81 mg via ORAL
  Filled 2021-05-19 (×3): qty 1

## 2021-05-19 NOTE — ED Notes (Signed)
This RN at bedside. Pt requesting to go to bathroom. Pt ambulatory with cane and standby assistance. Pt did become SOB and wheezy upon ambulating but O2 sat maintained above 92% on RA.

## 2021-05-19 NOTE — ED Provider Notes (Signed)
[facility] Emergency Department Provider Note   ____________________________________________   Event Date/Time   First MD Initiated Contact with Patient 05/19/21 0725     (approximate)  I have reviewed the triage vital signs and the nursing notes.   HISTORY  Chief Complaint Weakness    HPI Diane Adkins is a 75 y.o. female with past medical history of hypertension, hyperlipidemia, COPD, seizures, hypothyroidism, and chronic pain syndrome who presents to the ED complaining of weakness and shortness of breath.  Patient reports that she has been feeling increasingly weak with diffuse joint pains over the past couple of months, has been following with rheumatology for this problem and was started on low-dose of steroids.  Symptoms have continued to progress since then and she had a bone scan performed yesterday, but has not yet heard the results.  She states that the weakness seemed to worsen last night and she became increasingly short of breath.  She has been dealing with a cough recently, states she was prescribed a course of doxycycline a couple of weeks ago and initially seemed to improve before returning.  Cough is productive of "ropey" sputum, but she denies any fevers or chest pain.  She has not noticed any pain or swelling in her legs.        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   MRSA (methicillin resistant staph aureus) culture positive 1990's   Opioid dependence ([place])    Pain    chronic back and neck   Seizures ([place])    last 2016   Spinal disorder     stenosis   Thyroid disease    hypothyroid    Patient Active Problem List   Diagnosis Date Noted   COPD exacerbation ([place]) 05/19/2021   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/24/2020   Lumbar facet syndrome (Bilateral) (R>L) 03/03/2017   DDD (degenerative disc disease), lumbar 03/03/2017   Chronic pain syndrome 01/25/2017   Chronic thoracic spine pain 01/25/2017   Numbness of lower extremity (B) 01/25/2017   Long term current use of anticoagulant therapy 14/78/2956   History of Helicobacter pylori infection 01/24/2017   Shoulder pain 10/19/2016   COPD (chronic obstructive pulmonary disease) ([place]) 02/17/2016   Epilepsy ([place]) 02/17/2016   H/O adenomatous polyp of colon 02/17/2016   H/O infectious disease 02/17/2016   Personal history of other infectious and parasitic diseases 02/17/2016   HLD (hyperlipidemia) 02/17/2016   Hypothyroidism 02/17/2016   Degeneration macular 02/17/2016   Arthritis, degenerative 02/17/2016   Presence of stent in coronary artery 02/17/2016   Long term prescription benzodiazepine use 02/17/2016   Long term current use of opiate analgesic 02/17/2016   Long term prescription opiate use 02/17/2016   Opiate use 02/17/2016   Encounter for therapeutic drug level monitoring 02/17/2016   Encounter for pain management planning 02/17/2016   Chronic neck pain (Secondary source of pain) (Bilateral) (R>L) 02/17/2016   Chronic low back pain (Primary Source  of Pain) (Bilateral) (R>L) 02/17/2016   Chronic upper back pain [facility] source of pain) (Bilateral) (R>L) 02/17/2016   Chronic Arm numbness (Bilateral) (R>L) 02/17/2016   Bilateral numbness and tingling of arms and legs (R>L) 02/17/2016   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   History of left hip replacement (Depue recalled replacement) 02/17/2016   MRSA (methicillin resistant Staphylococcus aureus) 02/17/2016    Class: History of   Lumbar spondylosis 02/17/2016   Cervical  spondylosis 02/17/2016   Altered mental status 12/16/2015   Dysphagia 01/16/2014   Carpal tunnel syndrome (S/P Right side release) (Bilateral) (B) (R>L) 11/15/2013   Arteriosclerosis of coronary artery 10/17/2013   Acid reflux 10/17/2013   BP (high blood pressure) 10/17/2013   Seizure ([place]) 05/26/2009    Past Surgical History:  Procedure Laterality Date   ABDOMINAL HYSTERECTOMY     BACK SURGERY     [age]   cervical fusion/thoracic/lumbar with ray cages   BREAST SURGERY     implants   cardiac stents  2013   x 2   CATARACT EXTRACTION W/PHACO Left 03/30/2016   Procedure: CATARACT EXTRACTION PHACO AND INTRAOCULAR LENS PLACEMENT ([place]);  Surgeon: Birder Robson, MD;  Location: [facility] ORS;  Service: Ophthalmology;  Laterality: Left;  [place] 00:45 AP% 19.6 CDE 8.93 Fluid pack lot # [id number]   CATARACT EXTRACTION W/PHACO Right 04/27/2016   Procedure: CATARACT EXTRACTION PHACO AND INTRAOCULAR LENS PLACEMENT (I[facility]);  Surgeon: Birder Robson, MD;  Location: [facility] ORS;  Service: Ophthalmology;  Laterality: Right;  Lot #[id number] H [place]: 00:39.1 AP%: 17.3 CDE: 6.76   CERVICAL SPINE SURGERY     CORONARY ANGIOPLASTY     stent x2   EYE SURGERY     JOINT REPLACEMENT Left 2007   hip   rotator cuff surg Right    TOTAL HIP REVISION Left 01/24/2020   Procedure: TOTAL HIP REVISION;  Surgeon: Lovell Sheehan, MD;  Location: [facility] ORS;  Service: Orthopedics;  Laterality: Left;    Prior to Admission medications   Medication Sig Start Date End Date Taking? Authorizing Provider  albuterol (VENTOLIN HFA) 108 (90 Base) MCG/ACT inhaler Inhale 2 puffs into the lungs every 4 (four) hours as needed for wheezing or shortness of breath.     [provider]  aspirin 81 MG chewable tablet Chew 1 tablet (81 mg total) by mouth 2 (two) times daily. 01/25/20   Lovell Sheehan, MD  calcium carbonate 1250 MG capsule Take 1,250 mg by mouth daily.     [provider]  ciprofloxacin (CIPRO) 250 MG tablet Take 1  tablet (250 mg total) by mouth 2 (two) times daily. 07/30/20   McGowan, Larene [place] A, PA-C  levETIRAcetam (KEPPRA) 500 MG tablet Take 500 mg by mouth 2 (two) times daily.  02/03/19   [provider]  levothyroxine (SYNTHROID) 75 MCG tablet Take 75 mcg by mouth daily before breakfast. Brand Name Only    [provider]  methocarbamol (ROBAXIN) 500 MG tablet Take 500 mg by mouth in the morning and at bedtime.    [provider]  metoprolol tartrate (LOPRESSOR) 25 MG tablet Take 25 mg by mouth 2 (two) times daily.    [provider]  omeprazole (PRILOSEC) 20 MG capsule Take 20 mg by mouth daily as needed (acid reflux).     [provider]  oxyCODONE (ROXICODONE) 15 MG immediate release tablet Take 15 mg by mouth in the morning, at noon, in the evening, and at bedtime.  11/22/19   [provider]    Allergies Keflex [cephalexin], Beef-derived products, Citrus, Lamotrigine, Macrodantin [nitrofurantoin macrocrystal], Nitrofurantoin, Statins, and Fosamax [alendronate sodium]  Family History  Problem Relation Age of Onset   Heart disease Mother    Cancer Father     Social History Social History   Tobacco Use   Smoking status: Former    Packs/day: 0.50    Types: Cigarettes    Quit date: 02/10/2016    Years since quitting: 5.2   Smokeless tobacco: Never  Vaping Use   Vaping Use: Never used  Substance Use Topics   Alcohol use: No   Drug use: No    Review of Systems  Constitutional: No fever/chills.  Positive for generalized weakness and fatigue. Eyes: No visual changes. ENT: No sore throat. Cardiovascular: Denies chest pain. Respiratory: Positive for cough and shortness of breath. Gastrointestinal: No abdominal pain.  No nausea, no vomiting.  No diarrhea.  No constipation. Genitourinary: Negative for dysuria. Musculoskeletal: Negative for back pain.  Positive for joint pains. Skin: Negative for rash. Neurological: Negative for  headaches, focal weakness or numbness.  ____________________________________________   PHYSICAL EXAM:  VITAL SIGNS: ED Triage Vitals  Enc Vitals Group     BP 05/18/21 2123 132/62     Pulse Rate 05/18/21 2123 80     Resp 05/18/21 2123 18     Temp 05/18/21 2123 98.7 F (37.1 C)     Temp Source 05/18/21 2123 Oral     SpO2 05/18/21 2123 93 %     Weight 05/18/21 2123 105 lb (47.6 kg)     Height 05/18/21 2123 5\' 2"  (1.575 m)     Head Circumference --      Peak Flow --      Pain Score 05/18/21 2134 8     Pain Loc --      Pain Edu? --      Excl. in [place]? --     Constitutional: Alert and oriented. Eyes: Conjunctivae are normal. Head: Atraumatic. Nose: No congestion/rhinnorhea. Mouth/Throat: Mucous membranes are moist. Neck: Normal ROM Cardiovascular: Normal rate, regular rhythm. Grossly normal heart sounds.  2+ radial pulses bilaterally. Respiratory: Tachypneic with increased expiratory effort, expiratory wheezing noted throughout. Gastrointestinal: Soft and nontender. No distention. Genitourinary: deferred Musculoskeletal: No lower extremity tenderness nor edema. Neurologic:  Normal speech and language. No gross focal neurologic deficits are appreciated. Skin:  Skin is warm, dry and intact. No rash noted. Psychiatric: Mood and affect are normal. Speech and behavior are normal.  ____________________________________________   LABS (all labs ordered are listed, but only abnormal results are displayed)  Labs Reviewed  BASIC METABOLIC PANEL - Abnormal; Notable for the following components:      Result Value   Glucose, Bld 116 (*)    Creatinine, Ser 1.14 (*)    GFR, Estimated 51 (*)    All other components within normal limits  URINALYSIS, ROUTINE W REFLEX MICROSCOPIC - Abnormal; Notable for the following components:   Color, Urine YELLOW (*)    APPearance CLEAR (*)    Ketones, ur 20 (*)    All other components within normal limits  TROPONIN I (HIGH SENSITIVITY) - Abnormal;  Notable for the following components:   Troponin I (High Sensitivity) 23 (*)    All other components within normal limits  TROPONIN I (HIGH SENSITIVITY) - Abnormal; Notable for the following components:   Troponin I (High Sensitivity) 20 (*)    All other components within normal limits  RESP PANEL BY RT-PCR (FLU A&B, COVID)  ARPGX2  CBC WITH DIFFERENTIAL/PLATELET  BRAIN NATRIURETIC PEPTIDE  TSH  MAGNESIUM   ____________________________________________  EKG  ED ECG REPORT I, Blake Divine, the attending physician, personally viewed and interpreted this ECG.   Date: 05/19/2021  EKG Time: 21:18  Rate: 82  Rhythm: normal sinus rhythm  Axis: Normal  Intervals:none  ST&T Change: None   PROCEDURES  Procedure(s) performed (including Critical Care):  Procedures   ____________________________________________   INITIAL IMPRESSION / ASSESSMENT AND PLAN / ED COURSE      76 year old female with past medical history of hypertension, hyperlipidemia, COPD, seizures, hypothyroidism, and chronic pain syndrome who presents to the ED with a couple of months of increasing weakness, fatigue, and joint pains now with worsening cough and difficulty breathing more recently.  On my assessment, she is in mild respiratory distress with increased work of breathing and expiratory wheezing consistent with COPD exacerbation.  We will give dose of oral prednisone along with breathing treatment.  EKG shows no evidence of arrhythmia or ischemia, chest x-ray reviewed by me and shows no infiltrate, edema, or effusion.  Labs are reassuring with troponin mildly elevated but stable on recheck and I have low suspicion for ACS or PE.  We will add on TSH, magnesium, and urinalysis to further assess her worsening weakness and fatigue.  Plan to reassess following treatment for COPD exacerbation.  Remainder of labs are unremarkable, patient feeling slightly better following prednisone and duo nebs, but continues to  feel somewhat short of breath with expiratory wheezing.  Case discussed with hospitalist for admission for ongoing COPD exacerbation.      ____________________________________________   FINAL CLINICAL IMPRESSION(S) / ED DIAGNOSES  Final diagnoses:  COPD exacerbation ([place])  Generalized weakness     ED Discharge Orders     None        Note:  This document was prepared using Dragon voice recognition software and may include unintentional dictation errors.    Blake Divine, MD 05/19/21 [phone]

## 2021-05-19 NOTE — H&P (Signed)
See History and Physical    Diane Adkins ZSW:[id number] DOB: 1946-02-20 DOA: 05/19/2021  Referring MD/NP/PA:   PCP: Casilda Carls, MD   Patient coming from:  The patient is coming from home.  At baseline, pt is independent for most of ADL.        Chief Complaint: SOB   HPI: Diane Adkins is a 75 y.o. female with medical history significant of hypertension, hyperlipidemia, obesity, GERD, former smoker, hypothyroidism, depression, seizure, fibromyalgia and chronic pain syndrome, lupus, IBS, CAD, stent placement, CKD-3, who presents with shortness of breath.  Patient states that she has shortness of breath for several months, which has worsened in the past several days.  Patient has productive cough and wheezing.  No chest pain.  No fever or chills.  Recently she has finished a course of doxycycline treatment.  Patient has nausea, but no vomiting, diarrhea or abdominal pain.  No symptoms of UTI. She also reports generalized weakness and diffuse joint pain.  Patient states that she has been following up with rheumatologist for her diffuse joint pain who started patient on low-dose steroid.  Addendum: Initially patient did not have oxygen desaturation on room air, but later on developed acute respiratory distress, using accessory muscle to breathe, BiPAP is started in ED.  ED Course: pt was found to have WBC 4.8, pending COVID-19 PCR, troponin level 23, 20, BNP 76, negative urinalysis, stable renal function, temperature normal, blood pressure 137/65, heart rate 82, RR 24, 18. Chest x-ray negative.  Patient is placed on MedSurg bed for observation.  Review of Systems:   General: no fevers, chills, no body weight gain, has fatigue HEENT: no blurry vision, hearing changes or sore throat Respiratory: has dyspnea, coughing, wheezing CV: no chest pain, no palpitations GI: has nausea, no vomiting, abdominal pain, diarrhea, constipation GU: no dysuria, burning on urination, increased urinary frequency,  hematuria  Ext: no leg edema Neuro: no unilateral weakness, numbness, or tingling, no vision change or hearing loss Skin: no rash, no skin tear. MSK: has diffused joint pain Heme: No easy bruising.  Travel history: No recent long distant travel.  Allergy:  Allergies  Allergen Reactions   Keflex [Cephalexin] Other (See Comments) and Hives    Reaction: unknown   Beef-Derived Products     malaise   Citrus     Orange juice/vomiting   Lamotrigine Itching and Swelling   Macrodantin [Nitrofurantoin Macrocrystal] Other (See Comments)    Reaction: unknown   Nitrofurantoin Hives   Statins Hives   Fosamax [Alendronate Sodium] Hives    Past Medical History:  Diagnosis Date   Arthritis    ra   Cardiomegaly    Cataract    COPD (chronic obstructive pulmonary disease) (HCC)    Coronary artery disease    Depression    Diverticulitis    Epilepsy ([place])    Fibromyalgia    GERD (gastroesophageal reflux disease)    HOH (hard of hearing)    aids   Hypertension    Hypothyroidism    Inflammatory bowel disease    Lupus ([place])    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   MRSA (methicillin resistant staph aureus) culture positive 1990's   Opioid dependence (HCC)    Pain    chronic back and neck   Seizures ([place])    last 2016   Spinal disorder    stenosis   Thyroid disease    hypothyroid    Past Surgical History:  Procedure Laterality Date   ABDOMINAL HYSTERECTOMY  BACK SURGERY     [age]   cervical fusion/thoracic/lumbar with ray cages   BREAST SURGERY     implants   cardiac stents  2013   x 2   CATARACT EXTRACTION W/PHACO Left 03/30/2016   Procedure: CATARACT EXTRACTION PHACO AND INTRAOCULAR LENS PLACEMENT ([place]);  Surgeon: Birder Robson, MD;  Location: [facility] ORS;  Service: Ophthalmology;  Laterality: Left;  [place] 00:45 AP% 19.6 CDE 8.93 Fluid pack lot # [id number]   CATARACT EXTRACTION W/PHACO Right 04/27/2016   Procedure: CATARACT EXTRACTION PHACO AND INTRAOCULAR LENS PLACEMENT  (I[facility]);  Surgeon: Birder Robson, MD;  Location: [facility] ORS;  Service: Ophthalmology;  Laterality: Right;  Lot #[id number] H [place]: 00:39.1 AP%: 17.3 CDE: 6.76   CERVICAL SPINE SURGERY     CORONARY ANGIOPLASTY     stent x2   EYE SURGERY     JOINT REPLACEMENT Left 2007   hip   rotator cuff surg Right    TOTAL HIP REVISION Left 01/24/2020   Procedure: TOTAL HIP REVISION;  Surgeon: Lovell Sheehan, MD;  Location: [facility] ORS;  Service: Orthopedics;  Laterality: Left;    Social History:  reports that she quit smoking about 5 years ago. She smoked an average of .5 packs per day. She has never used smokeless tobacco. She reports that she does not drink alcohol and does not use drugs.  Family History:  Family History  Problem Relation Age of Onset   Heart disease Mother    Cancer Father      Prior to Admission medications   Medication Sig Start Date End Date Taking? Authorizing Provider  albuterol (VENTOLIN HFA) 108 (90 Base) MCG/ACT inhaler Inhale 2 puffs into the lungs every 4 (four) hours as needed for wheezing or shortness of breath.     [provider]  aspirin 81 MG chewable tablet Chew 1 tablet (81 mg total) by mouth 2 (two) times daily. 01/25/20   Lovell Sheehan, MD  calcium carbonate 1250 MG capsule Take 1,250 mg by mouth daily.     [provider]  ciprofloxacin (CIPRO) 250 MG tablet Take 1 tablet (250 mg total) by mouth 2 (two) times daily. 07/30/20   McGowan, Larene Beach A, PA-C  levETIRAcetam (KEPPRA) 500 MG tablet Take 500 mg by mouth 2 (two) times daily.  02/03/19   [provider]  levothyroxine (SYNTHROID) 75 MCG tablet Take 75 mcg by mouth daily before breakfast. Brand Name Only    [provider]  methocarbamol (ROBAXIN) 500 MG tablet Take 500 mg by mouth in the morning and at bedtime.    [provider]  metoprolol tartrate (LOPRESSOR) 25 MG tablet Take 25 mg by mouth 2 (two) times daily.    [provider]  omeprazole (PRILOSEC) 20  MG capsule Take 20 mg by mouth daily as needed (acid reflux).     [provider]  oxyCODONE (ROXICODONE) 15 MG immediate release tablet Take 15 mg by mouth in the morning, at noon, in the evening, and at bedtime.  11/22/19   [provider]    Physical Exam: Vitals:   05/19/21 0740 05/19/21 0853 05/19/21 0900 05/19/21 1115  BP: (!) 149/82 137/65 116/67 138/73  Pulse: 82 72 72 92  Resp: (!) 24 18 15  (!) 24  Temp:      TempSrc:      SpO2: 96% 93% 93% 91%  Weight:      Height:       General:  in acute respiratory distress distress.  Thin  body habitus HEENT:       Eyes: PERRL, EOMI, no scleral icterus.       ENT: No discharge from the ears and nose, no pharynx injection, no tonsillar enlargement.        Neck: No JVD, no bruit, no mass felt. Heme: No neck lymph node enlargement. Cardiac: S1/S2, RRR, No murmurs, No gallops or rubs. Respiratory: Has decreased air movement bilaterally with mild wheezing bilaterally GI: Soft, nondistended, nontender, no rebound pain, no organomegaly, BS present. GU: No hematuria Ext: No pitting leg edema bilaterally. 1+DP/PT pulse bilaterally. Musculoskeletal: No joint redness or warmth, no limitation of ROM in spin. Skin: No rashes.  Neuro: Alert, oriented X3, cranial nerves II-XII grossly intact, moves all extremities normally.  Psych: Patient is not psychotic, no suicidal or hemocidal ideation.  Labs on Admission: I have personally reviewed following labs and imaging studies  CBC: Recent Labs  Lab 05/18/21 2124  WBC 4.8  NEUTROABS 2.5  HGB 14.9  HCT 44.8  MCV 89.4  PLT 546   Basic Metabolic Panel: Recent Labs  Lab 05/18/21 2124 05/19/21 0129  NA 139  --   K 4.0  --   CL 100  --   CO2 32  --   GLUCOSE 116*  --   BUN 14  --   CREATININE 1.14*  --   CALCIUM 9.3  --   MG  --  2.1   GFR: Estimated Creatinine Clearance: 32.5 mL/min (A) (by C-G formula based on SCr of 1.14 mg/dL (H)). Liver Function Tests: No  results for input(s): AST, ALT, ALKPHOS, BILITOT, PROT, ALBUMIN in the last 168 hours. No results for input(s): LIPASE, AMYLASE in the last 168 hours. No results for input(s): AMMONIA in the last 168 hours. Coagulation Profile: No results for input(s): INR, PROTIME in the last 168 hours. Cardiac Enzymes: No results for input(s): CKTOTAL, CKMB, CKMBINDEX, TROPONINI in the last 168 hours. BNP (last 3 results) No results for input(s): PROBNP in the last 8760 hours. HbA1C: No results for input(s): HGBA1C in the last 72 hours. CBG: No results for input(s): GLUCAP in the last 168 hours. Lipid Profile: No results for input(s): CHOL, HDL, LDLCALC, TRIG, CHOLHDL, LDLDIRECT in the last 72 hours. Thyroid Function Tests: Recent Labs    05/19/21 0129  TSH 1.789   Anemia Panel: No results for input(s): VITAMINB12, FOLATE, FERRITIN, TIBC, IRON, RETICCTPCT in the last 72 hours. Urine analysis:    Component Value Date/Time   COLORURINE YELLOW (A) 05/19/2021 0803   APPEARANCEUR CLEAR (A) 05/19/2021 0803   APPEARANCEUR Hazy (A) 07/23/2020 0857   LABSPEC 1.025 05/19/2021 0803   PHURINE 5.0 05/19/2021 0803   GLUCOSEU NEGATIVE 05/19/2021 0803   HGBUR NEGATIVE 05/19/2021 0803   BILIRUBINUR NEGATIVE 05/19/2021 0803   BILIRUBINUR Negative 07/23/2020 0857   KETONESUR 20 (A) 05/19/2021 0803   PROTEINUR NEGATIVE 05/19/2021 0803   NITRITE NEGATIVE 05/19/2021 0803   LEUKOCYTESUR NEGATIVE 05/19/2021 0803   Sepsis Labs: @LABRCNTIP (procalcitonin:4,lacticidven:4) ) Recent Results (from the past 240 hour(s))  Resp Panel by RT-PCR (Flu A&B, Covid) Nasopharyngeal Swab     Status: None   Collection Time: 05/19/21  9:18 AM   Specimen: Nasopharyngeal Swab; Nasopharyngeal(NP) swabs in vial transport medium  Result Value Ref Range Status   SARS Coronavirus 2 by RT PCR NEGATIVE NEGATIVE Final    Comment: (NOTE) SARS-CoV-2 target nucleic acids are NOT DETECTED.  The SARS-CoV-2 RNA is generally detectable in  upper respiratory specimens during the acute phase of  infection. The lowest concentration of SARS-CoV-2 viral copies this assay can detect is 138 copies/mL. A negative result does not preclude SARS-Cov-2 infection and should not be used as the sole basis for treatment or other patient management decisions. A negative result may occur with  improper specimen collection/handling, submission of specimen other than nasopharyngeal swab, presence of viral mutation(s) within the areas targeted by this assay, and inadequate number of viral copies(<138 copies/mL). A negative result must be combined with clinical observations, patient history, and epidemiological information. The expected result is Negative.  Fact Sheet for Patients:  EntrepreneurPulse.com.au  Fact Sheet for Healthcare Providers:  IncredibleEmployment.be  This test is no t yet approved or cleared by the [place] FDA and  has been authorized for detection and/or diagnosis of SARS-CoV-2 by FDA under an Emergency Use Authorization (EUA). This EUA will remain  in effect (meaning this test can be used) for the duration of the COVID-19 declaration under Section 564(b)(1) of the Act, 21 U.S.C.section 360bbb-3(b)(1), unless the authorization is terminated  or revoked sooner.       Influenza A by PCR NEGATIVE NEGATIVE Final   Influenza B by PCR NEGATIVE NEGATIVE Final    Comment: (NOTE) The Xpert Xpress SARS-CoV-2/FLU/RSV plus assay is intended as an aid in the diagnosis of influenza from Nasopharyngeal swab specimens and should not be used as a sole basis for treatment. Nasal washings and aspirates are unacceptable for Xpert Xpress SARS-CoV-2/FLU/RSV testing.  Fact Sheet for Patients: EntrepreneurPulse.com.au  Fact Sheet for Healthcare Providers: IncredibleEmployment.be  This test is not yet approved or cleared by the [place] FDA and has been  authorized for detection and/or diagnosis of SARS-CoV-2 by FDA under an Emergency Use Authorization (EUA). This EUA will remain in effect (meaning this test can be used) for the duration of the COVID-19 declaration under Section 564(b)(1) of the Act, 21 U.S.C. section 360bbb-3(b)(1), unless the authorization is terminated or revoked.  Performed at [facility], [address], [place], [place] [address]      Radiological Exams on Admission: DG Chest 2 View  Result Date: 05/18/2021 CLINICAL DATA:  Shortness of breath EXAM: CHEST - 2 VIEW COMPARISON:  Chest radiograph dated 12/15/2015. FINDINGS: No focal consolidation, pleural effusion, pneumothorax. The cardiac silhouette is within limits. Coronary vascular calcification and stent. Atherosclerotic calcification of the aorta. Osteopenia with scoliosis and degenerative changes of the spine. No acute osseous pathology. IMPRESSION: No active cardiopulmonary disease. Electronically Signed   By: Anner Crete M.D.   On: 05/18/2021 22:05     EKG: I have personally reviewed.  Sinus rhythm, QTC 425, LAE, low voltage, nonspecific T wave change  Assessment/Plan Principal Problem:   COPD exacerbation (HCC) Active Problems:   Seizure (HCC)   Acid reflux   Hypothyroidism   Chronic pain syndrome   Joint pain   HTN (hypertension)   CAD (coronary artery disease)   CKD (chronic kidney disease), stage IIIa   Protein-calorie malnutrition, moderate (HCC)   Acute on chronic respiratory failure with hypoxia (HCC)   Acute on chronic respiratory failure with hypoxia due to COPD exacerbation ([place]): Initially patient did not have oxygen desaturation, but later on developed acute respiratory distress, using accessory muscle for breathing, BiPAP was started.  BNP is normal, no pulm edema on chest x-ray, does not seem to have CHF.  Will need to rule out PE.   will admit to progressive unit as inpatient -BiPAP -f/u VBG -f/u CTA to r/o  PE -Bronchodilators -Solu-Medrol 40 mg  IV bid -Z pak  -Mucinex for cough  -Incentive spirometry -check RVP -Nasal cannula oxygen as needed to maintain O2 saturation 93% or greater  Seizure -Seizure precaution -When necessary Ativan for seizure -Continue Home medications:   Acid reflux -Protonix  Hypothyroidism -Synthroid  Chronic pain syndrome -Continue home oxycodone  Joint pain: Patient has diffuse joint pain.  Etiology is not clear.  Patient is following up with rheumatologist, who has been treating patient with low-dose steroid. -Patient is on Solu-Medrol for COPD as above -Patient is still taking oxycodone for chronic pain syndrome -As needed Tylenol  HTN (hypertension) -IV hydralazine as needed -Continue home metoprolol  CAD (coronary artery disease) and elevated troponin: Troponin 23, 20, 12, already normalized.  No active chest pain.  Likely demand ischemia. -Aspirin, metoprolol -Check A1c, FLP  CKD (chronic kidney disease), stage IIIa: Stable -Follow-up with BMP  Protein-calorie malnutrition, moderate (HCC) -Start Ensure    DVT ppx: SQ Lovenox Code Status: Full code Family Communication: Yes, patient's friend    at bed side Disposition Plan:  Anticipate discharge back to previous environment Consults called:  none Admission status and Level of care: Progressive:    Med-surg bed for obs     Status is: Inpatient  Remains inpatient appropriate because: Patient has multiple comorbidities, now presents with acute on chronic respiratory failure with hypoxia due to COPD exacerbation, requiring BiPAP. Patient also has elevated troponin, and moderate protein calorie malnutrition.  Her presentation is highly complicated.  She is at high risk of deteriorating.  Will need to be treated in the hospital for at least 2 days              Date of Service 05/19/2021    [facility]   If 7PM-7AM, please contact  night-coverage [email or web address] 05/19/2021, 1:44 PM

## 2021-05-19 NOTE — ED Notes (Signed)
Pt using accessory muscles to breathe. Blaine Hamper, MD notified. Giving pt ativan and placing on Bipap

## 2021-05-19 NOTE — ED Notes (Signed)
Pt ambulatory to restroom

## 2021-05-19 NOTE — ED Notes (Signed)
Pt via POV from home. Pt c/o weakness for the past couple of days. Pt states she has been seen and currently treated for a URI but last night her SOB got worse. Pt seems to be more SOB on exertion. Pt does have a hx of COPD but does not chronically wear O2. Denies fever. Denies CP. Pt is A&OX4 and NAD.

## 2021-05-20 DIAGNOSIS — J441 Chronic obstructive pulmonary disease with (acute) exacerbation: Secondary | ICD-10-CM | POA: Diagnosis not present

## 2021-05-20 DIAGNOSIS — N183 Chronic kidney disease, stage 3 unspecified: Secondary | ICD-10-CM | POA: Diagnosis not present

## 2021-05-20 DIAGNOSIS — I10 Essential (primary) hypertension: Secondary | ICD-10-CM

## 2021-05-20 DIAGNOSIS — E038 Other specified hypothyroidism: Secondary | ICD-10-CM

## 2021-05-20 DIAGNOSIS — J9621 Acute and chronic respiratory failure with hypoxia: Secondary | ICD-10-CM

## 2021-05-20 DIAGNOSIS — I2510 Atherosclerotic heart disease of native coronary artery without angina pectoris: Secondary | ICD-10-CM

## 2021-05-20 DIAGNOSIS — E440 Moderate protein-calorie malnutrition: Secondary | ICD-10-CM

## 2021-05-20 LAB — BASIC METABOLIC PANEL
Anion gap: 8 (ref 5–15)
BUN: 20 mg/dL (ref 8–23)
CO2: 26 mmol/L (ref 22–32)
Calcium: 9.1 mg/dL (ref 8.9–10.3)
Chloride: 104 mmol/L (ref 98–111)
Creatinine, Ser: 0.76 mg/dL (ref 0.44–1.00)
GFR, Estimated: >60 mL/min (ref >60)
Glucose, Bld: 94 mg/dL (ref 70–99)
Potassium: 4.2 mmol/L (ref 3.5–5.1)
Sodium: 138 mmol/L (ref 135–145)

## 2021-05-20 LAB — LIPID PANEL
Cholesterol: 206 mg/dL — ABNORMAL HIGH (ref 0–200)
HDL: 63 mg/dL (ref >40)
LDL Cholesterol: 132 mg/dL — ABNORMAL HIGH (ref 0–99)
Total CHOL/HDL Ratio: 3.3 RATIO
Triglycerides: 57 mg/dL (ref <150)
VLDL: 11 mg/dL (ref 0–40)

## 2021-05-20 MED ORDER — DM-GUAIFENESIN ER 30-600 MG PO TB12: 1.0000 | ORAL_TABLET | Freq: Two times a day (BID) | ORAL | Status: DC | PRN

## 2021-05-20 MED ORDER — IPRATROPIUM-ALBUTEROL 0.5-2.5 (3) MG/3ML IN SOLN: 3.0000 mL | Freq: Four times a day (QID) | RESPIRATORY_TRACT | Status: DC | PRN

## 2021-05-20 MED ORDER — AZITHROMYCIN 250 MG PO TABS: ORAL_TABLET | ORAL | 0 refills | Status: DC

## 2021-05-20 MED ORDER — MOMETASONE FURO-FORMOTEROL FUM 100-5 MCG/ACT IN AERO: 2.0000 | INHALATION_SPRAY | Freq: Two times a day (BID) | RESPIRATORY_TRACT | 0 refills | Status: DC

## 2021-05-20 MED ORDER — PREDNISONE 20 MG PO TABS: 40.0000 mg | ORAL_TABLET | Freq: Every day | ORAL | 0 refills | Status: AC

## 2021-05-20 MED ORDER — PREDNISONE 20 MG PO TABS: 40.0000 mg | ORAL_TABLET | Freq: Every day | ORAL | Status: DC

## 2021-05-20 NOTE — Evaluation (Signed)
Occupational Therapy Evaluation Patient Details Name: Diane Adkins MRN: [id number] DOB: Nov 01, 1945 Today's Date: 05/20/2021   History of Present Illness Diane Adkins is a 75 y.o. female with medical history significant of hypertension, hyperlipidemia, GERD, former smoker, hypothyroidism, depression, seizure, fibromyalgia and chronic pain syndrome, lupus, IBS, CAD, stent placement, CKD-3, who presents with shortness of breath.   Clinical Impression   Pt seen for OT evaluation this date. Prior to admission, pt was independent in all ADLs and functional mobility, living in a 1-story home alone. Pt does not use supplemental O2 at baseline. Pt reports becoming easily fatigued or out of breath with minimal exertion. Pt currently requires SUPERVISION for UB/LB dressing, SUPERVISION for toilet transfers, SUPERVISION for standing grooming tasks, and SUPERVISION for functional mobility of short household distances due to current functional impairments (See OT Problem List below). While on room air, SpO2 >94% throughout session. Pt educated in energy conservation strategies including pursed lip breathing, activity pacing/planning/positioning, and falls prevention. Pt verbalized understanding and would benefit from additional skilled OT services to maximize recall and carryover of learned techniques and facilitate implementation of learned techniques into daily routines. Upon discharge, recommend no OT follow up.        Recommendations for follow up therapy are one component of a multi-disciplinary discharge planning process, led by the attending physician.  Recommendations may be updated based on patient status, additional functional criteria and insurance authorization.   Follow Up Recommendations  No OT follow up    Assistance Recommended at Discharge PRN  Functional Status Assessment  Patient has had a recent decline in their functional status and demonstrates the ability to make significant improvements  in function in a reasonable and predictable amount of time.  Equipment Recommendations  None recommended by OT       Precautions / Restrictions Precautions Precautions: Fall Precaution Comments: mod fall Restrictions Weight Bearing Restrictions: No      Mobility Bed Mobility               General bed mobility comments: patient received sitting on edge of bed and left sitting in recliner at end of session    Transfers Overall transfer level: Needs assistance Equipment used: None Transfers: Sit to/from Stand Sit to Stand: Supervision           General transfer comment: able to stand without physical assist from bed and toilet      Balance Overall balance assessment: Needs assistance Sitting-balance support: Feet supported;No upper extremity supported Sitting balance-Leahy Scale: Good Sitting balance - Comments: Good sitting balance during sit>stand LB dressing   Standing balance support: No upper extremity supported;During functional activity Standing balance-Leahy Scale: Fair Standing balance comment: SUPERVISION for functional mobility of short household distances                           ADL either performed or assessed with clinical judgement   ADL Overall ADL's : Needs assistance/impaired     Grooming: Brushing hair;Wash/dry hands;Supervision/safety;Standing           Upper Body Dressing : Supervision/safety;Set up;Sitting   Lower Body Dressing: Supervision/safety;Set up;Sit to/from stand   Toilet Transfer: Supervision/safety;Regular Museum/gallery exhibitions officer and Hygiene: Modified independent;Sitting/lateral lean       Functional mobility during ADLs: Supervision/safety       Vision Baseline Vision/History: 1 Wears glasses (Pt reports 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))  Ability to See in Adequate Light: 1 Impaired Patient Visual Report: No change from  baseline Additional Comments: Pt able to read large print, but not print in newspapers/books            Pertinent Vitals/Pain Pain Assessment: No/denies pain Faces Pain Scale: Hurts a little bit Pain Location: generalized, multiple back and neck surgeries, as well as hip surgery Pain Descriptors / Indicators: Discomfort Pain Intervention(s): Limited activity within patient's tolerance;Monitored during session;Repositioned        Extremity/Trunk Assessment Upper Extremity Assessment Upper Extremity Assessment: Generalized weakness   Lower Extremity Assessment Lower Extremity Assessment: Generalized weakness   Cervical / Trunk Assessment Cervical / Trunk Assessment: Kyphotic;Back Surgery;Neck Surgery   Communication Communication Communication: HOH   Cognition Arousal/Alertness: Awake/alert Behavior During Therapy: WFL for tasks assessed/performed Overall Cognitive Status: Within Functional Limits for tasks assessed                                       General Comments  SpO2>94% throughout session while on RA    Exercises Other Exercises Other Exercises: Pt educated on energy conservation strategies, such as activity planning, pacing, and position (e.g., sitting during ADLs). Pt verbalized understanding        Home Living Family/patient expects to be discharged to:: Private residence Living Arrangements: Alone Available Help at Discharge: Friend(s);Available PRN/intermittently (pt has no living relatives. Reports that friends are available to assist PRN) Type of Home: House Home Access: Stairs to enter [employer] of Steps: 2   Home Layout: One level               Home Equipment: [place] - single point;Standard Walker          Prior Functioning/Environment Prior Level of Function : Independent/Modified Independent             Mobility Comments: patient was using cane prior to admission ADLs Comments: independent with ADLs.  Uses app on phone to order groceries        OT Problem List: Decreased activity tolerance;Impaired balance (sitting and/or standing);Cardiopulmonary status limiting activity      OT Treatment/Interventions: Self-care/ADL training;Therapeutic exercise;Energy conservation;Patient/family education;Balance training    OT Goals(Current goals can be found in the care plan section) Acute Rehab OT Goals Patient Stated Goal: to breathe better OT Goal Formulation: With patient Time For Goal Achievement: 06/03/21 Potential to Achieve Goals: Good ADL Goals Pt Will Perform Grooming: with modified independence;standing (standing for at least 8 mins) Pt Will Perform Lower Body Dressing: with modified independence;sit to/from stand Pt Will Transfer to Toilet: with modified independence;ambulating;regular height toilet Additional ADL Goal #1: Pt will independently initiate x2 energy conservation strategies during standing ADLs/functional mobilty  OT Frequency: Min 1X/week   Barriers to D/C: Decreased caregiver support  Pt reports that she has no living relatives, only friends who are only available PRN          AM-PAC OT "6 Clicks" Daily Activity     Outcome Measure Help from another person eating meals?: None Help from another person taking care of personal grooming?: None Help from another person toileting, which includes using toliet, bedpan, or urinal?: A Little Help from another person bathing (including washing, rinsing, drying)?: A Little Help from another person to put on and taking off regular upper body clothing?: A Little Help from another person to put on and taking off regular lower body clothing?:  A Little 6 Click Score: 20   End of Session Nurse Communication: Mobility status  Activity Tolerance: Patient tolerated treatment well Patient left: in chair;with call bell/phone within reach  OT Visit Diagnosis: Low vision, both eyes (H54.2);Muscle weakness (generalized) (M62.81)                 Time: 1432-1510 OT Time Calculation (min): 38 min Charges:  OT General Charges $OT Visit: 1 Visit OT Evaluation $OT Eval Moderate Complexity: 1 Mod OT Treatments $Self Care/Home Management : 23-37 mins  Fredirick Maudlin, OTR/L [place]

## 2021-05-20 NOTE — Progress Notes (Signed)
SaO2 on room air at rest = 97% SaO2 on room air while ambulating = 95%  Patient did not desaturate with ambulation. Patient did have increased work of breathing with HR increasing to 110 while ambulating.

## 2021-05-20 NOTE — Discharge Summary (Signed)
Physician Discharge Summary  Patient ID: Diane Adkins MRN: [id number] DOB/AGE: Jun 05, 1946 75 y.o.  Admit date: 05/19/2021 Discharge date: 05/20/2021  Admission Diagnoses:  Discharge Diagnoses:  Principal Problem:   COPD exacerbation ([place]) Active Problems:   Seizure ([place])   Acid reflux   Hypothyroidism   Chronic pain syndrome   Joint pain   HTN (hypertension)   CAD (coronary artery disease)   CKD (chronic kidney disease), stage IIIa   Protein-calorie malnutrition, moderate (HCC)   Acute on chronic respiratory failure with hypoxia [facility])   Discharged Condition: good  Hospital Course: Patient presented to emergency department with gradually worsening shortness of breath over several days including a productive cough and wheezing.  In the ED, patient had desaturations on room air and increased work of breathing so was put on supplemental oxygen and BiPAP for a short duration.  She was also started on nebulizer treatments, IV steroids and antibiotics for presumed COPD exacerbation.  She does not have home inhalers. Patient made rapid recovery with stated interventions and overnight was able to be weaned to room air.  She continued to have auditory wheezing coming from oropharynx but her lung fields were clear and she had no desaturations when ambulating on pulse ox.  She was discharged with prescriptions for all new medications including completing her antibiotic and steroid course and a new daily inhaler to help prevent recurrence of COPD exacerbations. She was evaluated by PT and OT during her admission and they determined she had no follow-up needs at this time.  Consults: None  Discharge Exam: Blood pressure 99/61, pulse 76, temperature 98.1 F (36.7 C), temperature source Oral, resp. rate 16, height 5\' 2"  (1.575 m), weight 47.6 kg, SpO2 98 %. Physical Exam Vitals and nursing note reviewed. Exam conducted with a chaperone present.  Constitutional:      General: She is not in acute  distress.    Appearance: She is not ill-appearing, toxic-appearing or diaphoretic.  HENT:     Head: Normocephalic.     Nose: Nose normal.     Mouth/Throat:     Mouth: Mucous membranes are moist.  Eyes:     Conjunctiva/sclera: Conjunctivae normal.  Cardiovascular:     Rate and Rhythm: Normal rate and regular rhythm.     Pulses: Normal pulses.  Pulmonary:     Effort: Pulmonary effort is normal.     Breath sounds: Normal breath sounds. Stridor present. No wheezing, rhonchi or rales.  Abdominal:     General: Abdomen is flat.     Palpations: Abdomen is soft.  Musculoskeletal:        General: No swelling.  Lymphadenopathy:     Cervical: No cervical adenopathy.  Skin:    General: Skin is warm and dry.     Capillary Refill: Capillary refill takes less than 2 seconds.  Neurological:     General: No focal deficit present.     Mental Status: She is alert and oriented to person, place, and time.  Psychiatric:     Comments: Anxious mood    Disposition: Discharge disposition: 01-Home or Self Care      Discharge Instructions     Discharge patient   Complete by: As directed    Discharge disposition: 01-Home or Self Care   Discharge patient date: 05/20/2021      Allergies as of 05/20/2021       Reactions   Keflex [cephalexin] Other (See Comments), Hives   Reaction: unknown   Beef-derived Products  malaise   Citrus    Orange juice/vomiting   Lamotrigine Itching, Swelling   Macrodantin [nitrofurantoin Macrocrystal] Other (See Comments)   Reaction: unknown   Nitrofurantoin Hives   Statins Hives   Fosamax [alendronate Sodium] Hives        Medication List     TAKE these medications    albuterol 108 (90 Base) MCG/ACT inhaler Commonly known as: VENTOLIN HFA Inhale 2 puffs into the lungs every 4 (four) hours as needed for wheezing or shortness of breath.   aspirin 81 MG chewable tablet Chew 1 tablet (81 mg total) by mouth 2 (two) times daily.   azithromycin 250 MG  tablet Commonly known as: ZITHROMAX Daily for 4 days. MAY ADMINISTER WITHOUT REGARD TO MEALS Start taking on: May 21, 2021   calcium carbonate 1250 MG capsule Take 1,250 mg by mouth daily.   dextromethorphan-guaiFENesin 30-600 MG 12hr tablet Commonly known as: MUCINEX DM Take 1 tablet by mouth 2 (two) times daily as needed for cough.   levETIRAcetam 500 MG tablet Commonly known as: KEPPRA Take 500 mg by mouth 2 (two) times daily.   levothyroxine 75 MCG tablet Commonly known as: SYNTHROID Take 75 mcg by mouth daily before breakfast. Brand Name Only   methocarbamol 500 MG tablet Commonly known as: ROBAXIN Take 500 mg by mouth in the morning and at bedtime.   metoprolol tartrate 25 MG tablet Commonly known as: LOPRESSOR Take 25 mg by mouth 2 (two) times daily.   mometasone-formoterol 100-5 MCG/ACT Aero Commonly known as: DULERA Inhale 2 puffs into the lungs 2 (two) times daily.   omeprazole 20 MG capsule Commonly known as: PRILOSEC Take 20 mg by mouth daily as needed (acid reflux).   oxyCODONE 15 MG immediate release tablet Commonly known as: ROXICODONE Take 15 mg by mouth in the morning, at noon, in the evening, and at bedtime.   predniSONE 20 MG tablet Commonly known as: DELTASONE Take 2 tablets (40 mg total) by mouth daily with breakfast for 3 days. Start taking on: May 21, 2021   pregabalin 50 MG capsule Commonly known as: LYRICA Take 50 mg by mouth 2 (two) times daily.         Signed: Richarda Osmond 05/20/2021, 5:59 PM

## 2021-05-20 NOTE — Evaluation (Signed)
Physical Therapy Evaluation Patient Details Name: Diane Adkins MRN: [id number] DOB: 1946/07/04 Today's Date: 05/20/2021  History of Present Illness  Diane Adkins is a 75 y.o. female with medical history significant of hypertension, hyperlipidemia, GERD, former smoker, hypothyroidism, depression, seizure, fibromyalgia and chronic pain syndrome, lupus, IBS, CAD, stent placement, CKD-3, who presents with shortness of breath.   Clinical Impression  Patient received sitting on side of bed finishing lunch. She is agreeable to PT assessment. Patient is HOH. She is independent with transfers and ambulated into bathroom without AD with supervision. Ambulated in hallway 150 feet with RW. One standing rest break needed due to fatigue and sob. Patient demonstrates decreased activity tolerance and sob with mobility. She will continue to benefit from skilled PT while here to improve strength and functional independence.         Recommendations for follow up therapy are one component of a multi-disciplinary discharge planning process, led by the attending physician.  Recommendations may be updated based on patient status, additional functional criteria and insurance authorization.  Follow Up Recommendations No PT follow up    Assistance Recommended at Discharge Intermittent Supervision/Assistance  Functional Status Assessment Patient has had a recent decline in their functional status and demonstrates the ability to make significant improvements in function in a reasonable and predictable amount of time.  Equipment Recommendations  Rollator (4 wheels)    Recommendations for Other Services       Precautions / Restrictions Precautions Precautions: Fall Precaution Comments: mod fall Restrictions Weight Bearing Restrictions: No      Mobility  Bed Mobility               General bed mobility comments: patient received sitting on edge of bed and returned to edge of bed at end of session     Transfers Overall transfer level: Needs assistance Equipment used: None Transfers: Sit to/from Stand Sit to Stand: Supervision           General transfer comment: able to stand without physical assist from bed and toilet    Ambulation/Gait Ambulation/Gait assistance: Supervision;Min guard Gait Distance (Feet): 150 Feet Assistive device: Rolling walker (2 wheels) Gait Pattern/deviations: Step-through pattern;Decreased step length - right;Decreased step length - left;Trunk flexed Gait velocity: decr     General Gait Details: fatigued, requiring one standing rest break. O2 sat remained in upper 90%s throughout session, patient sob after walking.  Stairs            Wheelchair Mobility    Modified Rankin (Stroke Patients Only)       Balance Overall balance assessment: Needs assistance Sitting-balance support: Feet supported Sitting balance-Leahy Scale: Good     Standing balance support: Bilateral upper extremity supported;During functional activity Standing balance-Leahy Scale: Fair Standing balance comment: benefits from B UE support with mobility for safety                             Pertinent Vitals/Pain Pain Assessment: Faces Faces Pain Scale: Hurts a little bit Pain Location: generalized, multiple back and neck surgeries, as well as hip surgery Pain Descriptors / Indicators: Discomfort Pain Intervention(s): Limited activity within patient's tolerance;Monitored during session;Repositioned    Home Living Family/patient expects to be discharged to:: Private residence Living Arrangements: Alone   Type of Home: House Home Access: Stairs to enter   [employer] of Steps: 2   Home Layout: One level Home Equipment: [place] - single point;Standard Environmental consultant  Prior Function Prior Level of Function : Independent/Modified Independent             Mobility Comments: patient was using cane prior to admission ADLs Comments:  independent     Hand Dominance        Extremity/Trunk Assessment   Upper Extremity Assessment Upper Extremity Assessment: Defer to OT evaluation    Lower Extremity Assessment Lower Extremity Assessment: Generalized weakness    Cervical / Trunk Assessment Cervical / Trunk Assessment: Kyphotic;Back Surgery;Neck Surgery  Communication   Communication: HOH  Cognition Arousal/Alertness: Awake/alert Behavior During Therapy: WFL for tasks assessed/performed Overall Cognitive Status: Within Functional Limits for tasks assessed                                          General Comments      Exercises     Assessment/Plan    PT Assessment Patient needs continued PT services  PT Problem List Decreased strength;Decreased mobility;Decreased activity tolerance;Cardiopulmonary status limiting activity;Decreased knowledge of use of DME       PT Treatment Interventions Therapeutic activities;Gait training;Therapeutic exercise;Stair training;Functional mobility training;Patient/family education;DME instruction    PT Goals (Current goals can be found in the Care Plan section)  Acute Rehab PT Goals Patient Stated Goal: to return home PT Goal Formulation: With patient Time For Goal Achievement: 06/03/21 Potential to Achieve Goals: Good    Frequency Min 2X/week   Barriers to discharge Decreased caregiver support patient has no assistance at home, gets groceries delivered    Co-evaluation               AM-PAC PT "6 Clicks" Mobility  Outcome Measure Help needed turning from your back to your side while in a flat bed without using bedrails?: None Help needed moving from lying on your back to sitting on the side of a flat bed without using bedrails?: None Help needed moving to and from a bed to a chair (including a wheelchair)?: A Little Help needed standing up from a chair using your arms (e.g., wheelchair or bedside chair)?: None Help needed to walk in  hospital room?: A Little Help needed climbing 3-5 steps with a railing? : A Little 6 Click Score: 21    End of Session Equipment Utilized During Treatment: Gait belt Activity Tolerance: Patient limited by fatigue Patient left: in bed Nurse Communication: Mobility status      Time: 2563-8937 PT Time Calculation (min) (ACUTE ONLY): 27 min   Charges:   PT Evaluation $PT Eval Moderate Complexity: 1 Mod PT Treatments $Gait Training: 8-22 mins        [employer], PT, [facility] 05/20/21,2:06 PM

## 2021-05-20 NOTE — Discharge Instructions (Signed)
Please continue your antibiotics and steroids until they are complete.  I have prescribed an inhaler similar to the nebulizer you got while at the hospital. Please use this twice per day and follow up with your primary doctor in 1-2 weeks to monitor your respiratory status and if you need refills.
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
## 2021-05-20 NOTE — Progress Notes (Signed)
Discharge instructions explained to pt/ verbalized an understanding/ transported off unit via wheelchair.

## 2021-05-20 NOTE — Progress Notes (Signed)
Notified by SW Shelton Silvas that patient has lost glasses.  This RN completed admission assessment with patient 05/19/2021, unsure if black glasses patient states are missing were present.  This RN explained that unfortunately if patient had belongings that were lost at the bedside, we would not be able to take responsibility for the loss of items.

## 2021-08-28 ENCOUNTER — Other Ambulatory Visit: Payer: Self-pay | Admitting: Infectious Diseases

## 2021-08-28 DIAGNOSIS — Z1231 Encounter for screening mammogram for malignant neoplasm of breast: Secondary | ICD-10-CM

## 2021-11-20 ENCOUNTER — Other Ambulatory Visit: Payer: Self-pay

## 2021-11-20 ENCOUNTER — Emergency Department: Payer: Medicare HMO

## 2021-11-20 ENCOUNTER — Emergency Department
Admission: EM | Admit: 2021-11-20 | Discharge: 2021-11-21 | Disposition: A | Discharge status: Home / Self Care | Payer: Medicare HMO | Attending: Emergency Medicine | Admitting: Emergency Medicine

## 2021-11-20 DIAGNOSIS — Z20822 Contact with and (suspected) exposure to covid-19: Secondary | ICD-10-CM | POA: Insufficient documentation

## 2021-11-20 DIAGNOSIS — R791 Abnormal coagulation profile: Secondary | ICD-10-CM | POA: Diagnosis not present

## 2021-11-20 DIAGNOSIS — K5900 Constipation, unspecified: Secondary | ICD-10-CM

## 2021-11-20 DIAGNOSIS — D61818 Other pancytopenia: Secondary | ICD-10-CM

## 2021-11-20 DIAGNOSIS — J449 Chronic obstructive pulmonary disease, unspecified: Secondary | ICD-10-CM | POA: Insufficient documentation

## 2021-11-20 DIAGNOSIS — R112 Nausea with vomiting, unspecified: Secondary | ICD-10-CM | POA: Diagnosis present

## 2021-11-20 LAB — CBC WITH DIFFERENTIAL/PLATELET
Abs Immature Granulocytes: 0.01 10*3/uL (ref 0.00–0.07)
Abs Immature Granulocytes: 0.03 10*3/uL (ref 0.00–0.07)
Basophils Absolute: 0 10*3/uL (ref 0.0–0.1)
Basophils Absolute: 0 10*3/uL (ref 0.0–0.1)
Basophils Relative: 1 %
Basophils Relative: 1 %
Eosinophils Absolute: 0.1 10*3/uL (ref 0.0–0.5)
Eosinophils Absolute: 0.1 10*3/uL (ref 0.0–0.5)
Eosinophils Relative: 5 %
Eosinophils Relative: 4 %
HCT: 31.4 % — ABNORMAL LOW (ref 36.0–46.0)
HCT: 32 % — ABNORMAL LOW (ref 36.0–46.0)
Hemoglobin: 10.4 g/dL — ABNORMAL LOW (ref 12.0–15.0)
Hemoglobin: 10.6 g/dL — ABNORMAL LOW (ref 12.0–15.0)
Immature Granulocytes: 0 %
Immature Granulocytes: 1 %
Lymphocytes Relative: 22 %
Lymphocytes Relative: 29 %
Lymphs Abs: 0.7 10*3/uL (ref 0.7–4.0)
Lymphs Abs: 0.9 10*3/uL (ref 0.7–4.0)
MCH: 29.1 pg (ref 26.0–34.0)
MCH: 29.3 pg (ref 26.0–34.0)
MCHC: 33.1 g/dL (ref 30.0–36.0)
MCHC: 33.1 g/dL (ref 30.0–36.0)
MCV: 88 fL (ref 80.0–100.0)
MCV: 88.4 fL (ref 80.0–100.0)
Monocytes Absolute: 0.1 10*3/uL (ref 0.1–1.0)
Monocytes Absolute: 0.1 10*3/uL (ref 0.1–1.0)
Monocytes Relative: 2 %
Monocytes Relative: 3 %
Neutro Abs: 2.1 10*3/uL (ref 1.7–7.7)
Neutro Abs: 2 10*3/uL (ref 1.7–7.7)
Neutrophils Relative %: 70 %
Neutrophils Relative %: 62 %
Platelets: 50 10*3/uL — ABNORMAL LOW (ref 150–400)
Platelets: 48 10*3/uL — ABNORMAL LOW (ref 150–400)
RBC: 3.57 MIL/uL — ABNORMAL LOW (ref 3.87–5.11)
RBC: 3.62 MIL/uL — ABNORMAL LOW (ref 3.87–5.11)
RDW: 14.3 % (ref 11.5–15.5)
RDW: 14.2 % (ref 11.5–15.5)
Smear Review: NORMAL
WBC: 3 10*3/uL — ABNORMAL LOW (ref 4.0–10.5)
WBC: 3.1 10*3/uL — ABNORMAL LOW (ref 4.0–10.5)
nRBC: 0 % (ref 0.0–0.2)
nRBC: 0 % (ref 0.0–0.2)

## 2021-11-20 LAB — URINALYSIS, ROUTINE W REFLEX MICROSCOPIC
Bacteria, UA: NONE SEEN
Bilirubin Urine: NEGATIVE
Glucose, UA: NEGATIVE mg/dL
Ketones, ur: 20 mg/dL — AB
Leukocytes,Ua: NEGATIVE
Nitrite: NEGATIVE
Protein, ur: NEGATIVE mg/dL
Specific Gravity, Urine: 1.029 (ref 1.005–1.030)
Squamous Epithelial / HPF: NONE SEEN (ref 0–5)
WBC, UA: NONE SEEN WBC/hpf (ref 0–5)
pH: 5 (ref 5.0–8.0)

## 2021-11-20 LAB — COMPREHENSIVE METABOLIC PANEL
ALT: 15 U/L (ref 0–44)
AST: 32 U/L (ref 15–41)
Albumin: 3 g/dL — ABNORMAL LOW (ref 3.5–5.0)
Alkaline Phosphatase: 75 U/L (ref 38–126)
Anion gap: 6 (ref 5–15)
BUN: 23 mg/dL (ref 8–23)
CO2: 25 mmol/L (ref 22–32)
Calcium: 8.4 mg/dL — ABNORMAL LOW (ref 8.9–10.3)
Chloride: 107 mmol/L (ref 98–111)
Creatinine, Ser: 0.75 mg/dL (ref 0.44–1.00)
GFR, Estimated: >60 mL/min (ref >60)
Glucose, Bld: 101 mg/dL — ABNORMAL HIGH (ref 70–99)
Potassium: 4.1 mmol/L (ref 3.5–5.1)
Sodium: 138 mmol/L (ref 135–145)
Total Bilirubin: 0.4 mg/dL (ref 0.3–1.2)
Total Protein: 6.3 g/dL — ABNORMAL LOW (ref 6.5–8.1)

## 2021-11-20 LAB — PROTIME-INR
INR: 1.1 (ref 0.8–1.2)
Prothrombin Time: 13.6 seconds (ref 11.4–15.2)

## 2021-11-20 LAB — RESP PANEL BY RT-PCR (FLU A&B, COVID) ARPGX2
Influenza A by PCR: NEGATIVE
Influenza B by PCR: NEGATIVE
SARS Coronavirus 2 by RT PCR: NEGATIVE

## 2021-11-20 LAB — APTT: aPTT: 33 seconds (ref 24–36)

## 2021-11-20 LAB — TROPONIN I (HIGH SENSITIVITY)
Troponin I (High Sensitivity): 8 ng/L (ref <18)
Troponin I (High Sensitivity): 8 ng/L (ref <18)

## 2021-11-20 LAB — FIBRINOGEN: Fibrinogen: 577 mg/dL — ABNORMAL HIGH (ref 210–475)

## 2021-11-20 LAB — GROUP A STREP BY PCR: Group A Strep by PCR: NOT DETECTED

## 2021-11-20 LAB — LIPASE, BLOOD: Lipase: 28 U/L (ref 11–51)

## 2021-11-20 LAB — LACTATE DEHYDROGENASE: LDH: 188 U/L (ref 98–192)

## 2021-11-20 MED ORDER — BISACODYL 10 MG RE SUPP
10.0000 mg | Freq: Once | RECTAL | Status: DC
  Filled 2021-11-20: qty 1

## 2021-11-20 MED ORDER — ONDANSETRON 4 MG PO TBDP: 4.0000 mg | ORAL_TABLET | Freq: Three times a day (TID) | ORAL | 0 refills | Status: AC | PRN

## 2021-11-20 MED ORDER — LACTATED RINGERS IV BOLUS
1000.0000 mL | Freq: Once | INTRAVENOUS | Status: AC
Start: 2021-11-20 — End: 2021-11-20
  Administered 2021-11-20: 1000 mL via INTRAVENOUS

## 2021-11-20 MED ORDER — IOHEXOL 300 MG/ML  SOLN
80.0000 mL | Freq: Once | INTRAMUSCULAR | Status: AC | PRN
  Administered 2021-11-20: 80 mL via INTRAVENOUS

## 2021-11-20 MED ORDER — POLYETHYLENE GLYCOL 3350 17 G PO PACK
17.0000 g | PACK | Freq: Once | ORAL | Status: DC
  Filled 2021-11-20: qty 1

## 2021-11-20 MED ORDER — OXYCODONE HCL 15 MG PO TABS: 15.0000 mg | ORAL_TABLET | Freq: Four times a day (QID) | ORAL | 0 refills | Status: AC | PRN

## 2021-11-20 MED ORDER — OXYCODONE HCL 5 MG PO TABS
15.0000 mg | ORAL_TABLET | ORAL | Status: AC
  Administered 2021-11-20: 15 mg via ORAL
  Filled 2021-11-20: qty 3

## 2021-11-20 MED ORDER — POLYETHYLENE GLYCOL 3350 17 GM/SCOOP PO POWD: ORAL | 0 refills | Status: DC

## 2021-11-20 MED ORDER — SENNOSIDES-DOCUSATE SODIUM 8.6-50 MG PO TABS: 2.0000 | ORAL_TABLET | Freq: Two times a day (BID) | ORAL | 0 refills | Status: AC

## 2021-11-20 NOTE — ED Notes (Signed)
Pt ambulated to BR with steady gait. Pt unable to void at this time. Per MD, OK to wait for UA until after IVF bolus is complete.  ?

## 2021-11-20 NOTE — ED Notes (Signed)
Pt transported back to room from CT via stretcher.  

## 2021-11-20 NOTE — ED Triage Notes (Signed)
Pt BIB [facility] from home C/O n/v since waking today. Pt received 4 mg Zofran and 200 mL NS en route. Pt denies abdominal pain. Pt is unsure when last BM was, but reports it has been "quite a while." ? ? ?

## 2021-11-20 NOTE — ED Provider Notes (Signed)
? ?[facility] ?Provider Note ? ? ? Event Date/Time  ? First MD Initiated Contact with Patient 11/20/21 1722   ?  (approximate) ? ? ?History  ? ?Nausea ? ? ?HPI ? ?Diane Adkins is a 76 y.o. female with a history of chronic pain, rheumatoid arthritis, COPD, epilepsy who comes ED complaining of nausea and vomiting that started last night, continued throughout the day today.  She reports constipation recently.  Denies abdominal pain.  No chest pain shortness of breath fever or chills.  No rash. ? ?Reviewed outside records, noting that she was seen in rheumatology clinic about 2 weeks ago for monitoring of her rheumatoid arthritis which is being managed with prednisone and methotrexate. ?  ? ? ?Physical Exam  ? ?Triage Vital Signs: ?ED Triage Vitals  ?Enc Vitals Group  ?   BP 11/20/21 1730 (!) 115/51  ?   Pulse Rate 11/20/21 1730 84  ?   Resp 11/20/21 1730 16  ?   Temp 11/20/21 1730 97.8 ?F (36.6 ?C)  ?   Temp Source 11/20/21 1730 Oral  ?   SpO2 11/20/21 1730 96 %  ?   Weight 11/20/21 1729 86 lb (39 kg)  ?   Height 11/20/21 1729 '5\' 2"'$  (1.575 m)  ?   Head Circumference --   ?   Peak Flow --   ?   Pain Score 11/20/21 1727 8  ?   Pain Loc --   ?   Pain Edu? --   ?   Excl. in [place]? --   ? ? ?Most recent vital signs: ?Vitals:  ? 11/20/21 2030 11/20/21 2100  ?BP: (!) 123/92 (!) 126/94  ?Pulse: 85 82  ?Resp: (!) 22 (!) 22  ?Temp:    ?SpO2: 97% 96%  ? ? ? ?General: Awake, no distress.  Nontoxic appearance ?CV:  Good peripheral perfusion.  Regular rate and rhythm.  Normal radial pulses. ?Resp:  Normal effort.  Clear to auscultation bilaterally ?Abd:  No distention.  Soft and nontender ?Other:  Mild pharyngeal erythema.  Moist oral mucosa.  No tonsillar swelling or exudate.  No lymphadenopathy.  No oral lesions. ?No rash. ? ? ?ED Results / Procedures / Treatments  ? ?Labs ?(all labs ordered are listed, but only abnormal results are displayed) ?Labs Reviewed  ?COMPREHENSIVE METABOLIC PANEL - Abnormal; Notable  for the following components:  ?    Result Value  ? Glucose, Bld 101 (*)   ? Calcium 8.4 (*)   ? Total Protein 6.3 (*)   ? Albumin 3.0 (*)   ? All other components within normal limits  ?CBC WITH DIFFERENTIAL/PLATELET - Abnormal; Notable for the following components:  ? WBC 3.0 (*)   ? RBC 3.57 (*)   ? Hemoglobin 10.4 (*)   ? HCT 31.4 (*)   ? Platelets 50 (*)   ? All other components within normal limits  ?URINALYSIS, ROUTINE W REFLEX MICROSCOPIC - Abnormal; Notable for the following components:  ? Color, Urine YELLOW (*)   ? APPearance CLEAR (*)   ? Hgb urine dipstick MODERATE (*)   ? Ketones, ur 20 (*)   ? All other components within normal limits  ?CBC WITH DIFFERENTIAL/PLATELET - Abnormal; Notable for the following components:  ? WBC 3.1 (*)   ? RBC 3.62 (*)   ? Hemoglobin 10.6 (*)   ? HCT 32.0 (*)   ? Platelets 48 (*)   ? All other components within normal limits  ?FIBRINOGEN -  Abnormal; Notable for the following components:  ? Fibrinogen 577 (*)   ? All other components within normal limits  ?GROUP A STREP BY PCR  ?RESP PANEL BY RT-PCR (FLU A&B, COVID) [id number]  ?LIPASE, BLOOD  ?LACTATE DEHYDROGENASE  ?PROTIME-INR  ?APTT  ?PATHOLOGIST SMEAR REVIEW  ?HAPTOGLOBIN  ?TROPONIN I (HIGH SENSITIVITY)  ?TROPONIN I (HIGH SENSITIVITY)  ? ? ? ?EKG ? ?Interpreted by me ?Sinus rhythm, rate of 80.  Normal axis, normal intervals.  Poor R wave progression.  Normal ST segments.  Normal T waves. ? ?Repeat EKG shows sinus rhythm, rate of 83.  Normal axis, normal intervals.  Poor R wave progression.  Normal ST segments.  T waves look somewhat peaked, but may be artifactual due to high amplitudes. ? ? ?RADIOLOGY ?CT abdomen pelvis viewed and interpreted by me, no mass or organomegaly.  No evidence of bowel obstruction.  No hernia.  Radiology report reviewed. ? ? ? ?PROCEDURES: ? ?Critical Care performed: No ? ?Procedures ? ? ?MEDICATIONS ORDERED IN ED: ?Medications  ?bisacodyl (DULCOLAX) suppository 10 mg (has no administration in  time range)  ?polyethylene glycol (MIRALAX / GLYCOLAX) packet 17 g (has no administration in time range)  ?lactated ringers bolus 1,000 mL (0 mLs Intravenous Stopped 11/20/21 1939)  ?iohexol (OMNIPAQUE) 300 MG/ML solution 80 mL (80 mLs Intravenous Contrast Given 11/20/21 1838)  ? ? ? ?IMPRESSION / MDM / ASSESSMENT AND PLAN / ED COURSE  ?I reviewed the triage vital signs and the nursing notes. ?             ?               ? ?Differential diagnosis includes, but is not limited to, constipation, bowel obstruction, hernia, viral illness, dehydration, electrolyte abnormality, renal insufficiency ? ?**The patient is on the cardiac monitor to evaluate for evidence of arrhythmia and/or significant heart rate changes.**} ? ?Patient presents with nausea and vomiting.  No acute pain complaints.  No fever.  She is nontoxic in appearance with unremarkable vital signs.  Not septic. ? ?Lab panel is all unremarkable except for CBC which shows pancytopenia with white count of 3000, hemoglobin of 10.4, platelets of 50,000.  This is in contrast to CBC 6 months ago which was entirely normal.  I reviewed outside records from rheumatology clinic visit 2 weeks ago with outpatient labs which also did not show this degree of cytopenia. ? ?I discussed the case with hematology Dr. Rogue Bussing who recommends DIC/hemolysis labs.  I will also obtain a repeat CBC in case the first sample was erroneous/diluted. ? ? ?----------------------------------------- ?10:00 PM on 11/20/2021 ?----------------------------------------- ?Repeat CBC shows the same result, confirming pancytopenia.  Not severe enough to require any transfusions.  No evidence of any active blood loss.  DIC labs are normal.  Pathologist smear review pending.  Hematology clinic will reach out to patient in 3 days on Monday for follow-up.  I suspect decreased cell counts is a side effect of her prednisone and methotrexate use. ?  ? ? ?FINAL CLINICAL IMPRESSION(S) / ED DIAGNOSES   ? ?Final diagnoses:  ?Nausea and vomiting, unspecified vomiting type  ?Constipation, unspecified constipation type  ?Pancytopenia ([place])  ? ? ? ?Rx / DC Orders  ? ?ED Discharge Orders   ? ?      Ordered  ?  ondansetron (ZOFRAN-ODT) 4 MG disintegrating tablet  Every 8 hours PRN       ? 11/20/21 2157  ?  polyethylene glycol powder (GLYCOLAX/MIRALAX) 17 GM/SCOOP powder       ?  11/20/21 2157  ?  senna-docusate (SENOKOT-S) 8.6-50 MG tablet  2 times daily       ? 11/20/21 2157  ? ?  ?  ? ?  ? ? ? ?Note:  This document was prepared using Dragon voice recognition software and may include unintentional dictation errors. ?  ?Carrie Mew, MD ?11/20/21 2201 ? ?

## 2021-11-20 NOTE — Discharge Instructions (Addendum)
Your lab tests today showed low blood cell counts, which may be a side effect of your medicine you take for rheumatoid arthritis.  You do not need a transfusion right now, but we did discuss these findings with the hematologist.  Their clinic will call you on Monday to arrange a follow-up appointment.  Your other labs and CT scan today show any other acute issues.  You do have constipation which can be relieved with Senokot and MiraLAX.  Please follow-up with your doctor next week for continued evaluation of your symptoms. ?

## 2021-11-21 NOTE — ED Notes (Signed)
Pt wheeled out to meet ride and helped into car for transport  ?

## 2021-11-22 LAB — HAPTOGLOBIN: Haptoglobin: 199 mg/dL (ref 42–346)

## 2021-11-23 LAB — PATHOLOGIST SMEAR REVIEW

## 2021-12-03 ENCOUNTER — Inpatient Hospital Stay
Admission: EM | Admit: 2021-12-03 | Discharge: 2021-12-08 | DRG: 190 | Disposition: A | Discharge status: Home Health | Payer: Medicare HMO | Attending: Internal Medicine | Admitting: Internal Medicine

## 2021-12-03 ENCOUNTER — Emergency Department: Payer: Medicare HMO

## 2021-12-03 ENCOUNTER — Other Ambulatory Visit: Payer: Self-pay

## 2021-12-03 ENCOUNTER — Encounter: Payer: Self-pay | Admitting: Intensive Care

## 2021-12-03 DIAGNOSIS — Z888 Allergy status to other drugs, medicaments and biological substances status: Secondary | ICD-10-CM

## 2021-12-03 DIAGNOSIS — K047 Periapical abscess without sinus: Secondary | ICD-10-CM

## 2021-12-03 DIAGNOSIS — Z79899 Other long term (current) drug therapy: Secondary | ICD-10-CM

## 2021-12-03 DIAGNOSIS — F17210 Nicotine dependence, cigarettes, uncomplicated: Secondary | ICD-10-CM | POA: Diagnosis present

## 2021-12-03 DIAGNOSIS — J9601 Acute respiratory failure with hypoxia: Secondary | ICD-10-CM

## 2021-12-03 DIAGNOSIS — D75839 Thrombocytosis, unspecified: Secondary | ICD-10-CM

## 2021-12-03 DIAGNOSIS — Z79890 Hormone replacement therapy (postmenopausal): Secondary | ICD-10-CM

## 2021-12-03 DIAGNOSIS — D649 Anemia, unspecified: Secondary | ICD-10-CM

## 2021-12-03 DIAGNOSIS — K2970 Gastritis, unspecified, without bleeding: Secondary | ICD-10-CM | POA: Diagnosis present

## 2021-12-03 DIAGNOSIS — E43 Unspecified severe protein-calorie malnutrition: Secondary | ICD-10-CM | POA: Insufficient documentation

## 2021-12-03 DIAGNOSIS — Z9841 Cataract extraction status, right eye: Secondary | ICD-10-CM

## 2021-12-03 DIAGNOSIS — E440 Moderate protein-calorie malnutrition: Secondary | ICD-10-CM | POA: Diagnosis present

## 2021-12-03 DIAGNOSIS — R54 Age-related physical debility: Secondary | ICD-10-CM | POA: Diagnosis present

## 2021-12-03 DIAGNOSIS — R569 Unspecified convulsions: Secondary | ICD-10-CM

## 2021-12-03 DIAGNOSIS — M961 Postlaminectomy syndrome, not elsewhere classified: Secondary | ICD-10-CM | POA: Diagnosis present

## 2021-12-03 DIAGNOSIS — J441 Chronic obstructive pulmonary disease with (acute) exacerbation: Secondary | ICD-10-CM | POA: Diagnosis present

## 2021-12-03 DIAGNOSIS — Z79891 Long term (current) use of opiate analgesic: Secondary | ICD-10-CM

## 2021-12-03 DIAGNOSIS — Z961 Presence of intraocular lens: Secondary | ICD-10-CM | POA: Diagnosis present

## 2021-12-03 DIAGNOSIS — T451X5A Adverse effect of antineoplastic and immunosuppressive drugs, initial encounter: Secondary | ICD-10-CM | POA: Diagnosis present

## 2021-12-03 DIAGNOSIS — I2721 Secondary pulmonary arterial hypertension: Secondary | ICD-10-CM | POA: Diagnosis present

## 2021-12-03 DIAGNOSIS — Z7982 Long term (current) use of aspirin: Secondary | ICD-10-CM

## 2021-12-03 DIAGNOSIS — R0902 Hypoxemia: Secondary | ICD-10-CM | POA: Diagnosis not present

## 2021-12-03 DIAGNOSIS — M797 Fibromyalgia: Secondary | ICD-10-CM | POA: Diagnosis present

## 2021-12-03 DIAGNOSIS — Y929 Unspecified place or not applicable: Secondary | ICD-10-CM

## 2021-12-03 DIAGNOSIS — K219 Gastro-esophageal reflux disease without esophagitis: Secondary | ICD-10-CM | POA: Diagnosis present

## 2021-12-03 DIAGNOSIS — I2510 Atherosclerotic heart disease of native coronary artery without angina pectoris: Secondary | ICD-10-CM | POA: Diagnosis present

## 2021-12-03 DIAGNOSIS — J439 Emphysema, unspecified: Secondary | ICD-10-CM | POA: Diagnosis not present

## 2021-12-03 DIAGNOSIS — R0600 Dyspnea, unspecified: Principal | ICD-10-CM

## 2021-12-03 DIAGNOSIS — G40909 Epilepsy, unspecified, not intractable, without status epilepticus: Secondary | ICD-10-CM | POA: Diagnosis present

## 2021-12-03 DIAGNOSIS — D638 Anemia in other chronic diseases classified elsewhere: Secondary | ICD-10-CM | POA: Diagnosis present

## 2021-12-03 DIAGNOSIS — I10 Essential (primary) hypertension: Secondary | ICD-10-CM

## 2021-12-03 DIAGNOSIS — Z681 Body mass index (BMI) 19 or less, adult: Secondary | ICD-10-CM

## 2021-12-03 DIAGNOSIS — Z9842 Cataract extraction status, left eye: Secondary | ICD-10-CM

## 2021-12-03 DIAGNOSIS — K648 Other hemorrhoids: Secondary | ICD-10-CM | POA: Diagnosis present

## 2021-12-03 DIAGNOSIS — G894 Chronic pain syndrome: Secondary | ICD-10-CM | POA: Diagnosis present

## 2021-12-03 DIAGNOSIS — E039 Hypothyroidism, unspecified: Secondary | ICD-10-CM | POA: Diagnosis present

## 2021-12-03 DIAGNOSIS — D61811 Other drug-induced pancytopenia: Secondary | ICD-10-CM | POA: Diagnosis present

## 2021-12-03 LAB — CBC
HCT: 30.2 % — ABNORMAL LOW (ref 36.0–46.0)
Hemoglobin: 9.5 g/dL — ABNORMAL LOW (ref 12.0–15.0)
MCH: 29.7 pg (ref 26.0–34.0)
MCHC: 31.5 g/dL (ref 30.0–36.0)
MCV: 94.4 fL (ref 80.0–100.0)
Platelets: 682 10*3/uL — ABNORMAL HIGH (ref 150–400)
RBC: 3.2 MIL/uL — ABNORMAL LOW (ref 3.87–5.11)
RDW: 16.8 % — ABNORMAL HIGH (ref 11.5–15.5)
WBC: 6.3 10*3/uL (ref 4.0–10.5)
nRBC: 0 % (ref 0.0–0.2)

## 2021-12-03 LAB — BASIC METABOLIC PANEL
Anion gap: 8 (ref 5–15)
BUN: 15 mg/dL (ref 8–23)
CO2: 27 mmol/L (ref 22–32)
Calcium: 8.8 mg/dL — ABNORMAL LOW (ref 8.9–10.3)
Chloride: 100 mmol/L (ref 98–111)
Creatinine, Ser: 0.72 mg/dL (ref 0.44–1.00)
GFR, Estimated: >60 mL/min (ref >60)
Glucose, Bld: 104 mg/dL — ABNORMAL HIGH (ref 70–99)
Potassium: 4 mmol/L (ref 3.5–5.1)
Sodium: 135 mmol/L (ref 135–145)

## 2021-12-03 LAB — FOLATE: Folate: 7.6 ng/mL (ref >5.9)

## 2021-12-03 LAB — TROPONIN I (HIGH SENSITIVITY)
Troponin I (High Sensitivity): 6 ng/L (ref <18)
Troponin I (High Sensitivity): 6 ng/L (ref <18)

## 2021-12-03 LAB — FERRITIN: Ferritin: 220 ng/mL (ref 11–307)

## 2021-12-03 LAB — IRON AND TIBC
Iron: 15 ug/dL — ABNORMAL LOW (ref 28–170)
Saturation Ratios: 7 % — ABNORMAL LOW (ref 10.4–31.8)
TIBC: 216 ug/dL — ABNORMAL LOW (ref 250–450)
UIBC: 201 ug/dL

## 2021-12-03 LAB — D-DIMER, QUANTITATIVE: D-Dimer, Quant: 1.6 ug/mL-FEU — ABNORMAL HIGH (ref 0.00–0.50)

## 2021-12-03 MED ORDER — IPRATROPIUM-ALBUTEROL 0.5-2.5 (3) MG/3ML IN SOLN
3.0000 mL | Freq: Once | RESPIRATORY_TRACT | Status: AC
  Administered 2021-12-03: 3 mL via RESPIRATORY_TRACT
  Filled 2021-12-03: qty 3

## 2021-12-03 MED ORDER — ENOXAPARIN SODIUM 40 MG/0.4ML IJ SOSY
40.0000 mg | PREFILLED_SYRINGE | INTRAMUSCULAR | Status: DC
  Administered 2021-12-04: 40 mg via SUBCUTANEOUS
  Filled 2021-12-03: qty 0.4

## 2021-12-03 MED ORDER — SODIUM CHLORIDE 0.9 % IV SOLN: INTRAVENOUS | Status: DC

## 2021-12-03 MED ORDER — PREDNISONE 20 MG PO TABS: 40.0000 mg | ORAL_TABLET | Freq: Every day | ORAL | Status: DC

## 2021-12-03 MED ORDER — IPRATROPIUM-ALBUTEROL 0.5-2.5 (3) MG/3ML IN SOLN
3.0000 mL | Freq: Four times a day (QID) | RESPIRATORY_TRACT | Status: DC
  Administered 2021-12-04 – 2021-12-06 (×9): 3 mL via RESPIRATORY_TRACT
  Filled 2021-12-03 (×10): qty 3

## 2021-12-03 MED ORDER — METHYLPREDNISOLONE SODIUM SUCC 125 MG IJ SOLR
90.0000 mg | Freq: Once | INTRAMUSCULAR | Status: AC
  Administered 2021-12-03: 90 mg via INTRAVENOUS
  Filled 2021-12-03: qty 2

## 2021-12-03 MED ORDER — IOHEXOL 350 MG/ML SOLN
75.0000 mL | Freq: Once | INTRAVENOUS | Status: AC | PRN
  Administered 2021-12-03: 75 mL via INTRAVENOUS

## 2021-12-03 MED ORDER — ONDANSETRON HCL 4 MG PO TABS
4.0000 mg | ORAL_TABLET | Freq: Four times a day (QID) | ORAL | Status: DC | PRN
  Administered 2021-12-07 – 2021-12-08 (×2): 4 mg via ORAL
  Filled 2021-12-03 (×2): qty 1

## 2021-12-03 MED ORDER — ACETAMINOPHEN 325 MG PO TABS: 650.0000 mg | ORAL_TABLET | Freq: Four times a day (QID) | ORAL | Status: DC | PRN

## 2021-12-03 MED ORDER — OXYCODONE HCL 5 MG PO TABS
5.0000 mg | ORAL_TABLET | ORAL | Status: DC | PRN
  Administered 2021-12-04: 5 mg via ORAL
  Filled 2021-12-03 (×2): qty 1

## 2021-12-03 MED ORDER — ACETAMINOPHEN 650 MG RE SUPP: 650.0000 mg | Freq: Four times a day (QID) | RECTAL | Status: DC | PRN

## 2021-12-03 MED ORDER — ONDANSETRON HCL 4 MG/2ML IJ SOLN
4.0000 mg | Freq: Four times a day (QID) | INTRAMUSCULAR | Status: DC | PRN
  Administered 2021-12-08: 4 mg via INTRAVENOUS
  Filled 2021-12-03: qty 2

## 2021-12-03 MED ORDER — ALBUTEROL SULFATE (2.5 MG/3ML) 0.083% IN NEBU: 2.5000 mg | INHALATION_SOLUTION | RESPIRATORY_TRACT | Status: DC | PRN

## 2021-12-03 NOTE — Assessment & Plan Note (Addendum)
Continue aspirin.  Restart metoprolol.

## 2021-12-03 NOTE — Assessment & Plan Note (Addendum)
Patient with BMI of 18.02

## 2021-12-03 NOTE — Assessment & Plan Note (Deleted)
O2 sats initially 88% on room air improving to the 90s with O2.  Now saturating in the 90s on room air posttreatment for COPD CTA chest showing severe emphysema Patient may have underlying chronic respiratory failure Home O2 eval prior to discharge

## 2021-12-03 NOTE — ED Triage Notes (Addendum)
Patient arrived by EMS from home for SOB and low hemoglobin. EMS reports patients O2 was 88% RA. Patient c/o chronic back pain. Patient is hard of hearing

## 2021-12-03 NOTE — Assessment & Plan Note (Addendum)
Continue Solu-Medrol.  Continue nebulizer treatments.

## 2021-12-03 NOTE — ED Provider Notes (Signed)
[facility] Provider Note    Event Date/Time   First MD Initiated Contact with Patient 12/03/21 1850     (approximate)   History   Shortness of Breath   HPI  Diane Adkins is a 76 y.o. female with extensive past medical history of COPD not on home oxygen presents to the ER for evaluation of shortness of breath progressively worsening over the past few days.  Has had intermittent cough.  She denies any chest pain or pressure.  Just feels weak and fatigued.  Reportedly was found to be hypoxic to 88% on room air though she is currently 98% on room air in the ER.  Feels like her COPD is an exacerbation right now.  Denies any leg swelling.  No pain with deep inspiration.     Physical Exam   Triage Vital Signs: ED Triage Vitals  Enc Vitals Group     BP 12/03/21 1720 (!) 116/54     Pulse Rate 12/03/21 1720 69     Resp 12/03/21 1720 (!) 22     Temp 12/03/21 1720 98.2 F (36.8 C)     Temp Source 12/03/21 1720 Oral     SpO2 12/03/21 1720 (!) 88 %     Weight 12/03/21 1721 96 lb (43.5 kg)     Height 12/03/21 1721 '5\' 2"'$  (1.575 m)     Head Circumference --      Peak Flow --      Pain Score 12/03/21 1721 0     Pain Loc --      Pain Edu? --      Excl. in [place]? --     Most recent vital signs: Vitals:   12/03/21 2230 12/03/21 2321  BP: (!) 116/42 (!) 115/54  Pulse: 89 81  Resp:  20  Temp:  97.9 F (36.6 C)  SpO2: 98% 100%     Constitutional: Alert  Eyes: Conjunctivae are normal.  Head: Atraumatic. Nose: No congestion/rhinnorhea. Mouth/Throat: Mucous membranes are moist.   Neck: Painless ROM.  Cardiovascular:   Good peripheral circulation. No m/g/r Respiratory: Normal respiratory effort.  No retractions.  Gastrointestinal: Soft and nontender.  Musculoskeletal:  no deformity Neurologic:  MAE spontaneously. No gross focal neurologic deficits are appreciated.  Skin:  Skin is warm, dry and intact. No rash noted. Psychiatric: Mood and affect are normal.  Speech and behavior are normal.    ED Results / Procedures / Treatments   Labs (all labs ordered are listed, but only abnormal results are displayed) Labs Reviewed  BASIC METABOLIC PANEL - Abnormal; Notable for the following components:      Result Value   Glucose, Bld 104 (*)    Calcium 8.8 (*)    All other components within normal limits  CBC - Abnormal; Notable for the following components:   RBC 3.20 (*)    Hemoglobin 9.5 (*)    HCT 30.2 (*)    RDW 16.8 (*)    Platelets 682 (*)    All other components within normal limits  D-DIMER, QUANTITATIVE - Abnormal; Notable for the following components:   D-Dimer, Quant 1.60 (*)    All other components within normal limits  IRON AND TIBC - Abnormal; Notable for the following components:   Iron 15 (*)    TIBC 216 (*)    Saturation Ratios 7 (*)    All other components within normal limits  FOLATE  FERRITIN  CBC  BASIC METABOLIC PANEL  VITAMIN N47  TYPE AND SCREEN  TROPONIN I (HIGH SENSITIVITY)  TROPONIN I (HIGH SENSITIVITY)     EKG  ED ECG REPORT I, Merlyn Lot, the attending physician, personally viewed and interpreted this ECG.   Date: 12/03/2021  EKG Time: 17:26  Rate: 75  Rhythm: afib  Axis: normal  Intervals: normal  ST&T Change: no stemi, no depression    RADIOLOGY Please see ED Course for my review and interpretation.  I personally reviewed all radiographic images ordered to evaluate for the above acute complaints and reviewed radiology reports and findings.  These findings were personally discussed with the patient.  Please see medical record for radiology report.    PROCEDURES:  Critical Care performed: No  Procedures   MEDICATIONS ORDERED IN ED: Medications  enoxaparin (LOVENOX) injection 40 mg (has no administration in time range)  acetaminophen (TYLENOL) tablet 650 mg (has no administration in time range)    Or  acetaminophen (TYLENOL) suppository 650 mg (has no administration in time  range)  ondansetron (ZOFRAN) tablet 4 mg (has no administration in time range)    Or  ondansetron (ZOFRAN) injection 4 mg (has no administration in time range)  predniSONE (DELTASONE) tablet 40 mg (has no administration in time range)  ipratropium-albuterol (DUONEB) 0.5-2.5 (3) MG/3ML nebulizer solution 3 mL (has no administration in time range)  albuterol (PROVENTIL) (2.5 MG/3ML) 0.083% nebulizer solution 2.5 mg (has no administration in time range)  0.9 %  sodium chloride infusion (has no administration in time range)  oxyCODONE (Oxy IR/ROXICODONE) immediate release tablet 5 mg (has no administration in time range)  ipratropium-albuterol (DUONEB) 0.5-2.5 (3) MG/3ML nebulizer solution 3 mL (3 mLs Nebulization Given 12/03/21 1932)  ipratropium-albuterol (DUONEB) 0.5-2.5 (3) MG/3ML nebulizer solution 3 mL (3 mLs Nebulization Given 12/03/21 1932)  methylPREDNISolone sodium succinate (SOLU-MEDROL) 125 mg/2 mL injection 90 mg (90 mg Intravenous Given 12/03/21 1948)  iohexol (OMNIPAQUE) 350 MG/ML injection 75 mL (75 mLs Intravenous Contrast Given 12/03/21 2040)  ipratropium-albuterol (DUONEB) 0.5-2.5 (3) MG/3ML nebulizer solution 3 mL (3 mLs Nebulization Given 12/03/21 2147)     IMPRESSION / MDM / ASSESSMENT AND PLAN / ED COURSE  I reviewed the triage vital signs and the nursing notes.                              Differential diagnosis includes, but is not limited to, Asthma, copd, CHF, pna, ptx, malignancy, Pe, anemia  Patient presenting to the ER for evaluation of shortness of breath chest discomfort as described above.  Patient has severe emphysema with diminished breath sounds throughout faint and expiratory wheezing described above.  Reportedly hypoxic but satting well on room air currently. This presenting complaint could reflect a potentially life-threatening illness therefore the patient will be placed on continuous pulse oximetry and telemetry for monitoring.  Laboratory evaluation will be  sent to evaluate for the above complaints.     Clinical Course as of 12/03/21 2332  Thu Dec 03, 2021  1959 Chest x-ray on my interpretation without evidence of infiltrate or consolidation. [PR]  2020 D-dimer is elevated will order CTA to further evaluate. [PR]  2118 Patient reassessed.  Still feels somewhat short of breath.  She is not requiring oxygen but there was a's episode of hypoxia earlier.  She lives at home alone.  She does not feel safe returning home tonight given her severe emphysema state I think is reasonable to observe her in the hospital. [PR]  Clinical Course User Index [PR] Merlyn Lot, MD    Patient's presentation is most consistent with acute presentation with potential threat to life or bodily function.   FINAL CLINICAL IMPRESSION(S) / ED DIAGNOSES   Final diagnoses:  Dyspnea, unspecified type     Rx / DC Orders   ED Discharge Orders     None        Note:  This document was prepared using Dragon voice recognition software and may include unintentional dictation errors.    Merlyn Lot, MD 12/03/21 [id number]

## 2021-12-03 NOTE — Assessment & Plan Note (Signed)
Continue Keppra 500 twice daily

## 2021-12-03 NOTE — H&P (Incomplete)
History and Physical    Patient: Diane Adkins [id number] DOB: [birth year] DOA: 12/03/2021 DOS: the patient was seen and examined on 12/03/2021 PCP: Leonel Ramsay, MD  Patient coming from: Home  Chief Complaint:  Chief Complaint  Patient presents with  . Shortness of Breath    HPI: Diane Adkins is a 76 y.o. female with medical history significant for HTN, hypothyroidism, chronic pain on chronic opiates, CAD, seizure disorder and COPD who presents to the ED by EMS with shortness of breath and low back pain.  EMS reported O2 sat of 88% on room air.  Patient has been using her inhalers but getting inadequate relief.  She denies fever chills or chest pain. ED course and data review: On arrival afebrile, tachypneic to 22 with O2 sat 88% on room air.  Pulse 69 and BP 116/54.  Labs significant for hemoglobin of 9.5, down from 10.6  2 weeks prior and 14.9 about 6 months ago.  Troponin 6, D-dimer 1.60.  EKG, personally viewed and interpreted showing A-fib at 77 with nonspecific ST-T wave changes.  CT angio  chest negative for PE but showing severe emphysema and extensive multivessel coronary artery calcification. Patient treated with DuoNebs x3, Solu-Medrol, with improvement in O2 sat to the mid to high 90s on room air however as patient lives alone and felt discomfort being discharged home, hospitalist consulted for admission.   Review of Systems: As mentioned in the history of present illness. All other systems reviewed and are negative. Past Medical History:  Diagnosis Date  . Arthritis    ra  . Cardiomegaly   . Cataract   . COPD (chronic obstructive pulmonary disease) ([place])   . Coronary artery disease   . Depression   . Diverticulitis   . Epilepsy ([place])   . Fibromyalgia   . GERD (gastroesophageal reflux disease)   . HOH (hard of hearing)    aids  . Hypertension   . Hypothyroidism   . Inflammatory bowel disease   . Lupus ([place])   . 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 MRSA  (methicillin resistant staph aureus) culture positive 1990's  . Opioid dependence ([place])   . Pain    chronic back and neck  . Seizures ([place])    last 2016  . Spinal disorder    stenosis  . Thyroid disease    hypothyroid   Past Surgical History:  Procedure Laterality Date  . ABDOMINAL HYSTERECTOMY    . BACK SURGERY     [age]   cervical fusion/thoracic/lumbar with ray cages  . BREAST SURGERY     implants  . cardiac stents  2013   x 2  . CATARACT EXTRACTION W/PHACO Left 03/30/2016   Procedure: CATARACT EXTRACTION PHACO AND INTRAOCULAR LENS PLACEMENT (IOC);  Surgeon: Birder Robson, MD;  Location: [facility] ORS;  Service: Ophthalmology;  Laterality: Left;  [place] 00:45 AP% 19.6 CDE 8.93 Fluid pack lot # [id number]  . CATARACT EXTRACTION W/PHACO Right 04/27/2016   Procedure: CATARACT EXTRACTION PHACO AND INTRAOCULAR LENS PLACEMENT (IOC);  Surgeon: Birder Robson, MD;  Location: [facility] ORS;  Service: Ophthalmology;  Laterality: Right;  Lot #[id number] H [place]: 00:39.1 AP%: 17.3 CDE: 6.76  . CERVICAL SPINE SURGERY    . CORONARY ANGIOPLASTY     stent x2  . EYE SURGERY    . JOINT REPLACEMENT Left 2007   hip  . rotator cuff surg Right   . TOTAL HIP REVISION Left 01/24/2020   Procedure: TOTAL HIP REVISION;  Surgeon: Lovell Sheehan,  MD;  Location: [facility] ORS;  Service: Orthopedics;  Laterality: Left;   Social History:  reports that she has been smoking cigarettes. She has been smoking an average of .5 packs per day. She has never used smokeless tobacco. She reports that she does not drink alcohol and does not use drugs.  Allergies  Allergen Reactions  . Keflex [Cephalexin] Other (See Comments) and Hives    Reaction: unknown  . Beef-Derived Products     malaise  . Citrus     Orange juice/vomiting  . Lamotrigine Itching and Swelling  . Macrodantin [Nitrofurantoin Macrocrystal] Other (See Comments)    Reaction: unknown  . Nitrofurantoin Hives  . Statins Hives  . Fosamax [Alendronate Sodium] Hives     Family History  Problem Relation Age of Onset  . Heart disease Mother   . Cancer Father     Prior to Admission medications   Medication Sig Start Date End Date Taking? Authorizing Provider  albuterol (VENTOLIN HFA) 108 (90 Base) MCG/ACT inhaler Inhale 2 puffs into the lungs every 4 (four) hours as needed for wheezing or shortness of breath.     [provider]  aspirin 81 MG chewable tablet Chew 1 tablet (81 mg total) by mouth 2 (two) times daily. 01/25/20   Lovell Sheehan, MD  azithromycin (ZITHROMAX) 250 MG tablet Daily for 4 days. MAY ADMINISTER WITHOUT REGARD TO MEALS 05/21/21   Richarda Osmond, MD  calcium carbonate 1250 MG capsule Take 1,250 mg by mouth daily.     [provider]  dextromethorphan-guaiFENesin (MUCINEX DM) 30-600 MG 12hr tablet Take 1 tablet by mouth 2 (two) times daily as needed for cough. 05/20/21   Richarda Osmond, MD  levETIRAcetam (KEPPRA) 500 MG tablet Take 500 mg by mouth 2 (two) times daily.  02/03/19   [provider]  levothyroxine (SYNTHROID) 75 MCG tablet Take 75 mcg by mouth daily before breakfast. Brand Name Only    [provider]  methocarbamol (ROBAXIN) 500 MG tablet Take 500 mg by mouth in the morning and at bedtime.    [provider]  metoprolol tartrate (LOPRESSOR) 25 MG tablet Take 25 mg by mouth 2 (two) times daily.    [provider]  mometasone-formoterol (DULERA) 100-5 MCG/ACT AERO Inhale 2 puffs into the lungs 2 (two) times daily. 05/20/21   Richarda Osmond, MD  omeprazole (PRILOSEC) 20 MG capsule Take 20 mg by mouth daily as needed (acid reflux).     [provider]  ondansetron (ZOFRAN-ODT) 4 MG disintegrating tablet Take 1 tablet (4 mg total) by mouth every 8 (eight) hours as needed for nausea or vomiting. 11/20/21   Carrie Mew, MD  oxyCODONE (ROXICODONE) 15 MG immediate release tablet Take 15 mg by mouth in the morning, at noon, in the evening, and at bedtime.   11/22/19   [provider]  polyethylene glycol powder (GLYCOLAX/MIRALAX) 17 GM/SCOOP powder 1 cap full in a full glass of water, two times a day for 3 days. 11/20/21   Carrie Mew, MD  pregabalin (LYRICA) 50 MG capsule Take 50 mg by mouth 2 (two) times daily. 05/12/21   [provider]  senna-docusate (SENOKOT-S) 8.6-50 MG tablet Take 2 tablets by mouth 2 (two) times daily. 11/20/21   Carrie Mew, MD    Physical Exam: Vitals:   12/03/21 1721 12/03/21 1725 12/03/21 1854 12/03/21 1857  BP:   (!) 106/46   Pulse:   66 71  Resp:   14 13  Temp:      TempSrc:      SpO2:  100% 100% 100%  Weight: 43.5 kg     Height: '5\' 2"'$  (1.575 m)      Physical Exam   Data Reviewed: Relevant notes from primary care and specialist visits, past discharge summaries as available in EHR, including Care Everywhere. Prior diagnostic testing as pertinent to current admission diagnoses Updated medications and problem lists for reconciliation ED course, including vitals, labs, imaging, treatment and response to treatment Triage notes, nursing and pharmacy notes and ED provider's notes Notable results as noted in HPI   Assessment and Plan: * COPD exacerbation ([place]) Schedule and as needed nebulized bronchodilator treatments IV steroids transition to oral Antitussives and supportive care Supplemental oxygen as needed to keep sats over 90  Hypoxemia O2 sats initially 88% on room air improving to the 90s with O2.  Now saturating in the 90s on room air posttreatment for COPD CTA chest showing severe emphysema Patient may have underlying chronic respiratory failure Home O2 eval prior to discharge  Anemia hemoglobin of 9.5, down from 10.6 2 weeks prior and 15 about 6 months ago Follow anemia panel  Chronic pain syndrome At baseline Continue oxycodone, Lyrica and Robaxin  Protein-calorie malnutrition, moderate (HCC) Patient with BMI of 17.56 Nutritionist evaluation  requested  CAD (coronary artery disease) No complaints of chest pain, EKG nonacute and first troponin negative Continue aspirin.  Holding metoprolol due to soft blood pressure.  Not currently on statins  Hypertension BP somewhat soft at 106/46 so will hold home metoprolol for tonight and resume when appropriate  Hypothyroidism Continue levothyroxine 75 mg daily  Seizures (HCC) Continue Keppra 500 twice daily   Advance Care Planning:   Code Status: Prior   Consults: none  Family Communication: none  Severity of Illness: The appropriate patient status for this patient is OBSERVATION. Observation status is judged to be reasonable and necessary in order to provide the required intensity of service to ensure the patient's safety. The patient's presenting symptoms, physical exam findings, and initial radiographic and laboratory data in the context of their medical condition is felt to place them at decreased risk for further clinical deterioration. Furthermore, it is anticipated that the patient will be medically stable for discharge from the hospital within 2 midnights of admission.   Author: Athena Masse, MD 12/03/2021 9:56 PM  For on call review [email or web address].

## 2021-12-03 NOTE — Assessment & Plan Note (Addendum)
Restart metoprolol.

## 2021-12-03 NOTE — ED Notes (Signed)
Patient reports pain pills is missing from pocket. Bottle of pain pills was not seen at anytime by RN

## 2021-12-03 NOTE — Assessment & Plan Note (Deleted)
At baseline Continue oxycodone, Lyrica and Robaxin

## 2021-12-03 NOTE — Assessment & Plan Note (Signed)
Continue levothyroxine 75 mg daily

## 2021-12-03 NOTE — Assessment & Plan Note (Addendum)
Hemoglobin dropped down to 7.9.  Was 13.1 as outpatient last month.  Ferritin up at 220 goes along with anemia of chronic disease.  Initially had pancytopenia as outpatient which could be methotrexate toxicity.  I started oral folic acid which could increase the platelet count.  Hemoglobin responded to blood transfusion up to 10.4 and likely will settle down tomorrow.

## 2021-12-04 DIAGNOSIS — J441 Chronic obstructive pulmonary disease with (acute) exacerbation: Secondary | ICD-10-CM

## 2021-12-04 DIAGNOSIS — R54 Age-related physical debility: Secondary | ICD-10-CM | POA: Diagnosis present

## 2021-12-04 DIAGNOSIS — J9601 Acute respiratory failure with hypoxia: Secondary | ICD-10-CM | POA: Diagnosis present

## 2021-12-04 DIAGNOSIS — D649 Anemia, unspecified: Secondary | ICD-10-CM | POA: Diagnosis not present

## 2021-12-04 DIAGNOSIS — G894 Chronic pain syndrome: Secondary | ICD-10-CM | POA: Diagnosis present

## 2021-12-04 DIAGNOSIS — Z9841 Cataract extraction status, right eye: Secondary | ICD-10-CM | POA: Diagnosis not present

## 2021-12-04 DIAGNOSIS — J439 Emphysema, unspecified: Secondary | ICD-10-CM | POA: Diagnosis present

## 2021-12-04 DIAGNOSIS — E440 Moderate protein-calorie malnutrition: Secondary | ICD-10-CM | POA: Diagnosis present

## 2021-12-04 DIAGNOSIS — T451X5A Adverse effect of antineoplastic and immunosuppressive drugs, initial encounter: Secondary | ICD-10-CM | POA: Diagnosis present

## 2021-12-04 DIAGNOSIS — K648 Other hemorrhoids: Secondary | ICD-10-CM | POA: Diagnosis present

## 2021-12-04 DIAGNOSIS — K219 Gastro-esophageal reflux disease without esophagitis: Secondary | ICD-10-CM | POA: Diagnosis present

## 2021-12-04 DIAGNOSIS — Z9842 Cataract extraction status, left eye: Secondary | ICD-10-CM | POA: Diagnosis not present

## 2021-12-04 DIAGNOSIS — K2970 Gastritis, unspecified, without bleeding: Secondary | ICD-10-CM | POA: Diagnosis present

## 2021-12-04 DIAGNOSIS — G40909 Epilepsy, unspecified, not intractable, without status epilepticus: Secondary | ICD-10-CM | POA: Diagnosis present

## 2021-12-04 DIAGNOSIS — D61811 Other drug-induced pancytopenia: Secondary | ICD-10-CM | POA: Diagnosis present

## 2021-12-04 DIAGNOSIS — Y929 Unspecified place or not applicable: Secondary | ICD-10-CM | POA: Diagnosis not present

## 2021-12-04 DIAGNOSIS — I2510 Atherosclerotic heart disease of native coronary artery without angina pectoris: Secondary | ICD-10-CM

## 2021-12-04 DIAGNOSIS — K047 Periapical abscess without sinus: Secondary | ICD-10-CM | POA: Diagnosis present

## 2021-12-04 DIAGNOSIS — I2721 Secondary pulmonary arterial hypertension: Secondary | ICD-10-CM | POA: Diagnosis present

## 2021-12-04 DIAGNOSIS — E039 Hypothyroidism, unspecified: Secondary | ICD-10-CM | POA: Diagnosis present

## 2021-12-04 DIAGNOSIS — Z961 Presence of intraocular lens: Secondary | ICD-10-CM | POA: Diagnosis present

## 2021-12-04 DIAGNOSIS — D638 Anemia in other chronic diseases classified elsewhere: Secondary | ICD-10-CM | POA: Diagnosis present

## 2021-12-04 DIAGNOSIS — E43 Unspecified severe protein-calorie malnutrition: Secondary | ICD-10-CM | POA: Diagnosis present

## 2021-12-04 DIAGNOSIS — R569 Unspecified convulsions: Secondary | ICD-10-CM

## 2021-12-04 DIAGNOSIS — Z79891 Long term (current) use of opiate analgesic: Secondary | ICD-10-CM | POA: Diagnosis not present

## 2021-12-04 DIAGNOSIS — E038 Other specified hypothyroidism: Secondary | ICD-10-CM

## 2021-12-04 DIAGNOSIS — Z681 Body mass index (BMI) 19 or less, adult: Secondary | ICD-10-CM | POA: Diagnosis not present

## 2021-12-04 DIAGNOSIS — M961 Postlaminectomy syndrome, not elsewhere classified: Secondary | ICD-10-CM

## 2021-12-04 DIAGNOSIS — M797 Fibromyalgia: Secondary | ICD-10-CM | POA: Diagnosis present

## 2021-12-04 DIAGNOSIS — R0902 Hypoxemia: Secondary | ICD-10-CM | POA: Diagnosis present

## 2021-12-04 DIAGNOSIS — I10 Essential (primary) hypertension: Secondary | ICD-10-CM

## 2021-12-04 DIAGNOSIS — D75839 Thrombocytosis, unspecified: Secondary | ICD-10-CM | POA: Diagnosis present

## 2021-12-04 LAB — CBC
HCT: 26.5 % — ABNORMAL LOW (ref 36.0–46.0)
Hemoglobin: 8.6 g/dL — ABNORMAL LOW (ref 12.0–15.0)
MCH: 29.8 pg (ref 26.0–34.0)
MCHC: 32.5 g/dL (ref 30.0–36.0)
MCV: 91.7 fL (ref 80.0–100.0)
Platelets: 614 10*3/uL — ABNORMAL HIGH (ref 150–400)
RBC: 2.89 MIL/uL — ABNORMAL LOW (ref 3.87–5.11)
RDW: 16.4 % — ABNORMAL HIGH (ref 11.5–15.5)
WBC: 3 10*3/uL — ABNORMAL LOW (ref 4.0–10.5)
nRBC: 0 % (ref 0.0–0.2)

## 2021-12-04 LAB — BASIC METABOLIC PANEL
Anion gap: 9 (ref 5–15)
BUN: 18 mg/dL (ref 8–23)
CO2: 27 mmol/L (ref 22–32)
Calcium: 8.7 mg/dL — ABNORMAL LOW (ref 8.9–10.3)
Chloride: 104 mmol/L (ref 98–111)
Creatinine, Ser: 0.9 mg/dL (ref 0.44–1.00)
GFR, Estimated: >60 mL/min (ref >60)
Glucose, Bld: 209 mg/dL — ABNORMAL HIGH (ref 70–99)
Potassium: 3.6 mmol/L (ref 3.5–5.1)
Sodium: 140 mmol/L (ref 135–145)

## 2021-12-04 LAB — VITAMIN B12: Vitamin B-12: 4427 pg/mL — ABNORMAL HIGH (ref 180–914)

## 2021-12-04 MED ORDER — METHOCARBAMOL 500 MG PO TABS
500.0000 mg | ORAL_TABLET | Freq: Two times a day (BID) | ORAL | Status: DC | PRN
  Filled 2021-12-04: qty 1

## 2021-12-04 MED ORDER — METHYLPREDNISOLONE SODIUM SUCC 40 MG IJ SOLR
40.0000 mg | Freq: Every day | INTRAMUSCULAR | Status: DC
  Administered 2021-12-04 – 2021-12-08 (×5): 40 mg via INTRAVENOUS
  Filled 2021-12-04 (×5): qty 1

## 2021-12-04 MED ORDER — PREGABALIN 50 MG PO CAPS
50.0000 mg | ORAL_CAPSULE | Freq: Two times a day (BID) | ORAL | Status: DC
Start: 2021-12-04 — End: 2021-12-09
  Administered 2021-12-04 – 2021-12-08 (×9): 50 mg via ORAL
  Filled 2021-12-04 (×9): qty 1

## 2021-12-04 MED ORDER — ENOXAPARIN SODIUM 30 MG/0.3ML IJ SOSY
30.0000 mg | PREFILLED_SYRINGE | INTRAMUSCULAR | Status: DC
  Administered 2021-12-04 – 2021-12-05 (×2): 30 mg via SUBCUTANEOUS
  Filled 2021-12-04 (×2): qty 0.3

## 2021-12-04 MED ORDER — ENSURE ENLIVE PO LIQD
237.0000 mL | Freq: Three times a day (TID) | ORAL | Status: DC
  Administered 2021-12-04 – 2021-12-06 (×6): 237 mL via ORAL

## 2021-12-04 MED ORDER — METRONIDAZOLE 500 MG PO TABS: 500.0000 mg | ORAL_TABLET | Freq: Two times a day (BID) | ORAL | Status: DC

## 2021-12-04 MED ORDER — ADULT MULTIVITAMIN W/MINERALS CH
1.0000 | ORAL_TABLET | Freq: Every day | ORAL | Status: DC
  Administered 2021-12-04 – 2021-12-08 (×5): 1 via ORAL
  Filled 2021-12-04 (×5): qty 1

## 2021-12-04 MED ORDER — MIRTAZAPINE 15 MG PO TABS
15.0000 mg | ORAL_TABLET | Freq: Every day | ORAL | Status: DC
  Administered 2021-12-04 – 2021-12-07 (×4): 15 mg via ORAL
  Filled 2021-12-04 (×4): qty 1

## 2021-12-04 MED ORDER — LEVETIRACETAM 500 MG PO TABS
500.0000 mg | ORAL_TABLET | Freq: Two times a day (BID) | ORAL | Status: DC
  Administered 2021-12-04 – 2021-12-08 (×9): 500 mg via ORAL
  Filled 2021-12-04 (×9): qty 1

## 2021-12-04 MED ORDER — FOLIC ACID 1 MG PO TABS
1.0000 mg | ORAL_TABLET | Freq: Every day | ORAL | Status: DC
  Administered 2021-12-04 – 2021-12-07 (×4): 1 mg via ORAL
  Filled 2021-12-04 (×4): qty 1

## 2021-12-04 MED ORDER — OXYCODONE HCL 5 MG PO TABS
15.0000 mg | ORAL_TABLET | ORAL | Status: DC | PRN
  Administered 2021-12-04 – 2021-12-08 (×14): 15 mg via ORAL
  Filled 2021-12-04 (×14): qty 3

## 2021-12-04 MED ORDER — PANTOPRAZOLE SODIUM 40 MG PO TBEC
40.0000 mg | DELAYED_RELEASE_TABLET | Freq: Every day | ORAL | Status: DC
  Administered 2021-12-04 – 2021-12-08 (×5): 40 mg via ORAL
  Filled 2021-12-04 (×5): qty 1

## 2021-12-04 MED ORDER — LEVOTHYROXINE SODIUM 88 MCG PO TABS
88.0000 ug | ORAL_TABLET | Freq: Every day | ORAL | Status: DC
  Administered 2021-12-04 – 2021-12-07 (×4): 88 ug via ORAL
  Filled 2021-12-04 (×4): qty 1

## 2021-12-04 MED ORDER — METOPROLOL TARTRATE 25 MG PO TABS
25.0000 mg | ORAL_TABLET | Freq: Two times a day (BID) | ORAL | Status: DC
  Administered 2021-12-04 – 2021-12-08 (×9): 25 mg via ORAL
  Filled 2021-12-04 (×9): qty 1

## 2021-12-04 MED ORDER — SODIUM CHLORIDE 0.9 % IV SOLN
3.0000 g | Freq: Four times a day (QID) | INTRAVENOUS | Status: DC
  Administered 2021-12-04 – 2021-12-07 (×13): 3 g via INTRAVENOUS
  Filled 2021-12-04: qty 3
  Filled 2021-12-04: qty 8
  Filled 2021-12-04 (×2): qty 3
  Filled 2021-12-04: qty 8
  Filled 2021-12-04 (×2): qty 3
  Filled 2021-12-04 (×2): qty 8
  Filled 2021-12-04 (×7): qty 3

## 2021-12-04 MED ORDER — SENNOSIDES-DOCUSATE SODIUM 8.6-50 MG PO TABS
2.0000 | ORAL_TABLET | Freq: Two times a day (BID) | ORAL | Status: DC
  Administered 2021-12-04 – 2021-12-07 (×8): 2 via ORAL
  Filled 2021-12-04 (×8): qty 2

## 2021-12-04 MED ORDER — PAROXETINE HCL 10 MG PO TABS
10.0000 mg | ORAL_TABLET | Freq: Every day | ORAL | Status: DC
  Administered 2021-12-04 – 2021-12-08 (×5): 10 mg via ORAL
  Filled 2021-12-04 (×5): qty 1

## 2021-12-04 MED ORDER — TRAZODONE HCL 50 MG PO TABS
50.0000 mg | ORAL_TABLET | Freq: Every evening | ORAL | Status: DC | PRN
  Administered 2021-12-04 – 2021-12-06 (×3): 50 mg via ORAL
  Filled 2021-12-04 (×3): qty 1

## 2021-12-04 MED ORDER — HYDROXYCHLOROQUINE SULFATE 200 MG PO TABS
200.0000 mg | ORAL_TABLET | Freq: Every day | ORAL | Status: DC
  Administered 2021-12-04 – 2021-12-08 (×5): 200 mg via ORAL
  Filled 2021-12-04 (×5): qty 1

## 2021-12-04 NOTE — Assessment & Plan Note (Signed)
On chronic oxycodone '15mg'$  qid. Followed by [facility] Continue  Lyrica and Robaxin

## 2021-12-04 NOTE — Progress Notes (Signed)
PHARMACIST - PHYSICIAN COMMUNICATION  CONCERNING:  Enoxaparin (Lovenox) for DVT Prophylaxis   DESCRIPTION: Patient was prescribed enoxaprin '40mg'$  q24 hours for VTE prophylaxis.   Filed Weights   12/03/21 1721 12/03/21 2321  Weight: 43.5 kg (96 lb) 44.7 kg (98 lb 8.7 oz)    Body mass index is 18.02 kg/m.  Estimated Creatinine Clearance: 38.1 mL/min (by C-G formula based on SCr of 0.9 mg/dL).   Patient is candidate for enoxaparin '30mg'$  every 24 hours based on CrCl <48m/min or Weight <45kg  RECOMMENDATION: Pharm[facility]  Patient is now receiving enoxaparin 30 mg every 24 hours    MDarnelle Bos PharmD Clinical Pharmacist  12/04/2021 8:19 AM

## 2021-12-04 NOTE — Assessment & Plan Note (Addendum)
O2 sats initially 88% on room air.  Patient was back on oxygen this morning for some reason.  Check pulse ox on room air.

## 2021-12-04 NOTE — Care Management (Signed)
[place] from [facility] notified of  [place] needs

## 2021-12-04 NOTE — H&P (Signed)
History and Physical    Patient: Diane Adkins [id number] DOB: 1945-10-14 DOA: 12/03/2021 DOS: the patient was seen and examined on 12/04/2021 PCP: Leonel Ramsay, MD  Patient coming from: Home  Chief Complaint:  Chief Complaint  Patient presents with   Shortness of Breath    HPI: Diane Adkins is a 76 y.o. 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  EMS reported O2 sat of 88% on room air.  Patient has been using her inhalers but getting inadequate relief.  She denies fever chills or chest pain. ED course and data review: On arrival afebrile, tachypneic to 22 with O2 sat 88% on room air.  Pulse 69 and BP 116/54.  Labs significant for hemoglobin of 9.5, down from 10.6  2 weeks prior and 14.9 about 6 months ago.  Troponin 6, D-dimer 1.60.  EKG, personally viewed and interpreted showing A-fib at 77 with nonspecific ST-T wave changes.  CT angio  chest negative for PE but showing severe emphysema and extensive multivessel coronary artery calcification. Patient treated with DuoNebs x3, Solu-Medrol, with improvement in O2 sat to the mid to high 90s on room air however as patient lives alone and felt discomfort being discharged home, hospitalist consulted for admission.   Review of Systems: As mentioned in the history of present illness. All other systems reviewed and are negative. 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    MRSA (methicillin resistant staph aureus) culture positive 1990's   Opioid dependence ([place])    Pain    chronic back and neck   Seizures ([place])    last 2016   Spinal disorder    stenosis   Thyroid disease    hypothyroid   Past Surgical History:  Procedure Laterality Date   ABDOMINAL HYSTERECTOMY     BACK SURGERY     [age]   cervical fusion/thoracic/lumbar with ray cages   BREAST SURGERY     implants   cardiac stents  2013   x 2   CATARACT EXTRACTION W/PHACO Left 03/30/2016   Procedure: CATARACT EXTRACTION PHACO AND INTRAOCULAR LENS PLACEMENT ([place]);  Surgeon: Birder Robson, MD;  Location: [facility] ORS;  Service: Ophthalmology;  Laterality: Left;  [place] 00:45 AP% 19.6 CDE 8.93 Fluid pack lot # [id number]   CATARACT EXTRACTION W/PHACO Right 04/27/2016   Procedure: CATARACT EXTRACTION PHACO AND INTRAOCULAR LENS PLACEMENT ([facility]);  Surgeon: Birder Robson, MD;  Location: [facility] ORS;  Service: Ophthalmology;  Laterality: Right;  Lot #[id number] H [place]: 00:39.1 AP%: 17.3 CDE: 6.76   CERVICAL SPINE SURGERY     CORONARY ANGIOPLASTY     stent x2   EYE SURGERY     JOINT REPLACEMENT Left 2007   hip   rotator cuff surg Right    TOTAL HIP REVISION Left 01/24/2020   Procedure: TOTAL HIP REVISION;  Surgeon: Lovell Sheehan, MD;  Location: [facility] ORS;  Service: Orthopedics;  Laterality: Left;   Social History:  reports that she has been smoking cigarettes. She has been smoking an average of .5 packs per day. She has never used smokeless tobacco. She reports that she does not drink alcohol and does not use drugs.  Allergies  Allergen Reactions   Keflex [Cephalexin] Other (See Comments) and Hives    Reaction: unknown   Beef-Derived Products     malaise   Citrus     Orange juice/vomiting   Lamotrigine Itching and Swelling   Macrodantin [Nitrofurantoin Macrocrystal] Other (See Comments)    Reaction: unknown   Nitrofurantoin Hives   Statins Hives   Fosamax [Alendronate Sodium] Hives    Family History   Problem Relation Age of Onset   Heart disease Mother    Cancer Father     Prior to Admission medications   Medication Sig Start Date End Date Taking? Authorizing Provider  albuterol (VENTOLIN HFA) 108 (90 Base) MCG/ACT inhaler Inhale 2 puffs into the lungs every 4 (four) hours as needed for wheezing or shortness of breath.     [provider]  aspirin 81 MG chewable tablet Chew 1 tablet (81 mg total) by mouth 2 (two) times daily. 01/25/20   Lovell Sheehan, MD  azithromycin (ZITHROMAX) 250 MG tablet Daily for 4 days. MAY ADMINISTER WITHOUT REGARD TO MEALS 05/21/21   Richarda Osmond, MD  calcium carbonate 1250 MG capsule Take 1,250 mg by mouth daily.     [provider]  dextromethorphan-guaiFENesin (MUCINEX DM) 30-600 MG 12hr tablet Take 1 tablet by mouth 2 (two) times daily as needed for cough. 05/20/21   Richarda Osmond, MD  levETIRAcetam (KEPPRA) 500 MG tablet Take 500 mg by mouth 2 (two) times daily.  02/03/19   [provider]  levothyroxine (SYNTHROID) 75 MCG tablet Take 75 mcg by mouth daily before breakfast. Brand Name Only    [provider]  methocarbamol (ROBAXIN) 500 MG tablet Take 500 mg by mouth in the morning and at bedtime.    [provider]  metoprolol tartrate (LOPRESSOR) 25 MG tablet Take 25 mg by mouth 2 (two) times daily.    [provider]  mometasone-formoterol (DULERA) 100-5 MCG/ACT AERO Inhale 2 puffs into the lungs 2 (two) times daily. 05/20/21   Richarda Osmond, MD  omeprazole (PRILOSEC) 20 MG capsule Take 20 mg by mouth daily as needed (acid reflux).     [provider]  ondansetron (ZOFRAN-ODT) 4 MG disintegrating tablet Take 1 tablet (4 mg total) by mouth every 8 (eight) hours as needed for nausea or vomiting. 11/20/21   Carrie Mew, MD  oxyCODONE (ROXICODONE) 15 MG immediate release tablet Take 15 mg by mouth in the morning, at noon, in the evening, and at bedtime.  11/22/19   [provider]  polyethylene glycol powder (GLYCOLAX/MIRALAX) 17 GM/SCOOP powder 1 cap full in a full glass of water, two times a day for 3 days. 11/20/21   Carrie Mew, MD  pregabalin (LYRICA) 50 MG capsule Take 50 mg by mouth 2 (two) times daily. 05/12/21   [provider]  senna-docusate (SENOKOT-S) 8.6-50 MG tablet Take 2 tablets by mouth 2 (two) times daily. 11/20/21   Carrie Mew, MD    Physical Exam: Vitals:   12/03/21 2200 12/03/21 2218 12/03/21 2230 12/03/21 2321  BP: 127/65  (!) 116/42 (!) 115/54  Pulse: 98  89 81  Resp:  20  Temp:    97.9 F (36.6 C)  TempSrc:      SpO2: 100% 99% 98% 100%  Weight:      Height:       Physical Exam Vitals and nursing note reviewed.  Constitutional:      General: She is not in acute distress.    Comments: Elderly frail appearing female  HENT:     Head: Normocephalic and atraumatic.  Cardiovascular:     Rate and Rhythm: Normal rate and regular rhythm.     Heart sounds: Normal heart sounds.  Pulmonary:     Effort: Pulmonary effort is normal.     Breath sounds: Normal breath sounds.  Abdominal:     Palpations: Abdomen is soft.     Tenderness: There is no abdominal tenderness.  Musculoskeletal:     Comments: kyphotic  Neurological:     Mental Status: Mental status is at baseline.      Data Reviewed: Relevant notes from primary care and specialist visits, past discharge summaries as available in EHR, including Care Everywhere. Prior diagnostic testing as pertinent to current admission diagnoses Updated medications and problem lists for reconciliation ED course, including vitals, labs, imaging, treatment and response to treatment Triage notes, nursing and pharmacy notes and ED provider's notes Notable results as noted in HPI   Assessment and Plan: * COPD exacerbation ([place]) Schedule and as needed nebulized bronchodilator treatments IV steroids transition to oral Antitussives and supportive  care Supplemental oxygen as needed to keep sats over 90  Hypoxemia O2 sats initially 88% on room air improving to the 90s with O2.  Now saturating in the 90s on room air posttreatment for COPD CTA chest showing severe emphysema Patient may have underlying chronic respiratory failure Home O2 eval prior to discharge  Anemia hemoglobin of 9.5, down from 10.6 2 weeks prior and 15 about 6 months ago Follow anemia panel  Failed back surgical syndrome (x7) On chronic oxycodone '15mg'$  qid. Followed by [facility] Continue  Lyrica and Robaxin   Protein-calorie malnutrition, moderate (HCC) Patient with BMI of 17.56 Nutritionist evaluation requested  CAD (coronary artery disease) No complaints of chest pain, EKG nonacute and first troponin negative Continue aspirin.  Holding metoprolol due to soft blood pressure.  Not currently on statins  Hypertension BP somewhat soft at 106/46 so will hold home metoprolol for tonight and resume when appropriate  Hypothyroidism Continue levothyroxine 75 mg daily  Seizures (HCC) Continue Keppra 500 twice daily   Advance Care Planning:   Code Status: Full Code   Consults: none  Family Communication: none  Severity of Illness: The appropriate patient status for this patient is OBSERVATION. Observation status is judged to be reasonable and necessary in order to provide the required intensity of service to ensure the patient's safety. The patient's presenting symptoms, physical exam findings, and initial radiographic and laboratory data in the context of their medical condition is felt to place them at decreased risk for further clinical deterioration. Furthermore, it is anticipated that the patient will be medically stable for discharge from the hospital within 2 midnights of admission.   Author: Athena Masse, MD 12/04/2021 12:09 AM  For on call review [email or web address].

## 2021-12-04 NOTE — Assessment & Plan Note (Signed)
Continue chronic pain medication oxycodone.

## 2021-12-04 NOTE — Assessment & Plan Note (Addendum)
Switch antibiotics to p.o.

## 2021-12-04 NOTE — Evaluation (Signed)
Physical Therapy Evaluation Patient Details Name: Diane Adkins MRN: [id number] DOB: Apr 22, 1946 Today's Date: 12/04/2021  History of Present Illness  Diane Adkins is a 42yoF who [facility] c SOB. Pt admitted in COPD exacerbation. 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  Clinical Impression  Pt admitted with above diagnosis. Pt currently with functional limitations due to the deficits listed below (see "PT Problem List"). Upon entry, pt in bed, asleep. The pt is easily made awake and alert, is agreeable to participate. Pt provides info regarding prior level of function, both in tolerance and independence. No assist needed for basic mobility today except for recovery after LOB during initial rise to standing. Pt AMB to doorway and returns, limited severely by Rt knee pain and elevated RR/dyspnea after AMB, recovers within 90sec, lowers SpO2 93% on room air. Pt able to perform a 2nd AMB trial, this time into hallway and back, similar outcome. Pt elects to sit EOB after session. At baseline pt is able to AMB within the house without this degree of SOB. Patient's performance this date reveals decreased ability, independence, and tolerance in performing all basic mobility required for performance of activities of daily living. Pt requires additional DME, close physical assistance, and cues for safe participate in mobility. Pt will benefit from skilled PT intervention to increase independence and safety with basic mobility in preparation for discharge to the venue listed below.        Recommendations for follow up therapy are one component of a multi-disciplinary discharge planning process, led by the attending physician.  Recommendations may be updated based on patient status, additional functional criteria and insurance authorization.  Follow Up Recommendations Home health PT    Assistance Recommended at Discharge Frequent or constant  Supervision/Assistance  Patient can return home with the following  A little help with walking and/or transfers;A little help with bathing/dressing/bathroom;Help with stairs or ramp for entrance;Assist for transportation;Assistance with cooking/housework    Equipment Recommendations None recommended by PT  Recommendations for Other Services       Functional Status Assessment Patient has had a recent decline in their functional status and demonstrates the ability to make significant improvements in function in a reasonable and predictable amount of time.     Precautions / Restrictions Precautions Precautions: Fall Restrictions Weight Bearing Restrictions: No      Mobility  Bed Mobility Overal bed mobility: Modified Independent                  Transfers Overall transfer level: Needs assistance Equipment used: None Transfers: Sit to/from Stand Sit to Stand: Min assist           General transfer comment: slight LOB upon rising the first time, requires minA for recovery    Ambulation/Gait Ambulation/Gait assistance: Min guard Gait Distance (Feet): 50 Feet Assistive device: None         General Gait Details: 2 walks 40 adn 37f, Rt knee pain and dyspnea limiting, but on room air at 93% each time despite significant dyspnea and increased RR  Stairs            Wheelchair Mobility    Modified Rankin (Stroke Patients Only)       Balance  Pertinent Vitals/Pain Pain Assessment Pain Assessment: Faces Faces Pain Scale: Hurts whole lot Pain Location: Rt knee pain after AMB    Home Living Family/patient expects to be discharged to:: Private residence Living Arrangements: Alone Available Help at Discharge: Available PRN/intermittently;Friend(s) Denman George, [place]) Type of Home: House Home Access: Stairs to enter Entrance Stairs-Rails: None Entrance Stairs-Number of Steps: 2   Home Layout:  One level Home Equipment: Cane - single point;Standard Environmental consultant      Prior Function Prior Level of Function : Independent/Modified Independent             Mobility Comments: ad lib, prn [facility] PTA; taxi to medical appointments, groceries delivered, Kathi Der is helping with dog while in hospital ADLs Comments: independent with ADLs. Uses app on phone to order groceries     Hand Dominance        Extremity/Trunk Assessment                Communication   Communication: HOH  Cognition Arousal/Alertness: Awake/alert Behavior During Therapy: WFL for tasks assessed/performed Overall Cognitive Status: Within Functional Limits for tasks assessed                                          General Comments      Exercises     Assessment/Plan    PT Assessment Patient needs continued PT services  PT Problem List Decreased strength;Decreased range of motion;Decreased activity tolerance;Decreased balance;Decreased mobility;Decreased coordination       PT Treatment Interventions DME instruction;Balance training;Gait training;Stair training;Functional mobility training;Therapeutic activities;Therapeutic exercise;Neuromuscular re-education;Patient/family education    PT Goals (Current goals can be found in the Care Plan section)  Acute Rehab PT Goals Patient Stated Goal: decrease pain, improve breathing PT Goal Formulation: With patient Time For Goal Achievement: 12/18/21 Potential to Achieve Goals: Fair    Frequency Min 2X/week     Co-evaluation               AM-PAC PT "6 Clicks" Mobility  Outcome Measure Help needed turning from your back to your side while in a flat bed without using bedrails?: None Help needed moving from lying on your back to sitting on the side of a flat bed without using bedrails?: None Help needed moving to and from a bed to a chair (including a wheelchair)?: A Little Help needed standing up from a chair using your arms  (e.g., wheelchair or bedside chair)?: A Little Help needed to walk in hospital room?: A Little Help needed climbing 3-5 steps with a railing? : A Lot 6 Click Score: 19    End of Session Equipment Utilized During Treatment: Oxygen Activity Tolerance: Patient tolerated treatment well;Patient limited by pain;Treatment limited secondary to medical complications (Comment) Patient left: in bed;with call bell/phone within reach;with bed alarm set Nurse Communication: Mobility status PT Visit Diagnosis: Difficulty in walking, not elsewhere classified (R26.2);Other abnormalities of gait and mobility (R26.89);Unsteadiness on feet (R26.81)    Time: 5361-4431 PT Time Calculation (min) (ACUTE ONLY): 12 min   Charges:   PT Evaluation $PT Eval Moderate Complexity: 1 Mod        2:45 PM, 12/04/21 Etta Grandchild, PT, DPT Physical Therapist - [facility]  [phone] ([place])    [place] C 12/04/2021, 2:40 PM

## 2021-12-04 NOTE — Consult Note (Signed)
Pharmacy Antibiotic Note  Diane Adkins is a 76 y.o. female admitted on 12/03/2021 with a dental abscess.  Pharmacy has been consulted for Unasyn dosing.  Plan: Unasyn 3 g IV q6h  Monitor clinical picture and renal function F/U C&S, abx deescalation / LOT   Height: '5\' 2"'$  (157.5 cm) Weight: 44.7 kg (98 lb 8.7 oz) IBW/kg (Calculated) : 50.1  Temp (24hrs), Avg:98.1 F (36.7 C), Min:97.9 F (36.6 C), Max:98.3 F (36.8 C)  Recent Labs  Lab 12/03/21 1728 12/04/21 0436  WBC 6.3 3.0*  CREATININE 0.72 0.90    Estimated Creatinine Clearance: 38.1 mL/min (by C-G formula based on SCr of 0.9 mg/dL).    Allergies  Allergen Reactions   Keflex [Cephalexin] Other (See Comments) and Hives    Reaction: unknown   Beef-Derived Products     malaise   Citrus     Orange juice/vomiting   Lamotrigine Itching and Swelling   Macrodantin [Nitrofurantoin Macrocrystal] Other (See Comments)    Reaction: unknown   Nitrofurantoin Hives   Statins Hives   Fosamax [Alendronate Sodium] Hives    Antimicrobials this admission: 5/26 Unasyn >>   Dose adjustments this admission:   Microbiology results: No cultures ordered at this time   Thank you for allowing pharmacy to be a part of this patient's care.  Darnelle Bos, PharmD 12/04/2021 8:59 AM

## 2021-12-04 NOTE — Progress Notes (Signed)
Initial Nutrition Assessment  DOCUMENTATION CODES:   Underweight, Severe malnutrition in context of chronic illness  INTERVENTION:   -Ensure Enlive po TID, each supplement provides 350 kcal and 20 grams of protein -MVI with minerals daily -Liberalize diet to regular to provide widest variety of meal selections  NUTRITION DIAGNOSIS:   Severe Malnutrition related to chronic illness (COPD) as evidenced by moderate fat depletion, severe fat depletion, moderate muscle depletion, severe muscle depletion, energy intake < or equal to 75% for > or equal to 1 month.  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  Pt admitted with COPD exacerbation.   Reviewed I/O's: +240 ml x 24 hours  Spoke with pt at bedside. She reports poor oral intake over the past 3 weeks secondary to decreased appetite and feeling unwell. Pt shares that she has been consuming solid foods and has been "living off Dr Malachi Bonds". Intake has improved since admission; pt reports she consumed a "delicious sandwich" down in the ED last night. Pt reports feeling hungry and breakfast tray was delivered during RD visit.   Pt shares that her UBW is around 115#, but estimates she has lost about 40 pounds within the past 3 weeks due to very poor oral intake. Pt shares that she has always been petite, but knows that she has lost a lot of weight and muscle. Reviewed wt hx; pt has experienced a 6% wt loss over the past 6 months, which is not significant for time frame.   Discussed importance of good meal and supplement intake to promote healing. Pt amenable to Ensure,  stating she drank 1-2 daily PTA. Also liberalized diet for wider variety of meal selections. Pt very appreciative of RD visit.   Medications reviewed and include folic acid, keppra, solu-medrol, and senokot.   Labs reviewed.   NUTRITION - FOCUSED PHYSICAL EXAM:  Flowsheet Row Most Recent Value  Orbital Region Moderate depletion  Upper Arm Region Severe depletion  Thoracic and Lumbar Region Severe depletion  Buccal Region Moderate depletion  Temple Region Moderate depletion  Clavicle Bone Region Severe depletion  Clavicle and Acromion Bone Region Severe depletion  Scapular Bone Region Severe depletion  Dorsal Hand Severe depletion  Patellar Region Severe depletion  Anterior Thigh Region Severe depletion  Posterior Calf Region Severe depletion  Edema (RD Assessment) None  Hair Reviewed  Eyes Reviewed  Mouth Reviewed  Skin Reviewed  Nails Reviewed       Diet Order:   Diet Order             Diet regular Room service appropriate? Yes; Fluid consistency: Thin  Diet effective now                   EDUCATION NEEDS:   Education needs have been addressed  Skin:  Skin Assessment: Skin Integrity Issues: Skin Integrity Issues:: Incisions Incisions: closed mid vertebral column  Last BM:  Unknown  Height:   Ht Readings from Last 1 Encounters:  12/03/21 '5\' 2"'$  (1.575 m)    Weight:   Wt Readings from Last 1 Encounters:  12/03/21 44.7 kg    Ideal Body Weight:  50  kg  BMI:  Body mass index is 18.02 kg/m.  Estimated Nutritional Needs:   Kcal:  2179-81025  Protein:  90-105 grams  Fluid:  > 1.7 L    Loistine Chance, RD, LDN, [place] Registered Dietitian II Certified Diabetes Care and Education Specialist Please refer to [facility] for RD and/or RD on-call/weekend/after hours pager

## 2021-12-04 NOTE — Progress Notes (Signed)
  Progress Note   Patient: Diane Adkins [id number]:[id number] DOB: 02/05/1946 DOA: 12/03/2021     0 DOS: the patient was seen and examined on 12/04/2021     Assessment and Plan: * COPD exacerbation ([place]) Change prednisone back to Solu-Medrol.  Continue nebulizer treatments.  Dental abscess Start IV Unasyn and PO Flagyl.  Also steroid should help.  Acute respiratory failure with hypoxia (HCC) O2 sats initially 88% on room air improving to the 90s with O2.  Check pulse ox on room air to see if candidate for oxygen at home or if able to come off oxygen.  Anemia Hemoglobin dropped down to 8.6.  Was 10.4 previously.  Discontinue IV fluids.  Ferritin up at 220 goes along with anemia of chronic disease.  Patient stated that she did have bleeding from the mouth previously.  Holding methotrexate.  Chronic pain syndrome Continue chronic pain medication oxycodone.  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 Followed by [facility] Continue  Lyrica and Robaxin   Protein-calorie malnutrition, moderate (HCC) Patient with BMI of 18.02   CAD (coronary artery disease) Continue aspirin.  Restart metoprolol.  Hypertension Restart metoprolol.  Hypothyroidism Continue levothyroxine 75 mg daily  Seizures (HCC) Continue Keppra 500 twice daily        Subjective: Called to see the patient because of shortness of breath and pain in the mouth.  She thinks she has a tooth infection.  Last few days having more shortness of breath and cough.  Admitted with COPD exacerbation  Physical Exam: Vitals:   12/03/21 2321 12/04/21 0250 12/04/21 0424 12/04/21 0827  BP: (!) 115/54  (!) 114/40 (!) 127/57  Pulse: 81  86 97  Resp: '20  20 18  '$ Temp: 97.9 F (36.6 C)  98 F (36.7 C) 98.3 F (36.8 C)  TempSrc:      SpO2: 100% 98%  100%  Weight: 44.7 kg     Height: '5\' 2"'$  (1.575 m)      Physical Exam HENT:     Head: Normocephalic.     Comments: Pain on palpating over the gums on the  lower mouth front. Eyes:     General: Lids are normal.     Conjunctiva/sclera: Conjunctivae normal.  Cardiovascular:     Rate and Rhythm: Normal rate and regular rhythm.     Heart sounds: Normal heart sounds, S1 normal and S2 normal.  Pulmonary:     Breath sounds: Examination of the right-lower field reveals decreased breath sounds and wheezing. Examination of the left-lower field reveals decreased breath sounds and wheezing. Decreased breath sounds and wheezing present. No rhonchi or rales.  Abdominal:     Palpations: Abdomen is soft.     Tenderness: There is no abdominal tenderness.  Musculoskeletal:     Right lower leg: No swelling.     Left lower leg: No swelling.  Skin:    General: Skin is warm.     Findings: No rash.  Neurological:     Mental Status: She is alert and oriented to person, place, and time.    Data Reviewed: Creatinine 0.9, glucose 209, ferritin 220, hemoglobin 8.6, platelet count 614, white blood cell count 3.0.  Family Communication: Not a working phone number for friend.  Disposition: Status is: Observation Change prednisone back to Solu-Medrol for treatment of COPD exacerbation.  Start IV antibiotics for tooth infection. Planned Discharge Destination: Home   Author: Loletha Grayer, MD 12/04/2021 11:05 AM  For on call review [email or web address].

## 2021-12-05 DIAGNOSIS — E43 Unspecified severe protein-calorie malnutrition: Secondary | ICD-10-CM

## 2021-12-05 DIAGNOSIS — E039 Hypothyroidism, unspecified: Secondary | ICD-10-CM

## 2021-12-05 LAB — CBC WITH DIFFERENTIAL/PLATELET
Abs Immature Granulocytes: 0.04 10*3/uL (ref 0.00–0.07)
Basophils Absolute: 0 10*3/uL (ref 0.0–0.1)
Basophils Relative: 0 %
Eosinophils Absolute: 0 10*3/uL (ref 0.0–0.5)
Eosinophils Relative: 0 %
HCT: 25.4 % — ABNORMAL LOW (ref 36.0–46.0)
Hemoglobin: 7.9 g/dL — ABNORMAL LOW (ref 12.0–15.0)
Immature Granulocytes: 1 %
Lymphocytes Relative: 11 %
Lymphs Abs: 0.9 10*3/uL (ref 0.7–4.0)
MCH: 29.2 pg (ref 26.0–34.0)
MCHC: 31.1 g/dL (ref 30.0–36.0)
MCV: 93.7 fL (ref 80.0–100.0)
Monocytes Absolute: 2.2 10*3/uL — ABNORMAL HIGH (ref 0.1–1.0)
Monocytes Relative: 27 %
Neutro Abs: 5.1 10*3/uL (ref 1.7–7.7)
Neutrophils Relative %: 61 %
Platelets: 668 10*3/uL — ABNORMAL HIGH (ref 150–400)
RBC: 2.71 MIL/uL — ABNORMAL LOW (ref 3.87–5.11)
RDW: 16.7 % — ABNORMAL HIGH (ref 11.5–15.5)
WBC: 8.2 10*3/uL (ref 4.0–10.5)
nRBC: 0 % (ref 0.0–0.2)

## 2021-12-05 MED ORDER — POLYETHYLENE GLYCOL 3350 17 G PO PACK
17.0000 g | PACK | Freq: Every day | ORAL | Status: DC
  Administered 2021-12-05 – 2021-12-07 (×2): 17 g via ORAL
  Filled 2021-12-05 (×3): qty 1

## 2021-12-05 MED ORDER — VALACYCLOVIR HCL 500 MG PO TABS
500.0000 mg | ORAL_TABLET | Freq: Two times a day (BID) | ORAL | Status: AC
  Administered 2021-12-05 – 2021-12-07 (×4): 500 mg via ORAL
  Filled 2021-12-05 (×4): qty 1

## 2021-12-05 NOTE — Progress Notes (Signed)
  Progress Note   Patient: Diane Adkins UXN:[id number] DOB: 1945-11-01 DOA: 12/03/2021     1 DOS: the patient was seen and examined on 12/05/2021     Assessment and Plan: * COPD exacerbation (HCC) Continue Solu-Medrol.  Continue nebulizer treatments.  Dental abscess Start IV Unasyn and PO Flagyl.  Also steroid should help.  Acute respiratory failure with hypoxia (HCC) O2 sats initially 88% on room air improving to the 90s with O2.  Continue to try to taper off oxygen.  Anemia Hemoglobin dropped down to 7.9.  Was 13.1 as outpatient last month.  Discontinue IV fluids.  Ferritin up at 220 goes along with anemia of chronic disease. Patient stated that she did have bleeding from the mouth previously.  May have an element of bone marrow suppression with methotrexate.  This medication has been on hold.  Case discussed with Dr. Virgina Jock gastroenterology and he will follow-up with the patient.  Chronic pain syndrome Continue chronic pain medication oxycodone.  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 Followed by [facility] Continue  Lyrica and Robaxin   Protein-calorie malnutrition, moderate (HCC) Patient with BMI of 18.02   CAD (coronary artery disease) Continue aspirin.  Restart metoprolol.  Hypertension Restart metoprolol.  Hypothyroidism Continue levothyroxine 75 mg daily  Seizures (HCC) Continue Keppra 500 twice daily        Subjective: Patient still feels short of breath.  Still feeling weak.  Admitted with COPD exacerbation and also has tooth infection.  Physical Exam: Vitals:   12/05/21 0026 12/05/21 0533 12/05/21 0742 12/05/21 0813  BP: (!) 117/45 (!) 112/51  (!) 121/53  Pulse: 85 77  79  Resp: '18 18  17  '$ Temp: 98.2 F (36.8 C) 98 F (36.7 C)  97.9 F (36.6 C)  TempSrc: Oral     SpO2: 99% 100% 100% 100%  Weight:      Height:       Physical Exam HENT:     Head: Normocephalic.     Mouth/Throat:     Pharynx: No oropharyngeal  exudate.  Eyes:     General: Lids are normal.     Conjunctiva/sclera: Conjunctivae normal.  Cardiovascular:     Rate and Rhythm: Normal rate and regular rhythm.     Heart sounds: Normal heart sounds, S1 normal and S2 normal.  Pulmonary:     Breath sounds: Examination of the right-middle field reveals decreased breath sounds. Examination of the left-middle field reveals decreased breath sounds. Examination of the right-lower field reveals decreased breath sounds. Examination of the left-lower field reveals decreased breath sounds. Decreased breath sounds present. No wheezing, rhonchi or rales.  Abdominal:     Palpations: Abdomen is soft.     Tenderness: There is no abdominal tenderness.  Musculoskeletal:     Right lower leg: No swelling.     Left lower leg: No swelling.  Neurological:     Mental Status: She is alert and oriented to person, place, and time.    Data Reviewed:   Family Communication: Left message for patient's friend Earnest Bailey at [phone]  Disposition: Status is: Inpatient Remains inpatient appropriate because: Following for drop in hemoglobin and treating for COPD exacerbation Planned Discharge Destination: Home   Author: Loletha Grayer, MD 12/05/2021 1:04 PM  For on call review [email or web address].

## 2021-12-05 NOTE — Progress Notes (Addendum)
Pt anxious at the beginning of this shift. Pt up and down her room searching for her fanny pack. RN helped search for it but can only find a bag of clothes and a pair of shoes in her room. Pt stated, "this is the 3rd time my stuff goes missing in this hospital." Pt expressed needs to file a police report, and wanted RN to list everyone that's been in her room. Charge nurse notified. Hospital security called to pt room for assistance.  12/05/2021 Update: pt notifed this RN that her fanny pack was at home. Someone from home brought it to her today and it is currently at bedside with her.

## 2021-12-05 NOTE — Consult Note (Signed)
Inpatient Consultation   Patient ID: Diane Adkins is a 76 y.o. female.  Requesting Provider: Dr. Leslye Peer  Date of Admission: 12/03/2021  Date of Consult: 12/05/21   Reason for Consultation: anemia   Patient's Chief Complaint:   Chief Complaint  Patient presents with   Shortness of Breath    76 year old Caucasian female with multiple comorbidities as below who presents with shortness of breath.  Gastroenterology is consulted for anemia.  Patient presented with oxygen saturation of 80% on room air which improved with supplemental O2.  No fever chills, chest pain.  Was being worked up for anemia by her PCP and referred to gastroenterology and was actually scheduled to see this week.  She was previously on methotrexate which has since been stopped.  She denies any melena or hematochezia, nausea or vomiting hematemesis or coffee-ground emesis.  She does note a large change in weight of approximately 20 pounds which she says happened over the course of a couple months. Reports her appetite was diminished with an acute illness for several days.  Hemoglobin last month was 13.1 is now down to 7.9 with an MCV of 93.7.  Ferritin and platelets are both elevated which could potentially be reactive as her iron saturation is only 7%.  Vital signs are stable. She continues to feel short of breath despite supplemental oxygen and current treatment for COPD exacerbation. She has chronic reflux which is controlled with PPI. Denies diarrhea and constipation.  Status post left hip replacement and multiple back surgeries  Denies NSAIDs, Anti-plt agents, and anticoagulants Denies family history of gastrointestinal disease and malignancy Previous Endoscopies: - colonoscopy 03/2014 with 1 TA; diverticulosis, IH; 2004 csy with polyps  Past Medical History:  Diagnosis Date   Arthritis    ra   Cardiomegaly    Cataract    COPD (chronic obstructive pulmonary disease) ([place])    Coronary artery disease     Depression    Diverticulitis    Epilepsy ([place])    Fibromyalgia    GERD (gastroesophageal reflux disease)    HOH (hard of hearing)    aids   Hypertension    Hypothyroidism    Inflammatory bowel disease    Lupus ([place])    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   MRSA (methicillin resistant staph aureus) culture positive 1990's   Opioid dependence ([place])    Pain    chronic back and neck   Seizures ([place])    last 2016   Spinal disorder    stenosis   Thyroid disease    hypothyroid    Past Surgical History:  Procedure Laterality Date   ABDOMINAL HYSTERECTOMY     BACK SURGERY     [age]   cervical fusion/thoracic/lumbar with ray cages   BREAST SURGERY     implants   cardiac stents  2013   x 2   CATARACT EXTRACTION W/PHACO Left 03/30/2016   Procedure: CATARACT EXTRACTION PHACO AND INTRAOCULAR LENS PLACEMENT ([place]);  Surgeon: Birder Robson, MD;  Location: [facility] ORS;  Service: Ophthalmology;  Laterality: Left;  [place] 00:45 AP% 19.6 CDE 8.93 Fluid pack lot # [id number]   CATARACT EXTRACTION W/PHACO Right 04/27/2016   Procedure: CATARACT EXTRACTION PHACO AND INTRAOCULAR LENS PLACEMENT ([facility]);  Surgeon: Birder Robson, MD;  Location: [facility] ORS;  Service: Ophthalmology;  Laterality: Right;  Lot #[id number] H [place]: 00:39.1 AP%: 17.3 CDE: 6.76   CERVICAL SPINE SURGERY     CORONARY ANGIOPLASTY     stent x2   EYE SURGERY  JOINT REPLACEMENT Left 2007   hip   rotator cuff surg Right    TOTAL HIP REVISION Left 01/24/2020   Procedure: TOTAL HIP REVISION;  Surgeon: Lovell Sheehan, MD;  Location: [facility] ORS;  Service: Orthopedics;  Laterality: Left;    Allergies  Allergen Reactions   Keflex [Cephalexin] Other (See Comments) and Hives    Reaction: unknown   Beef-Derived Products     malaise   Citrus     Orange juice/vomiting   Lamotrigine Itching and Swelling   Macrodantin [Nitrofurantoin Macrocrystal] Other (See Comments)    Reaction: unknown   Nitrofurantoin Hives   Statins Hives   Fosamax  [Alendronate Sodium] Hives    Family History  Problem Relation Age of Onset   Heart disease Mother    Cancer Father     Social History   Tobacco Use   Smoking status: Every Day    Packs/day: 0.50    Types: Cigarettes    Last attempt to quit: 02/10/2016    Years since quitting: 5.8   Smokeless tobacco: Never  Vaping Use   Vaping Use: Never used  Substance Use Topics   Alcohol use: No   Drug use: No     Pertinent GI related history and allergies were reviewed with the patient  Review of Systems  Constitutional:  Positive for appetite change and unexpected weight change. Negative for activity change, chills, diaphoresis, fatigue and fever.  HENT:  Negative for trouble swallowing and voice change.   Respiratory:  Positive for shortness of breath. Negative for wheezing.   Cardiovascular:  Negative for chest pain, palpitations and leg swelling.  Gastrointestinal:  Negative for abdominal distention, abdominal pain, anal bleeding, blood in stool, constipation, diarrhea, nausea, rectal pain and vomiting.  Musculoskeletal:  Positive for arthralgias, back pain and myalgias.  Skin:  Negative for color change and pallor.  Neurological:  Positive for weakness. Negative for dizziness and syncope.  Psychiatric/Behavioral:  Negative for agitation and confusion.   All other systems reviewed and are negative.   Medications Home Medications No current facility-administered medications on file prior to encounter.   Current Outpatient Medications on File Prior to Encounter  Medication Sig Dispense Refill   albuterol (VENTOLIN HFA) 108 (90 Base) MCG/ACT inhaler Inhale 2 puffs into the lungs every 4 (four) hours as needed for wheezing or shortness of breath.      calcium carbonate 1250 MG capsule Take 1,250 mg by mouth daily.      folic acid (FOLVITE) 1 MG tablet Take 1 mg by mouth daily.     hydroxychloroquine (PLAQUENIL) 200 MG tablet Take 200 mg by mouth daily.     levETIRAcetam (KEPPRA)  500 MG tablet Take 500 mg by mouth 2 (two) times daily.      levothyroxine (SYNTHROID) 88 MCG tablet Take 88 mcg by mouth at bedtime.     methocarbamol (ROBAXIN) 500 MG tablet Take 500 mg by mouth in the morning and at bedtime.     metoprolol tartrate (LOPRESSOR) 25 MG tablet Take 25 mg by mouth 2 (two) times daily.     mirtazapine (REMERON) 15 MG tablet Take 15 mg by mouth at bedtime.     omeprazole (PRILOSEC) 40 MG capsule Take 40 mg by mouth daily.     oxyCODONE (ROXICODONE) 15 MG immediate release tablet Take 15 mg by mouth in the morning, at noon, in the evening, and at bedtime.      PARoxetine (PAXIL) 10 MG tablet Take 10 mg by mouth  daily.     predniSONE (DELTASONE) 2.5 MG tablet Take 2.5 mg by mouth daily.     pregabalin (LYRICA) 50 MG capsule Take 50 mg by mouth 2 (two) times daily.     senna-docusate (SENOKOT-S) 8.6-50 MG tablet Take 2 tablets by mouth 2 (two) times daily. 120 tablet 0   ondansetron (ZOFRAN-ODT) 4 MG disintegrating tablet Take 1 tablet (4 mg total) by mouth every 8 (eight) hours as needed for nausea or vomiting. 20 tablet 0   Pertinent GI related medications were reviewed with the patient  Inpatient Medications  Current Facility-Administered Medications:    acetaminophen (TYLENOL) tablet 650 mg, 650 mg, Oral, Q6H PRN **OR** acetaminophen (TYLENOL) suppository 650 mg, 650 mg, Rectal, Q6H PRN, Athena Masse, MD   albuterol (PROVENTIL) (2.5 MG/3ML) 0.083% nebulizer solution 2.5 mg, 2.5 mg, Nebulization, Q2H PRN, Athena Masse, MD   Ampicillin-Sulbactam (UNASYN) 3 g in sodium chloride 0.9 % 100 mL IVPB, 3 g, Intravenous, Q6H, Darnelle Bos, RPH, Last Rate: 200 mL/hr at 12/05/21 1623, 3 g at 12/05/21 1623   enoxaparin (LOVENOX) injection 30 mg, 30 mg, Subcutaneous, Q24H, Darnelle Bos, RPH, 30 mg at 12/04/21 2111   feeding supplement (ENSURE ENLIVE / ENSURE PLUS) liquid 237 mL, 237 mL, Oral, TID BM, Wieting, Richard, MD, 237 mL at 40/81/44 8185   folic acid  (FOLVITE) tablet 1 mg, 1 mg, Oral, Daily, Wieting, Richard, MD, 1 mg at 12/05/21 6314   hydroxychloroquine (PLAQUENIL) tablet 200 mg, 200 mg, Oral, Daily, Wieting, Richard, MD, 200 mg at 12/05/21 9702   ipratropium-albuterol (DUONEB) 0.5-2.5 (3) MG/3ML nebulizer solution 3 mL, 3 mL, Nebulization, Q6H, Judd Gaudier V, MD, 3 mL at 12/05/21 1420   levETIRAcetam (KEPPRA) tablet 500 mg, 500 mg, Oral, BID, Leslye Peer, Richard, MD, 500 mg at 12/05/21 6378   levothyroxine (SYNTHROID) tablet 88 mcg, 88 mcg, Oral, QHS, Wieting, Richard, MD, 88 mcg at 12/04/21 2110   methocarbamol (ROBAXIN) tablet 500 mg, 500 mg, Oral, BID PRN, Leslye Peer, Richard, MD   methylPREDNISolone sodium succinate (SOLU-MEDROL) 40 mg/mL injection 40 mg, 40 mg, Intravenous, Daily, Leslye Peer, Richard, MD, 40 mg at 12/05/21 0929   metoprolol tartrate (LOPRESSOR) tablet 25 mg, 25 mg, Oral, BID, Leslye Peer, Richard, MD, 25 mg at 12/05/21 5885   mirtazapine (REMERON) tablet 15 mg, 15 mg, Oral, QHS, Wieting, Richard, MD, 15 mg at 12/04/21 2111   multivitamin with minerals tablet 1 tablet, 1 tablet, Oral, Daily, Wieting, Richard, MD, 1 tablet at 12/05/21 0928   ondansetron (ZOFRAN) tablet 4 mg, 4 mg, Oral, Q6H PRN **OR** ondansetron (ZOFRAN) injection 4 mg, 4 mg, Intravenous, Q6H PRN, Judd Gaudier V, MD   oxyCODONE (Oxy IR/ROXICODONE) immediate release tablet 15 mg, 15 mg, Oral, Q4H PRN, Leslye Peer, Richard, MD, 15 mg at 12/05/21 1510   pantoprazole (PROTONIX) EC tablet 40 mg, 40 mg, Oral, Daily, Wieting, Richard, MD, 40 mg at 12/05/21 0277   PARoxetine (PAXIL) tablet 10 mg, 10 mg, Oral, Daily, Wieting, Richard, MD, 10 mg at 12/05/21 4128   pregabalin (LYRICA) capsule 50 mg, 50 mg, Oral, BID, Leslye Peer, Richard, MD, 50 mg at 12/05/21 7867   senna-docusate (Senokot-S) tablet 2 tablet, 2 tablet, Oral, BID, Loletha Grayer, MD, 2 tablet at 12/05/21 0928   traZODone (DESYREL) tablet 50 mg, 50 mg, Oral, QHS PRN, Loletha Grayer, MD, 50 mg at 12/04/21 2110   ampicillin-sulbactam (UNASYN) IV 3 g (12/05/21 1623)    acetaminophen **OR** acetaminophen, albuterol, methocarbamol, ondansetron **OR** ondansetron (ZOFRAN) IV, oxyCODONE, traZODone  Objective   Vitals:   12/05/21 0742 12/05/21 0813 12/05/21 1422 12/05/21 1616  BP:  (!) 121/53  (!) 116/54  Pulse:  79  76  Resp:  17  19  Temp:  97.9 F (36.6 C)  98.1 F (36.7 C)  TempSrc:      SpO2: 100% 100% 100% 100%  Weight:      Height:         Physical Exam Vitals and nursing note reviewed.  Constitutional:      General: She is not in acute distress.    Appearance: Normal appearance. She is ill-appearing (chronically). She is not toxic-appearing or diaphoretic.     Comments: Thin, frail appearing  HENT:     Head: Normocephalic and atraumatic.     Ears:     Comments: HOH    Nose: Nose normal.     Mouth/Throat:     Mouth: Mucous membranes are moist.     Pharynx: Oropharynx is clear.  Eyes:     General: No scleral icterus.    Extraocular Movements: Extraocular movements intact.  Cardiovascular:     Rate and Rhythm: Normal rate and regular rhythm.     Heart sounds: Normal heart sounds. No murmur heard.   No friction rub. No gallop.  Pulmonary:     Effort: No respiratory distress.     Breath sounds: No wheezing, rhonchi or rales.     Comments: Diminished bilaterally Abdominal:     General: Abdomen is flat. Bowel sounds are normal. There is no distension.     Palpations: Abdomen is soft.     Tenderness: There is no abdominal tenderness. There is no guarding or rebound.  Musculoskeletal:     Cervical back: Neck supple.     Right lower leg: No edema.     Left lower leg: No edema.  Skin:    General: Skin is warm and dry.     Coloration: Skin is not jaundiced or pale.  Neurological:     General: No focal deficit present.     Mental Status: She is alert and oriented to person, place, and time. Mental status is at baseline.  Psychiatric:        Mood and Affect: Mood normal.         Behavior: Behavior normal.        Thought Content: Thought content normal.        Judgment: Judgment normal.    Laboratory Data Recent Labs  Lab 12/03/21 1728 12/04/21 0436 12/05/21 0400  WBC 6.3 3.0* 8.2  HGB 9.5* 8.6* 7.9*  HCT 30.2* 26.5* 25.4*  PLT 682* 614* 668*   Recent Labs  Lab 12/03/21 1728 12/04/21 0436  NA 135 140  K 4.0 3.6  CL 100 104  CO2 27 27  BUN 15 18  CALCIUM 8.8* 8.7*  GLUCOSE 104* 209*   No results for input(s): INR in the last 168 hours.  No results for input(s): LIPASE in the last 72 hours.      Imaging Studies: DG Chest 2 View  Result Date: 12/03/2021 CLINICAL DATA:  Shortness of breath, chest pain EXAM: CHEST - 2 VIEW COMPARISON:  CT chest dated 05/19/2021 FINDINGS: Lungs are clear.  No pleural effusion or pneumothorax. The heart is normal in size. Thoracic dextroscoliosis. Cervical spine fixation hardware. Lumbar spine fixation hardware. IMPRESSION: Normal chest radiographs. Electronically Signed   By: Julian Hy M.D.   On: 12/03/2021 18:04   CT Angio Chest PE W and/or Wo  Contrast  Result Date: 12/03/2021 CLINICAL DATA:  Pulmonary embolism (PE) suspected, positive D-dimer. Dyspnea, hypoxia. EXAM: CT ANGIOGRAPHY CHEST WITH CONTRAST TECHNIQUE: Multidetector CT imaging of the chest was performed using the standard protocol during bolus administration of intravenous contrast. Multiplanar CT image reconstructions and MIPs were obtained to evaluate the vascular anatomy. RADIATION DOSE REDUCTION: This exam was performed according to the departmental dose-optimization program which includes automated exposure control, adjustment of the mA and/or kV according to patient size and/or use of iterative reconstruction technique. CONTRAST:  76m OMNIPAQUE IOHEXOL 350 MG/ML SOLN COMPARISON:  05/19/2021 FINDINGS: Cardiovascular: There is adequate opacification of the pulmonary arterial tree. No intraluminal filling defect identified to suggest acute  pulmonary embolism. The central pulmonary arteries are mildly enlarged in keeping with changes of pulmonary arterial hypertension. Extensive multi-vessel coronary artery calcification. Global cardiac size within normal limits. No pericardial effusion. Moderate atherosclerotic calcification within the thoracic aorta. No thoracic aortic aneurysm Mediastinum/Nodes: No enlarged mediastinal, hilar, or axillary lymph nodes. Thyroid gland, trachea, and esophagus demonstrate no significant findings. Lungs/Pleura: Severe emphysema with pulmonary hyperinflation. No focal pulmonary nodules or infiltrates. No pneumothorax or pleural effusion. Central airways are widely patent. Upper Abdomen: Extensive atheromatous plaquing mural thrombus is seen within visualized juxtarenal abdominal aorta, incompletely evaluated on this examination no acute abnormality. Musculoskeletal: Marked thoracolumbar dextro and kyphoscoliosis with degenerative changes noted throughout the visualized thoracic spine. No acute bone abnormality. 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 Review of the MIP images confirms the above findings. IMPRESSION: No pulmonary embolism. Extensive multi-vessel coronary artery calcification. Severe emphysema. Aortic Atherosclerosis (ICD10-I70.0) and Emphysema (ICD10-J43.9). Electronically Signed   By: AFidela SalisburyM.D.   On: 12/03/2021 20:55    Assessment:   # Anemia - unclear origin- mixed picture - may be related to MTX use, and now with inflammation ferritin and plt up - iron sat is low at 7% - hgb dramatically down from last month at 13.1 to 7.9- despite this no signs of GIB - colonoscopy 03/2014 with 1 TA; 2004 csy with polyps  # abnormal weight loss- reports swing from 110 --> 86 --> 96  # acute hypoxic respiratory failure 2/2 COPD exacerbation- [facility]- does not wear at home  # dental abscess on abx  # chronic pain with long term opioid use #  htn, hypothyroidism, seizures, PCM  Plan:  Pt is very hard of hearing and it is difficult to obtain story from her She is significantly anemic compared to a month ago but denies any signs of GIB. Ferritin and tibc are not necessarily reflective of ida, but sat % is down to 7  Would allow for respiratory status to improve prior to any endoscopy. Will re-evaluate over the course of the weekend with tentative plan for egd/colonoscopy on Tuesday as she is quite frail and I believe would have a difficult time with prepping by herself.  Monitor H&H.  Transfusion and resuscitation as per primary team Avoid frequent lab draws to prevent lab induced anemia Supportive care and antiemetics as per primary team Maintain two sites IV access Avoid nsaids On ppi once daily po Continue bowel regimen Monitor for GIB.  I personally performed the service.  Management of other medical comorbidities as per primary team  Thank you for allowing [facility] participate in this patient's care. Please don't hesitate to call if any questions or concerns arise.   SAnnamaria Helling DO [facility]  Portions of the record may have  been created with voice recognition software. Occasional wrong-word or 'sound-a-like' substitutions may have occurred due to the inherent limitations of voice recognition software.  Read the chart carefully and recognize, using context, where substitutions may have occurred.

## 2021-12-05 NOTE — Plan of Care (Signed)
m °

## 2021-12-05 NOTE — Evaluation (Signed)
Occupational Therapy Evaluation Patient Details Name: Diane Adkins MRN: [id number] DOB: 1945-08-06 Today's Date: 12/05/2021   History of Present Illness Brittni Hult is a 57yoF who comes to [facility] on 12/03/21 c SOB. Pt admitted in COPD exacerbation. 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   Clinical Impression   Pt admitted with above diagnosis. Upon entering pt in chair - finished with breakfast- nsg flushing IV line- report pt anemic and low HBG - pt denies pain more fatigue.  O2 98 and HR 86 - Do report her back pain limits her a lot at home- she do have SPC and walker but do not really use them. Tub shower with chair and use doors to hold on - HH shower. Most of the time sponge bath.  Pt S in sit<> stand and transfers. And simulation of clothing management S - no LOB - LB dressing crossing foot up and Independent - do report she has reacher. Reviewed with pt joint protection and energy conservation  - pt report her vision and hearing is getting worse- she cannot read sy the moment. Would recommend for OT to further assess  ADL's and functional mobility and if impaired by vision changes.  Pt report not driving - services or friends taking her to appt or drop off groceries. Pt is limited by act tolerance, pain  in ADL's and IADL's- limited by vision - to be further assess. Pt will benefit from skilled OT intervention to increase independence and safety with  ADL's and IADL:'s.      Recommendations for follow up therapy are one component of a multi-disciplinary discharge planning process, led by the attending physician.  Recommendations may be updated based on patient status, additional functional criteria and insurance authorization.   Follow Up Recommendations    [place] Recommended at Discharge    Patient can return home with the  following  HHOT    Functional Status Assessment     Equipment Recommendations        Recommendations for Other Services       Precautions / Restrictions Precautions Precautions: Fall Restrictions Weight Bearing Restrictions: No      Mobility Bed Mobility Overal bed mobility: Modified Independent                  Transfers     Transfers: Sit to/from Stand Sit to Stand: Supervision           General transfer comment: Ind from chair do use one armrest to push up - no LOB - fatigue - O2 98 and HR 86      Balance Overall balance assessment: No apparent balance deficits (not formally assessed)                                         ADL either performed or assessed with clinical judgement   ADL                                         General ADL Comments: LB dressing and bathing sitting SBA - cross feet up over knees to do foot wear - take time-  UB ADL's SBA sitting - taking her time- grooming and eating ind with setup - no LOB  Vision Baseline Vision/History: 1 Wears glasses Patient Visual Report: Other (comment) Vision Assessment?: Vision impaired- to be further tested in functional context Additional Comments: Macular degeneration - pt says her vision and hearing is getting worse - cannot read at this time -could see pictures on paper if light is good     Perception     Praxis      Pertinent Vitals/Pain Pain Assessment Pain Score: 8  Pain Location: Back pain Pain Descriptors / Indicators: Aching Pain Intervention(s): Limited activity within patient's tolerance     Hand Dominance Right   Extremity/Trunk Assessment Upper Extremity Assessment Upper Extremity Assessment: Overall WFL for tasks assessed   Lower Extremity Assessment Lower Extremity Assessment: Defer to PT evaluation       Communication Communication Communication: HOH   Cognition Arousal/Alertness: Awake/alert Behavior During Therapy: WFL for tasks assessed/performed Overall Cognitive Status: Within Functional Limits for  tasks assessed                                 General Comments: her vision and hearing is getting worse - not able to read at the moment - need to get them checked again     General Comments  Back pain do limit her when up and about - but no LOB with simulation of bathroom transfer and pulling up pants    Exercises Other Exercises Other Exercises: Energy conservation review with pt and hand out provided   Shoulder Instructions      Home Living Family/patient expects to be discharged to:: Private residence Living Arrangements: Alone Available Help at Discharge: Available PRN/intermittently;Friend(s) Type of Home: House Home Access: Stairs to enter     Home Layout: One level     Bathroom Shower/Tub: Tub/shower unit (spongebath at times, has chair in the shower and use door to hold on to - [facility] shower)         Home Equipment: Kasandra Knudsen - single point;Standard Environmental consultant          Prior Functioning/Environment Prior Level of Function : Independent/Modified Independent               ADLs Comments: independent with ADLs. Uses app on phone to order groceries and the6[employer] deliver- she does own house work light- has [place] and walker but don't really use them in house        OT Problem List: Decreased strength;Impaired balance (sitting and/or standing);Decreased safety awareness;Pain      OT Treatment/Interventions: Self-care/ADL training;Visual/perceptual remediation/compensation;Patient/family education;Energy conservation    OT Goals(Current goals can be found in the care plan section) Acute Rehab OT Goals Patient Stated Goal: Go home OT Goal Formulation: With patient Time For Goal Achievement: 12/18/21 Potential to Achieve Goals: Good  OT Frequency: Min 1X/week    Co-evaluation              AM-PAC OT "6 Clicks" Daily Activity     Outcome Measure Help from another person eating meals?: A Little Help from another person taking care of personal grooming?: A  Little Help from another person toileting, which includes using toliet, bedpan, or urinal?: A Little Help from another person bathing (including washing, rinsing, drying)?: A Little Help from another person to put on and taking off regular upper body clothing?: A Little Help from another person to put on and taking off regular lower body clothing?: A Little 6 Click Score: 18   End of Session Equipment Utilized During Treatment:  Gait belt  Activity Tolerance: Patient tolerated treatment well Patient left: in chair;with call bell/phone within reach;with chair alarm set  OT Visit Diagnosis: Muscle weakness (generalized) (M62.81);Low vision, both eyes (H54.2);Pain Pain - Right/Left:  (back)                Time: 5784-6962 OT Time Calculation (min): 39 min Charges:  OT General Charges $OT Visit: 1 Visit OT Evaluation $OT Eval Low Complexity: 1 Low OT Treatments $Self Care/Home Management : 8-22 mins   Derika Eckles OTR/L,CLT 12/05/2021, 10:58 AM

## 2021-12-06 LAB — COMPREHENSIVE METABOLIC PANEL
ALT: 30 U/L (ref 0–44)
AST: 38 U/L (ref 15–41)
Albumin: 2.5 g/dL — ABNORMAL LOW (ref 3.5–5.0)
Alkaline Phosphatase: 48 U/L (ref 38–126)
Anion gap: 6 (ref 5–15)
BUN: 25 mg/dL — ABNORMAL HIGH (ref 8–23)
CO2: 32 mmol/L (ref 22–32)
Calcium: 8.3 mg/dL — ABNORMAL LOW (ref 8.9–10.3)
Chloride: 106 mmol/L (ref 98–111)
Creatinine, Ser: 1.04 mg/dL — ABNORMAL HIGH (ref 0.44–1.00)
GFR, Estimated: 56 mL/min — ABNORMAL LOW (ref >60)
Glucose, Bld: 98 mg/dL (ref 70–99)
Potassium: 4.1 mmol/L (ref 3.5–5.1)
Sodium: 144 mmol/L (ref 135–145)
Total Bilirubin: 0.3 mg/dL (ref 0.3–1.2)
Total Protein: 5.6 g/dL — ABNORMAL LOW (ref 6.5–8.1)

## 2021-12-06 LAB — CBC
HCT: 26.1 % — ABNORMAL LOW (ref 36.0–46.0)
Hemoglobin: 7.9 g/dL — ABNORMAL LOW (ref 12.0–15.0)
MCH: 29 pg (ref 26.0–34.0)
MCHC: 30.3 g/dL (ref 30.0–36.0)
MCV: 96 fL (ref 80.0–100.0)
Platelets: 714 10*3/uL — ABNORMAL HIGH (ref 150–400)
RBC: 2.72 MIL/uL — ABNORMAL LOW (ref 3.87–5.11)
RDW: 16.8 % — ABNORMAL HIGH (ref 11.5–15.5)
WBC: 6.7 10*3/uL (ref 4.0–10.5)
nRBC: 0 % (ref 0.0–0.2)

## 2021-12-06 LAB — LACTATE DEHYDROGENASE: LDH: 139 U/L (ref 98–192)

## 2021-12-06 LAB — PREPARE RBC (CROSSMATCH)

## 2021-12-06 MED ORDER — ACETAMINOPHEN 325 MG PO TABS
650.0000 mg | ORAL_TABLET | Freq: Once | ORAL | Status: AC
  Administered 2021-12-06: 650 mg via ORAL
  Filled 2021-12-06: qty 2

## 2021-12-06 MED ORDER — FUROSEMIDE 10 MG/ML IJ SOLN
20.0000 mg | Freq: Once | INTRAMUSCULAR | Status: AC
  Administered 2021-12-06: 20 mg via INTRAVENOUS
  Filled 2021-12-06: qty 2

## 2021-12-06 MED ORDER — SODIUM CHLORIDE 0.9% IV SOLUTION: Freq: Once | INTRAVENOUS | Status: AC

## 2021-12-06 MED ORDER — PEG 3350-KCL-NA BICARB-NACL 420 G PO SOLR: 2000.0000 mL | Freq: Once | ORAL | Status: DC | PRN

## 2021-12-06 MED ORDER — PEG 3350-KCL-NA BICARB-NACL 420 G PO SOLR
4000.0000 mL | Freq: Once | ORAL | Status: AC
  Administered 2021-12-07: 4000 mL via ORAL
  Filled 2021-12-06: qty 4000

## 2021-12-06 MED ORDER — IPRATROPIUM-ALBUTEROL 0.5-2.5 (3) MG/3ML IN SOLN
3.0000 mL | Freq: Three times a day (TID) | RESPIRATORY_TRACT | Status: DC
  Administered 2021-12-06 – 2021-12-08 (×6): 3 mL via RESPIRATORY_TRACT
  Filled 2021-12-06 (×8): qty 3

## 2021-12-06 NOTE — Progress Notes (Signed)
Progress Note   Patient: Diane Adkins QMG:[id number] DOB: [birth year] DOA: 12/03/2021     2 DOS: the patient was seen and examined on 12/06/2021   Brief hospital course: No notes on file  Assessment and Plan: * COPD exacerbation ([place]) Continue Solu-Medrol.  Continue nebulizer treatments.  Dental abscess Continue IV Unasyn and PO Flagyl.  Also steroid should help.  Acute respiratory failure with hypoxia (HCC) O2 sats initially 88% on room air.  Patient was off oxygen this morning.  Anemia Hemoglobin dropped down to 7.9.  Was 13.1 as outpatient last month.  Ferritin up at 220 goes along with anemia of chronic disease.  Initially had pancytopenia as outpatient which could be methotrexate toxicity.  I started oral folic acid which could increase the platelet count.  We will transfuse 1 unit of packed red blood cells.  GI to do EGD and colonoscopy on Tuesday  Chronic pain syndrome Continue chronic pain medication oxycodone.  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 Followed by [facility] Continue  Lyrica and Robaxin   Protein-calorie malnutrition, moderate (HCC) Patient with BMI of 18.02   CAD (coronary artery disease) Continue aspirin and metoprolol.  Hypertension Continue metoprolol.  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  I will send off a heavy metal screen.     Subjective: Patient still feels short of breath when she is moving around.  Off oxygen currently.  Breathing a little bit better than when she came in.  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  Admitted with COPD exacerbation and also had a drop in hemoglobin.  Physical Exam: Vitals:   12/06/21 0810 12/06/21 1126 12/06/21 1154 12/06/21 1315  BP: (!) 117/49 128/66 (!) 152/77   Pulse: 83  90 83   Resp: '16 18 17   '$ Temp: 97.8 F (36.6 C) 98.5 F (36.9 C) 98.1 F (36.7 C)   TempSrc: Oral Oral Oral   SpO2: 94% 96% 98% 98%  Weight:      Height:       Physical Exam HENT:     Head: Normocephalic.     Mouth/Throat:     Pharynx: No oropharyngeal exudate.  Eyes:     General: Lids are normal.     Conjunctiva/sclera: Conjunctivae normal.  Cardiovascular:     Rate and Rhythm: Normal rate and regular rhythm.     Heart sounds: Normal heart sounds, S1 normal and S2 normal.  Pulmonary:     Breath sounds: Examination of the right-middle field reveals decreased breath sounds. Examination of the left-middle field reveals decreased breath sounds. Examination of the right-lower field reveals decreased breath sounds. Examination of the left-lower field reveals decreased breath sounds. Decreased breath sounds present. No wheezing, rhonchi or rales.     Comments: Kyphoscoliosis. Abdominal:     Palpations: Abdomen is soft.     Tenderness: There is no abdominal tenderness.  Musculoskeletal:     Right lower leg: No swelling.     Left lower leg: No swelling.  Skin:    General: Skin is warm.     Findings: No rash.  Neurological:     Mental Status: She is alert and oriented to person, place, and time.    Data Reviewed: Hemoglobin 7.9, creatinine 1.04, platelet count up at  17  Family Communication: Updated patient friend Saman  Disposition: Status is: Inpatient Remains inpatient appropriate because: Treating for COPD exacerbation.  Giving a unit of blood today.  Will need GI work-up on Tuesday prior to disposition. Planned Discharge Destination: Home with home health   Author: Loletha Grayer, MD 12/06/2021 2:07 PM  For on call review [email or web address].

## 2021-12-06 NOTE — Progress Notes (Signed)
Physical Therapy Treatment Patient Details Name: Diane Adkins MRN: [id number] DOB: 1946/02/19 Today's Date: 12/06/2021   History of Present Illness Diane Adkins is a 21yoF who [facility] c SOB. Pt admitted in COPD exacerbation. 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    PT Comments    Pt seen for PT tx with pt agreeable. Pt without pain but requires frequent rest breaks 2/2 SOB & fatigue during session. Pt is able to complete bed mobility & transfers independently. Pt ambulates in room & hallway without AD & close supervision<>CGA, pt will benefit from either RW trial or further high level balance training to progress to mod I gait. Practiced negotiating 2 stairs with R rail to simulate home set up (pt holds to wall on R side) with pt requiring min assist & standing rest break at top. Will continue to follow pt acutely to address balance, endurance, gait with LRAD, and stair negotiation to increase safety & reduce fall risk prior to return home.    Recommendations for follow up therapy are one component of a multi-disciplinary discharge planning process, led by the attending physician.  Recommendations may be updated based on patient status, additional functional criteria and insurance authorization.  Follow Up Recommendations  Home health PT     Assistance Recommended at Discharge Intermittent Supervision/Assistance  Patient can return home with the following A little help with walking and/or transfers;Assistance with cooking/housework;A little help with bathing/dressing/bathroom;Assist for transportation;Help with stairs or ramp for entrance   Equipment Recommendations  None recommended by PT    Recommendations for Other Services       Precautions / Restrictions Precautions Precautions: Fall Restrictions Weight Bearing Restrictions: No     Mobility  Bed Mobility Overal bed mobility: Modified Independent              General bed mobility comments: supine<>sit with HOB elevated    Transfers Overall transfer level: Independent Equipment used: None Transfers: Sit to/from Stand Sit to Stand: Independent           General transfer comment: STS from EOB & toilet without physical assistance    Ambulation/Gait Ambulation/Gait assistance: Supervision, Min guard Gait Distance (Feet): 100 Feet (+ 100 ft) Assistive device: None Gait Pattern/deviations: Decreased step length - right, Decreased step length - left, Decreased dorsiflexion - right, Decreased dorsiflexion - left Gait velocity: decreased     General Gait Details: Pt ambulates without AD from room to stairs & back, and into bathroom in room without AD. Pt ambulates with close supervision<>CGA with pt electing to hold to objects for support occasionally with PT offering RW but not using during session. Pt reports hx of multiple back surgeries & demonstrates significant kyphosis with pt keeping hands on knees throughout gait. Pt requires frequent standing rest breaks 2/2 SOB/fatigue.   Stairs Stairs: Yes Stairs assistance: Min assist Stair Management: One rail Right Number of Stairs: 2 General stair comments: Pt uses R ascending rail to simulate wall she holds to at home. Pt requires min assist for stair negotiation & standing rest break after ascending 2 stairs 2/2 fatigue & SOB before descending.   Wheelchair Mobility    Modified Rankin (Stroke Patients Only)       Balance Overall balance assessment: Needs assistance Sitting-balance support: Feet supported Sitting balance-Leahy Scale: Normal     Standing balance support: No upper extremity supported, During functional activity Standing balance-Leahy Scale: Fair  Cognition Arousal/Alertness: Awake/alert Behavior During Therapy: WFL for tasks assessed/performed Overall Cognitive Status: Within Functional Limits for tasks  assessed                                          Exercises      General Comments General comments (skin integrity, edema, etc.): Pt toileted without assistance. SpO2 >90% throughout session while on room air.      Pertinent Vitals/Pain Pain Assessment Pain Assessment: No/denies pain    Home Living                          Prior Function            PT Goals (current goals can now be found in the care plan section) Acute Rehab PT Goals Patient Stated Goal: get better PT Goal Formulation: With patient Time For Goal Achievement: 12/18/21 Potential to Achieve Goals: Fair Progress towards PT goals: Progressing toward goals    Frequency    Min 2X/week      PT Plan Current plan remains appropriate    Co-evaluation              AM-PAC PT "6 Clicks" Mobility   Outcome Measure  Help needed turning from your back to your side while in a flat bed without using bedrails?: None Help needed moving from lying on your back to sitting on the side of a flat bed without using bedrails?: None Help needed moving to and from a bed to a chair (including a wheelchair)?: A Little Help needed standing up from a chair using your arms (e.g., wheelchair or bedside chair)?: None Help needed to walk in hospital room?: A Little Help needed climbing 3-5 steps with a railing? : A Little 6 Click Score: 21    End of Session   Activity Tolerance: Patient limited by fatigue Patient left: in bed;with call bell/phone within reach;with bed alarm set Nurse Communication: Mobility status PT Visit Diagnosis: Difficulty in walking, not elsewhere classified (R26.2);Other abnormalities of gait and mobility (R26.89);Unsteadiness on feet (R26.81);Muscle weakness (generalized) (M62.81)     Time: 3491-7915 PT Time Calculation (min) (ACUTE ONLY): 15 min  Charges:  $Therapeutic Activity: 8-22 mins                     Lavone Nian, PT, DPT 12/06/21, 3:13  PM    Diane Adkins 12/06/2021, 3:12 PM

## 2021-12-06 NOTE — Progress Notes (Signed)
Inpatient Follow-up/Progress Note   Patient ID: Diane Adkins is a 76 y.o. female.  Overnight Events / Subjective Findings NAEON. Pt to receive 1 u prbc. BM yesterday was brown per nursing. Off of supplemental o2. Pt feels breathing improved.  No n/v/abdominal pain. Tolerating po No other acute gi complaints.  Review of Systems  Constitutional:  Positive for appetite change and unexpected weight change. Negative for activity change, chills, diaphoresis, fatigue and fever.  HENT:  Negative for trouble swallowing and voice change.   Respiratory:  Negative for shortness of breath. Negative for wheezing.   Cardiovascular:  Negative for chest pain, palpitations and leg swelling.  Gastrointestinal:  Negative for abdominal distention, abdominal pain, anal bleeding, blood in stool, constipation, diarrhea, nausea, rectal pain and vomiting.  Musculoskeletal:  Positive for arthralgias, back pain and myalgias.  Skin:  Negative for color change and pallor.  Neurological:  Positive for weakness. Negative for dizziness and syncope.  Psychiatric/Behavioral:  Negative for agitation and confusion.   All other systems reviewed and are negative.   Medications  Current Facility-Administered Medications:    acetaminophen (TYLENOL) tablet 650 mg, 650 mg, Oral, Q6H PRN **OR** acetaminophen (TYLENOL) suppository 650 mg, 650 mg, Rectal, Q6H PRN, Athena Masse, MD   albuterol (PROVENTIL) (2.5 MG/3ML) 0.083% nebulizer solution 2.5 mg, 2.5 mg, Nebulization, Q2H PRN, Athena Masse, MD   Ampicillin-Sulbactam (UNASYN) 3 g in sodium chloride 0.9 % 100 mL IVPB, 3 g, Intravenous, Q6H, Darnelle Bos, RPH, Stopped at 12/06/21 0914   enoxaparin (LOVENOX) injection 30 mg, 30 mg, Subcutaneous, Q24H, Darnelle Bos, RPH, 30 mg at 12/05/21 2119   feeding supplement (ENSURE ENLIVE / ENSURE PLUS) liquid 237 mL, 237 mL, Oral, TID BM, Wieting, Richard, MD, 237 mL at 47/42/59 5638   folic acid (FOLVITE) tablet 1 mg, 1 mg,  Oral, Daily, Wieting, Richard, MD, 1 mg at 12/06/21 7564   hydroxychloroquine (PLAQUENIL) tablet 200 mg, 200 mg, Oral, Daily, Wieting, Richard, MD, 200 mg at 12/06/21 0913   ipratropium-albuterol (DUONEB) 0.5-2.5 (3) MG/3ML nebulizer solution 3 mL, 3 mL, Nebulization, TID, Wieting, Richard, MD   levETIRAcetam (KEPPRA) tablet 500 mg, 500 mg, Oral, BID, Leslye Peer, Richard, MD, 500 mg at 12/06/21 3329   levothyroxine (SYNTHROID) tablet 88 mcg, 88 mcg, Oral, QHS, Wieting, Richard, MD, 88 mcg at 12/05/21 2118   methocarbamol (ROBAXIN) tablet 500 mg, 500 mg, Oral, BID PRN, Leslye Peer, Richard, MD   methylPREDNISolone sodium succinate (SOLU-MEDROL) 40 mg/mL injection 40 mg, 40 mg, Intravenous, Daily, Wieting, Richard, MD, 40 mg at 12/06/21 0815   metoprolol tartrate (LOPRESSOR) tablet 25 mg, 25 mg, Oral, BID, Leslye Peer, Richard, MD, 25 mg at 12/06/21 0816   mirtazapine (REMERON) tablet 15 mg, 15 mg, Oral, QHS, Wieting, Richard, MD, 15 mg at 12/05/21 2118   multivitamin with minerals tablet 1 tablet, 1 tablet, Oral, Daily, Wieting, Richard, MD, 1 tablet at 12/06/21 0816   ondansetron (ZOFRAN) tablet 4 mg, 4 mg, Oral, Q6H PRN **OR** ondansetron (ZOFRAN) injection 4 mg, 4 mg, Intravenous, Q6H PRN, Judd Gaudier V, MD   oxyCODONE (Oxy IR/ROXICODONE) immediate release tablet 15 mg, 15 mg, Oral, Q4H PRN, Leslye Peer, Richard, MD, 15 mg at 12/06/21 0817   pantoprazole (PROTONIX) EC tablet 40 mg, 40 mg, Oral, Daily, Wieting, Richard, MD, 40 mg at 12/06/21 0816   PARoxetine (PAXIL) tablet 10 mg, 10 mg, Oral, Daily, Wieting, Richard, MD, 10 mg at 12/06/21 0913   polyethylene glycol (MIRALAX / GLYCOLAX) packet 17 g, 17 g,  Oral, Daily, Annamaria Helling, DO, 17 g at 12/05/21 1835   [START ON 12/07/2021] polyethylene glycol-electrolytes (NuLYTELY) solution 2,000 mL, 2,000 mL, Oral, Once PRN, Annamaria Helling, DO   [START ON 12/07/2021] polyethylene glycol-electrolytes (NuLYTELY) solution 4,000 mL, 4,000 mL, Oral, Once,  Annamaria Helling, DO   pregabalin (LYRICA) capsule 50 mg, 50 mg, Oral, BID, Leslye Peer, Richard, MD, 50 mg at 12/06/21 8676   senna-docusate (Senokot-S) tablet 2 tablet, 2 tablet, Oral, BID, Loletha Grayer, MD, 2 tablet at 12/06/21 0816   traZODone (DESYREL) tablet 50 mg, 50 mg, Oral, QHS PRN, Loletha Grayer, MD, 50 mg at 12/05/21 2119   valACYclovir (VALTREX) tablet 500 mg, 500 mg, Oral, BID, Darrick Penna, RPH, 500 mg at 12/06/21 0913  ampicillin-sulbactam (UNASYN) IV Stopped (12/06/21 0914)    acetaminophen **OR** acetaminophen, albuterol, methocarbamol, ondansetron **OR** ondansetron (ZOFRAN) IV, oxyCODONE, [START ON 12/07/2021] polyethylene glycol-electrolytes, traZODone   Objective    Vitals:   12/06/21 0724 12/06/21 0810 12/06/21 1126 12/06/21 1154  BP:  (!) 117/49 128/66 (!) 152/77  Pulse:  83 90 83  Resp:  '16 18 17  '$ Temp:  97.8 F (36.6 C) 98.5 F (36.9 C) 98.1 F (36.7 C)  TempSrc:  Oral Oral Oral  SpO2: 99% 94% 96% 98%  Weight:      Height:         Physical Exam Vitals and nursing note reviewed.  Constitutional:      General: She is not in acute distress.    Appearance: Normal appearance. She is ill-appearing (chronically). She is not toxic-appearing or diaphoretic.     Comments: Thin, frail appearing  HENT:     Head: Normocephalic and atraumatic.     Ears:     Comments: HOH    Nose: Nose normal.     Mouth/Throat:     Mouth: Mucous membranes are moist.     Pharynx: Oropharynx is clear.  Eyes:     General: No scleral icterus.    Extraocular Movements: Extraocular movements intact.  Cardiovascular:     Rate and Rhythm: Normal rate and regular rhythm.     Heart sounds: Normal heart sounds. No murmur heard.   No friction rub. No gallop.  Pulmonary:     Effort: No respiratory distress.     Breath sounds: No wheezing, rhonchi or rales.     Comments: Diminished bilaterally Abdominal:     General: Abdomen is flat. Bowel sounds are normal. There is no  distension.     Palpations: Abdomen is soft.     Tenderness: There is no abdominal tenderness. There is no guarding or rebound.  Musculoskeletal:     Cervical back: Neck supple.     Right lower leg: No edema.     Left lower leg: No edema.  Skin:    General: Skin is warm and dry.     Coloration: Skin is not jaundiced or pale.  Neurological:     General: No focal deficit present.     Mental Status: She is alert and oriented to person, place, and time. Mental status is at baseline.  Psychiatric:        Mood and Affect: Mood normal.        Behavior: Behavior normal.        Thought Content: Thought content normal.        Judgment: Judgment normal.   Laboratory Data Recent Labs  Lab 12/04/21 0436 12/05/21 0400 12/06/21 0517  WBC 3.0* 8.2 6.7  HGB  8.6* 7.9* 7.9*  HCT 26.5* 25.4* 26.1*  PLT 614* 668* 714*  NEUTOPHILPCT  --  61  --   LYMPHOPCT  --  11  --   MONOPCT  --  27  --   EOSPCT  --  0  --    Recent Labs  Lab 12/03/21 1728 12/04/21 0436 12/06/21 0517  NA 135 140 144  K 4.0 3.6 4.1  CL 100 104 106  CO2 27 27 32  BUN 15 18 25*  CREATININE 0.72 0.90 1.04*  CALCIUM 8.8* 8.7* 8.3*  PROT  --   --  5.6*  BILITOT  --   --  0.3  ALKPHOS  --   --  48  ALT  --   --  30  AST  --   --  38  GLUCOSE 104* 209* 98   No results for input(s): INR in the last 168 hours.    Imaging Studies: No results found.  Assessment:   # Anemia - unclear origin- mixed picture- suspect malnutrition contributing  - may be related to MTX use, and now with inflammation ferritin and plt up - iron sat is low at 7% - hgb dramatically down from last month at 13.1 to 7.9- despite this no signs of GIB - colonoscopy 03/2014 with 1 TA; 2004 csy with polyps   # abnormal weight loss- reports swing from 110 --> 86 --> 96   # acute hypoxic respiratory failure 2/2 COPD exacerbation- off oxygen saturating 94% on RA   # dental abscess on abx   # chronic pain with long term opioid use # htn,  hypothyroidism, seizures, PCM  Plan:  EGD and Colonoscopy planned for Tuesday pending patient condition and endoscopy suite availability Receiving 1 u prbc today Monitor H&H.  Transfusion and resuscitation as per primary team Avoid frequent lab draws to prevent lab induced anemia Supportive care and antiemetics as per primary team Maintain two sites IV access Avoid nsaids On ppi once daily po Continue bowel regimen Monitor for GIB.  Go lytely ordered to start tomorrow. Second gallon ordered prn if stools not clear. Begin clear liquid diet tmrw  Esophagogastroduodenoscopy and colonoscopy with possible biopsy, control of bleeding, polypectomy, and interventions as necessary has been discussed with the patient/patient representative. Informed consent was obtained from the patient/patient representative after explaining the indication, nature, and risks of the procedure including but not limited to death, bleeding, perforation, missed neoplasm/lesions, cardiorespiratory compromise, and reaction to medications. Opportunity for questions was given and appropriate answers were provided. Patient/patient representative has verbalized understanding is amenable to undergoing the procedure.  I personally performed the service.  Management of other medical comorbidities as per primary team  Thank you for allowing [place] to participate in this patient's care. Please don't hesitate to call if any questions or concerns arise.   Annamaria Helling, DO [facility]  Portions of the record may have been created with voice recognition software. Occasional wrong-word or 'sound-a-like' substitutions may have occurred due to the inherent limitations of voice recognition software.  Read the chart carefully and recognize, using context, where substitutions may have occurred.

## 2021-12-06 NOTE — Progress Notes (Signed)
PT Cancellation Note  Patient Details Name: Diane Adkins MRN: [id number] DOB: 03-Oct-1945   Cancelled Treatment:    Reason Eval/Treat Not Completed: Medical issues which prohibited therapy  Pt receiving blood transfusion upon arrival.  Session held.  Will continue at a later time/date.   Chesley Noon 12/06/2021, 1:12 PM

## 2021-12-07 DIAGNOSIS — D75839 Thrombocytosis, unspecified: Secondary | ICD-10-CM

## 2021-12-07 LAB — BPAM RBC
Blood Product Expiration Date: 202305292359
ISSUE DATE / TIME: 202305281132
Unit Type and Rh: 9500

## 2021-12-07 LAB — TYPE AND SCREEN
ABO/RH(D): A POS
Antibody Screen: NEGATIVE
Unit division: 0

## 2021-12-07 LAB — CBC
HCT: 32.5 % — ABNORMAL LOW (ref 36.0–46.0)
Hemoglobin: 10.4 g/dL — ABNORMAL LOW (ref 12.0–15.0)
MCH: 29.3 pg (ref 26.0–34.0)
MCHC: 32 g/dL (ref 30.0–36.0)
MCV: 91.5 fL (ref 80.0–100.0)
Platelets: 794 10*3/uL — ABNORMAL HIGH (ref 150–400)
RBC: 3.55 MIL/uL — ABNORMAL LOW (ref 3.87–5.11)
RDW: 17.5 % — ABNORMAL HIGH (ref 11.5–15.5)
WBC: 8.6 10*3/uL (ref 4.0–10.5)
nRBC: 0 % (ref 0.0–0.2)

## 2021-12-07 MED ORDER — AMOXICILLIN-POT CLAVULANATE 875-125 MG PO TABS
1.0000 | ORAL_TABLET | Freq: Two times a day (BID) | ORAL | Status: DC
  Administered 2021-12-07 – 2021-12-08 (×2): 1 via ORAL
  Filled 2021-12-07 (×2): qty 1

## 2021-12-07 MED ORDER — SODIUM CHLORIDE 0.9 % IV SOLN: INTRAVENOUS | Status: DC

## 2021-12-07 MED ORDER — ENSURE ENLIVE PO LIQD: 237.0000 mL | Freq: Three times a day (TID) | ORAL | Status: DC

## 2021-12-07 NOTE — Progress Notes (Signed)
Progress Note   Patient: Diane Adkins YWV:[id number] DOB: 08-Jul-1946 DOA: 12/03/2021     3 DOS: the patient was seen and examined on 12/07/2021     Assessment and Plan: * COPD exacerbation ([place]) Continue Solu-Medrol IV daily prior to EGD and colonoscopy tomorrow..  Continue nebulizer treatments.  Respiratory status probably as good as it will get.  Dental abscess Switch antibiotics to p.o.  Acute respiratory failure with hypoxia (HCC) O2 sats initially 88% on room air.  Patient was back on oxygen this morning for some reason.  Check pulse ox on room air.  Anemia Hemoglobin dropped down to 7.9.  Was 13.1 as outpatient last month.  Ferritin up at 220 goes along with anemia of chronic disease.  Initially had pancytopenia as outpatient which could be methotrexate toxicity.  I started oral folic acid which could increase the platelet count.  Hemoglobin responded to blood transfusion up to 10.4 and likely will settle down tomorrow.  Thrombocytosis Likely reactive.  Platelet count was low as outpatient at 48.  Patient stopped methotrexate.  I did start folic acid which also can raise the count.  Platelet count up at 794.  I will stop folic acid at this point.  Chronic pain syndrome Continue chronic pain medication oxycodone.  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 Followed by [facility] Continue  Lyrica and Robaxin   Protein-calorie malnutrition, moderate (HCC) Patient with BMI of 18.02   CAD (coronary artery disease) Continue aspirin and metoprolol.  Hypertension Continue metoprolol.  Hypothyroidism Continue levothyroxine 75 mg daily  Seizures (HCC) Continue Keppra 500 twice daily        Subjective: Patient feeling a little bit better.  Hemoglobin responded to blood transfusion.  Breathing little bit better.  Initially admitted with COPD exacerbation and then had a drop in hemoglobin.  Physical Exam: Vitals:   12/07/21 0022 12/07/21 0533  12/07/21 0935 12/07/21 1346  BP: (!) 145/63 (!) 130/55 (!) 147/75   Pulse: 89 78 91   Resp: '17 17 16   '$ Temp: 98.4 F (36.9 C) 98.6 F (37 C) 97.8 F (36.6 C)   TempSrc:   Oral   SpO2: 97% 93% 100% 99%  Weight:      Height:       Physical Exam HENT:     Head: Normocephalic.     Mouth/Throat:     Pharynx: No oropharyngeal exudate.  Eyes:     General: Lids are normal.     Conjunctiva/sclera: Conjunctivae normal.  Cardiovascular:     Rate and Rhythm: Normal rate and regular rhythm.     Heart sounds: Normal heart sounds, S1 normal and S2 normal.  Pulmonary:     Breath sounds: Examination of the right-lower field reveals decreased breath sounds. Examination of the left-lower field reveals decreased breath sounds. Decreased breath sounds present. No wheezing, rhonchi or rales.     Comments: Kyphoscoliosis. Abdominal:     Palpations: Abdomen is soft.     Tenderness: There is no abdominal tenderness.  Musculoskeletal:     Right lower leg: No swelling.     Left lower leg: No swelling.  Skin:    General: Skin is warm.     Findings: No rash.  Neurological:     Mental Status: She is alert and oriented to person, place, and time.    Data Reviewed: Count 794, hemoglobin 10.4, white blood cell count 8.6  Family Communication: Spoke with patient's friend yesterday  Disposition: Status is:  Inpatient Remains inpatient appropriate because: Will have colonoscopy and endoscopy tomorrow  Planned Discharge Destination: Home with Home Health   Author: Loletha Grayer, MD 12/07/2021 1:52 PM  For on call review [email or web address].

## 2021-12-07 NOTE — Care Management Important Message (Signed)
Important Message  Patient Details  Name: Diane Adkins MRN: [id number] Date of Birth: 17-Feb-1946   Medicare Important Message Given:  Yes     [name] 12/07/2021, 10:26 AM

## 2021-12-07 NOTE — Assessment & Plan Note (Signed)
Likely reactive.  Platelet count was low as outpatient at 48.  Patient stopped methotrexate.  I did start folic acid which also can raise the count.  Platelet count up at 794.  I will stop folic acid at this point.

## 2021-12-07 NOTE — Progress Notes (Signed)
Inpatient Follow-up/Progress Note   Patient ID: Diane Adkins is a 76 y.o. female.  Overnight Events / Subjective Findings NAEON. Rcd 1 u prbc yesterday with hgb improvement. Bowel prep for endoscopy to start today. Clear liquids today. On minimal supplemental o2 via [place]. Pt feels breathing improved.  No n/v/abdominal pain. Tolerating po No other acute gi complaints.  Review of Systems  Constitutional:  Positive for appetite change and unexpected weight change. Negative for activity change, chills, diaphoresis, fatigue and fever.  HENT:  Negative for trouble swallowing and voice change.   Respiratory:  Negative for shortness of breath. Negative for wheezing.   Cardiovascular:  Negative for chest pain, palpitations and leg swelling.  Gastrointestinal:  Negative for abdominal distention, abdominal pain, anal bleeding, blood in stool, constipation, diarrhea, nausea, rectal pain and vomiting.  Musculoskeletal:  Positive for arthralgias, back pain and myalgias.  Skin:  Negative for color change and pallor.  Neurological:  Positive for weakness. Negative for dizziness and syncope.  Psychiatric/Behavioral:  Negative for agitation and confusion.   All other systems reviewed and are negative.   Medications  Current Facility-Administered Medications:    acetaminophen (TYLENOL) tablet 650 mg, 650 mg, Oral, Q6H PRN **OR** acetaminophen (TYLENOL) suppository 650 mg, 650 mg, Rectal, Q6H PRN, Athena Masse, MD   albuterol (PROVENTIL) (2.5 MG/3ML) 0.083% nebulizer solution 2.5 mg, 2.5 mg, Nebulization, Q2H PRN, Athena Masse, MD   Ampicillin-Sulbactam (UNASYN) 3 g in sodium chloride 0.9 % 100 mL IVPB, 3 g, Intravenous, Q6H, Darnelle Bos, RPH, Last Rate: 200 mL/hr at 12/07/21 0400, 3 g at 12/07/21 0400   feeding supplement (ENSURE ENLIVE / ENSURE PLUS) liquid 237 mL, 237 mL, Oral, TID BM, Wieting, Richard, MD, 237 mL at 61/44/31 5400   folic acid (FOLVITE) tablet 1 mg, 1 mg, Oral, Daily, Wieting,  Richard, MD, 1 mg at 12/06/21 8676   hydroxychloroquine (PLAQUENIL) tablet 200 mg, 200 mg, Oral, Daily, Wieting, Richard, MD, 200 mg at 12/06/21 0913   ipratropium-albuterol (DUONEB) 0.5-2.5 (3) MG/3ML nebulizer solution 3 mL, 3 mL, Nebulization, TID, Leslye Peer, Richard, MD, 3 mL at 12/07/21 0648   levETIRAcetam (KEPPRA) tablet 500 mg, 500 mg, Oral, BID, Leslye Peer, Richard, MD, 500 mg at 12/06/21 2004   levothyroxine (SYNTHROID) tablet 88 mcg, 88 mcg, Oral, QHS, Wieting, Richard, MD, 88 mcg at 12/06/21 2004   methocarbamol (ROBAXIN) tablet 500 mg, 500 mg, Oral, BID PRN, Leslye Peer, Richard, MD   methylPREDNISolone sodium succinate (SOLU-MEDROL) 40 mg/mL injection 40 mg, 40 mg, Intravenous, Daily, Leslye Peer, Richard, MD, 40 mg at 12/06/21 0815   metoprolol tartrate (LOPRESSOR) tablet 25 mg, 25 mg, Oral, BID, Wieting, Richard, MD, 25 mg at 12/06/21 2004   mirtazapine (REMERON) tablet 15 mg, 15 mg, Oral, QHS, Wieting, Richard, MD, 15 mg at 12/06/21 2004   multivitamin with minerals tablet 1 tablet, 1 tablet, Oral, Daily, Wieting, Richard, MD, 1 tablet at 12/06/21 0816   ondansetron (ZOFRAN) tablet 4 mg, 4 mg, Oral, Q6H PRN **OR** ondansetron (ZOFRAN) injection 4 mg, 4 mg, Intravenous, Q6H PRN, Judd Gaudier V, MD   oxyCODONE (Oxy IR/ROXICODONE) immediate release tablet 15 mg, 15 mg, Oral, Q4H PRN, Leslye Peer, Richard, MD, 15 mg at 12/07/21 0648   pantoprazole (PROTONIX) EC tablet 40 mg, 40 mg, Oral, Daily, Wieting, Richard, MD, 40 mg at 12/06/21 0816   PARoxetine (PAXIL) tablet 10 mg, 10 mg, Oral, Daily, Wieting, Richard, MD, 10 mg at 12/06/21 0913   polyethylene glycol (MIRALAX / GLYCOLAX) packet 17 g, 17  g, Oral, Daily, Annamaria Helling, DO, 17 g at 12/05/21 1835   polyethylene glycol-electrolytes (NuLYTELY) solution 2,000 mL, 2,000 mL, Oral, Once PRN, Annamaria Helling, DO   polyethylene glycol-electrolytes (NuLYTELY) solution 4,000 mL, 4,000 mL, Oral, Once, Annamaria Helling, DO   pregabalin  (LYRICA) capsule 50 mg, 50 mg, Oral, BID, Loletha Grayer, MD, 50 mg at 12/06/21 2004   senna-docusate (Senokot-S) tablet 2 tablet, 2 tablet, Oral, BID, Loletha Grayer, MD, 2 tablet at 12/06/21 2004   traZODone (DESYREL) tablet 50 mg, 50 mg, Oral, QHS PRN, Loletha Grayer, MD, 50 mg at 12/06/21 2004   valACYclovir (VALTREX) tablet 500 mg, 500 mg, Oral, BID, Darrick Penna, RPH, 500 mg at 12/06/21 2004  ampicillin-sulbactam (UNASYN) IV 3 g (12/07/21 0400)    acetaminophen **OR** acetaminophen, albuterol, methocarbamol, ondansetron **OR** ondansetron (ZOFRAN) IV, oxyCODONE, polyethylene glycol-electrolytes, traZODone   Objective    Vitals:   12/06/21 1556 12/06/21 2003 12/07/21 0022 12/07/21 0533  BP: (!) 117/47 (!) 150/74 (!) 145/63 (!) 130/55  Pulse: 93 97 89 78  Resp: '16 19 17 17  '$ Temp: 98.7 F (37.1 C) 97.8 F (36.6 C) 98.4 F (36.9 C) 98.6 F (37 C)  TempSrc:      SpO2: 96% 97% 97% 93%  Weight:      Height:         Physical Exam Vitals and nursing note reviewed.  Constitutional:      General: She is not in acute distress.    Appearance: Normal appearance. She is ill-appearing (chronically). She is not toxic-appearing or diaphoretic.     Comments: Thin, frail appearing  HENT:     Head: Normocephalic and atraumatic.     Ears:     Comments: HOH    Nose: Nose normal.     Mouth/Throat:     Mouth: Mucous membranes are moist.     Pharynx: Oropharynx is clear.  Eyes:     General: No scleral icterus.    Extraocular Movements: Extraocular movements intact.  Cardiovascular:     Rate and Rhythm: Normal rate and regular rhythm.     Heart sounds: Normal heart sounds. No murmur heard.   No friction rub. No gallop.  Pulmonary:     Effort: No respiratory distress.     Breath sounds: No wheezing, rhonchi or rales.     Comments: Diminished bilaterally Abdominal:     General: Abdomen is flat. Bowel sounds are normal. There is no distension.     Palpations: Abdomen is  soft.     Tenderness: There is no abdominal tenderness. There is no guarding or rebound.  Musculoskeletal:     Cervical back: Neck supple.     Right lower leg: No edema.     Left lower leg: No edema.  Skin:    General: Skin is warm and dry.     Coloration: Skin is not jaundiced or pale.  Neurological:     General: No focal deficit present.     Mental Status: She is alert and oriented to person, place, and time. Mental status is at baseline.  Psychiatric:        Mood and Affect: Mood normal.        Behavior: Behavior normal.        Thought Content: Thought content normal.        Judgment: Judgment normal.   Laboratory Data Recent Labs  Lab 12/05/21 0400 12/06/21 0517 12/07/21 0434  WBC 8.2 6.7 8.6  HGB 7.9* 7.9*  10.4*  HCT 25.4* 26.1* 32.5*  PLT 668* 714* 794*  NEUTOPHILPCT 61  --   --   LYMPHOPCT 11  --   --   MONOPCT 27  --   --   EOSPCT 0  --   --     Recent Labs  Lab 12/03/21 1728 12/04/21 0436 12/06/21 0517  NA 135 140 144  K 4.0 3.6 4.1  CL 100 104 106  CO2 27 27 32  BUN 15 18 25*  CREATININE 0.72 0.90 1.04*  CALCIUM 8.8* 8.7* 8.3*  PROT  --   --  5.6*  BILITOT  --   --  0.3  ALKPHOS  --   --  48  ALT  --   --  30  AST  --   --  38  GLUCOSE 104* 209* 98    No results for input(s): INR in the last 168 hours.    Imaging Studies: No results found.  Assessment:   # Anemia - unclear origin- mixed picture- suspect malnutrition contributing  - may be related to MTX use, and now with inflammation ferritin and plt up - iron sat is low at 7% - hgb dramatically down from last month at 13.1 to 7.9 on presentation- despite this no signs of GIB - rcd 1 u prbc 5/28 with improvement - colonoscopy 03/2014 with 1 TA; 2004 csy with polyps   # abnormal weight loss- reports swing from 110 --> 86 --> 96   # acute hypoxic respiratory failure 2/2 COPD exacerbation- off oxygen saturating 94% on RA   # dental abscess on abx   # chronic pain with long term opioid  use # htn, hypothyroidism, seizures, PCM  Plan:  EGD and Colonoscopy planned for tomorrow pending patient condition and endoscopy suite availability Am labs- cbc bmp tomorrow Go lytely ordered to start today. Second gallon ordered prn if stools not clear. Clear liquid diet, npo at midnight. Ok for prep after midnight Receiving 1 u prbc 5/28 with improvement Monitor H&H.  Transfusion and resuscitation as per primary team Avoid frequent lab draws to prevent lab induced anemia Supportive care and antiemetics as per primary team Maintain two sites IV access Avoid nsaids Hol ddvt ppx On ppi once daily po Monitor for GIB.  Esophagogastroduodenoscopy and colonoscopy with possible biopsy, control of bleeding, polypectomy, and interventions as necessary has been discussed with the patient/patient representative. Informed consent was obtained from the patient/patient representative after explaining the indication, nature, and risks of the procedure including but not limited to death, bleeding, perforation, missed neoplasm/lesions, cardiorespiratory compromise, and reaction to medications. Opportunity for questions was given and appropriate answers were provided. Patient/patient representative has verbalized understanding is amenable to undergoing the procedure.  D/w nursing and hospitalist  I personally performed the service.  Management of other medical comorbidities as per primary team  Thank you for allowing [place] to participate in this patient's care. Please don't hesitate to call if any questions or concerns arise.   Annamaria Helling, DO [facility]  Portions of the record may have been created with voice recognition software. Occasional wrong-word or 'sound-a-like' substitutions may have occurred due to the inherent limitations of voice recognition software.  Read the chart carefully and recognize, using context, where substitutions may have occurred.

## 2021-12-08 ENCOUNTER — Encounter
Admission: EM | Disposition: A | Discharge status: Home Health | Payer: Self-pay | Source: Home / Self Care | Attending: Internal Medicine

## 2021-12-08 ENCOUNTER — Inpatient Hospital Stay: Payer: Medicare HMO | Admitting: Anesthesiology

## 2021-12-08 ENCOUNTER — Encounter: Payer: Self-pay | Admitting: Internal Medicine

## 2021-12-08 HISTORY — PX: COLONOSCOPY WITH PROPOFOL: SHX5780

## 2021-12-08 HISTORY — PX: ESOPHAGOGASTRODUODENOSCOPY (EGD) WITH PROPOFOL: SHX5813

## 2021-12-08 LAB — CBC
HCT: 34.8 % — ABNORMAL LOW (ref 36.0–46.0)
Hemoglobin: 11 g/dL — ABNORMAL LOW (ref 12.0–15.0)
MCH: 29.3 pg (ref 26.0–34.0)
MCHC: 31.6 g/dL (ref 30.0–36.0)
MCV: 92.6 fL (ref 80.0–100.0)
Platelets: 793 10*3/uL — ABNORMAL HIGH (ref 150–400)
RBC: 3.76 MIL/uL — ABNORMAL LOW (ref 3.87–5.11)
RDW: 16.7 % — ABNORMAL HIGH (ref 11.5–15.5)
WBC: 8.3 10*3/uL (ref 4.0–10.5)
nRBC: 0 % (ref 0.0–0.2)

## 2021-12-08 LAB — URINALYSIS, COMPLETE (UACMP) WITH MICROSCOPIC
Bacteria, UA: NONE SEEN
Bilirubin Urine: NEGATIVE
Glucose, UA: NEGATIVE mg/dL
Hgb urine dipstick: NEGATIVE
Ketones, ur: NEGATIVE mg/dL
Leukocytes,Ua: NEGATIVE
Nitrite: NEGATIVE
Protein, ur: NEGATIVE mg/dL
Specific Gravity, Urine: 1.005 (ref 1.005–1.030)
Squamous Epithelial / HPF: NONE SEEN (ref 0–5)
pH: 8 (ref 5.0–8.0)

## 2021-12-08 LAB — BASIC METABOLIC PANEL
Anion gap: 8 (ref 5–15)
BUN: 23 mg/dL (ref 8–23)
CO2: 32 mmol/L (ref 22–32)
Calcium: 8.9 mg/dL (ref 8.9–10.3)
Chloride: 102 mmol/L (ref 98–111)
Creatinine, Ser: 0.95 mg/dL (ref 0.44–1.00)
GFR, Estimated: >60 mL/min (ref >60)
Glucose, Bld: 86 mg/dL (ref 70–99)
Potassium: 4.1 mmol/L (ref 3.5–5.1)
Sodium: 142 mmol/L (ref 135–145)

## 2021-12-08 SURGERY — ESOPHAGOGASTRODUODENOSCOPY (EGD) WITH PROPOFOL
Anesthesia: General

## 2021-12-08 MED ORDER — PREDNISONE 20 MG PO TABS: 30.0000 mg | ORAL_TABLET | Freq: Every day | ORAL | Status: DC

## 2021-12-08 MED ORDER — ENSURE ENLIVE PO LIQD: 237.0000 mL | Freq: Three times a day (TID) | ORAL | 0 refills | Status: AC

## 2021-12-08 MED ORDER — AMOXICILLIN-POT CLAVULANATE 875-125 MG PO TABS: 1.0000 | ORAL_TABLET | Freq: Two times a day (BID) | ORAL | 0 refills | Status: AC

## 2021-12-08 MED ORDER — PREDNISONE 5 MG PO TABS: ORAL_TABLET | ORAL | 0 refills | Status: AC

## 2021-12-08 MED ORDER — LIDOCAINE HCL (CARDIAC) PF 100 MG/5ML IV SOSY
PREFILLED_SYRINGE | INTRAVENOUS | Status: DC | PRN
Start: 2021-12-08 — End: 2021-12-08
  Administered 2021-12-08: 80 mg via INTRAVENOUS
  Administered 2021-12-08: 20 mg via INTRAVENOUS

## 2021-12-08 MED ORDER — GLYCOPYRROLATE 0.2 MG/ML IJ SOLN
INTRAMUSCULAR | Status: DC | PRN
  Administered 2021-12-08: .2 mg via INTRAVENOUS

## 2021-12-08 MED ORDER — OXYCODONE HCL 15 MG PO TABS: 15.0000 mg | ORAL_TABLET | Freq: Four times a day (QID) | ORAL | 0 refills | Status: AC

## 2021-12-08 MED ORDER — PROPOFOL 10 MG/ML IV BOLUS
INTRAVENOUS | Status: DC | PRN
  Administered 2021-12-08: 20 mg via INTRAVENOUS
  Administered 2021-12-08: 30 mg via INTRAVENOUS
  Administered 2021-12-08: 10 mg via INTRAVENOUS

## 2021-12-08 MED ORDER — PROPOFOL 500 MG/50ML IV EMUL
INTRAVENOUS | Status: DC | PRN
Start: 2021-12-08 — End: 2021-12-08
  Administered 2021-12-08: 145 ug/kg/min via INTRAVENOUS

## 2021-12-08 NOTE — Transfer of Care (Signed)
Immediate Anesthesia Transfer of Care Note  Patient: Diane Adkins  Procedure(s) Performed: ESOPHAGOGASTRODUODENOSCOPY (EGD) WITH PROPOFOL COLONOSCOPY WITH PROPOFOL  Patient Location: Endoscopy Unit  Anesthesia Type:General  Level of Consciousness: drowsy and patient cooperative  Airway & Oxygen Therapy: Patient Spontanous Breathing and Patient connected to face mask oxygen  Post-op Assessment: Report given to RN and Post -op Vital signs reviewed and stable  Post vital signs: Reviewed and stable  Last Vitals:  Vitals Value Taken Time  BP    Temp    Pulse    Resp    SpO2      Last Pain:  Vitals:   12/08/21 1023  TempSrc: Temporal  PainSc: 8       Patients Stated Pain Goal: 0 ([id number])  Complications: No notable events documented.

## 2021-12-08 NOTE — Interval H&P Note (Signed)
History and Physical Interval Note: Progress Note from 12/08/21  was reviewed and there was no interval change after seeing and examining the patient.  Written consent was obtained from the patient after discussion of risks, benefits, and alternatives. Patient has consented to proceed with Esophagogastroduodenoscopy and Colonoscopy with possible intervention  12/08/2021 10:40 AM  [name]  has presented today for surgery, with the diagnosis of iron deficiency anemia, weight loss.  The various methods of treatment have been discussed with the patient and family. After consideration of risks, benefits and other options for treatment, the patient has consented to  Procedure(s): ESOPHAGOGASTRODUODENOSCOPY (EGD) WITH PROPOFOL (N/A) COLONOSCOPY WITH PROPOFOL (N/A) as a surgical intervention.  The patient's history has been reviewed, patient examined, no change in status, stable for surgery.  I have reviewed the patient's chart and labs.  Questions were answered to the patient's satisfaction.     Diane Adkins

## 2021-12-08 NOTE — Plan of Care (Signed)
Pt is d/ced and ride is present.  She called out complaining of SOB.  We checked her VS which were stable and O2 was 94% on RA.  She c/o of nausea and gave her zofran PO.  She asked for a soda which was obtained.  Then she said she was ready.  Pt was wheeled out and sd she was appreciative.

## 2021-12-08 NOTE — Op Note (Signed)
[facility] Patient Name: Diane Adkins Procedure Date: 12/08/2021 10:31 AM MRN: [id number] Account #: [id number] Date of Birth: 1946/02/01 Admit Type: Outpatient Age: 76 Room: [facility] ENDO ROOM 3 Gender: Female Note Status: Finalized Instrument Name: Upper Endoscope [id number] Procedure:             Upper GI endoscopy Indications:           Anemia Providers:             Annamaria Helling DO, DO Medicines:             Monitored Anesthesia Care Complications:         No immediate complications. Estimated blood loss:                         Minimal. Procedure:             Pre-Anesthesia Assessment:                        - Prior to the procedure, a History and Physical was                         performed, and patient medications and allergies were                         reviewed. The patient is competent. The risks and                         benefits of the procedure and the sedation options and                         risks were discussed with the patient. All questions                         were answered and informed consent was obtained.                         Patient identification and proposed procedure were                         verified by the physician, the nurse, the anesthetist                         and the technician in the endoscopy suite. Mental                         Status Examination: alert and oriented. Airway                         Examination: normal oropharyngeal airway and neck                         mobility. Respiratory Examination: poor air movement.                         CV Examination: RRR, no murmurs, no S3 or S4.                         Prophylactic Antibiotics: The patient does not require  prophylactic antibiotics. Prior Anticoagulants: The                         patient has taken no previous anticoagulant or                         antiplatelet agents. ASA Grade Assessment: III - A                          patient with severe systemic disease. After reviewing                         the risks and benefits, the patient was deemed in                         satisfactory condition to undergo the procedure. The                         anesthesia plan was to use monitored anesthesia care                         (MAC). Immediately prior to administration of                         medications, the patient was re-assessed for adequacy                         to receive sedatives. The heart rate, respiratory                         rate, oxygen saturations, blood pressure, adequacy of                         pulmonary ventilation, and response to care were                         monitored throughout the procedure. The physical                         status of the patient was re-assessed after the                         procedure.                        After obtaining informed consent, the endoscope was                         passed under direct vision. Throughout the procedure,                         the patient's blood pressure, pulse, and oxygen                         saturations were monitored continuously. The Endoscope                         was introduced through the mouth, and advanced to the  second part of duodenum. The upper GI endoscopy was                         accomplished without difficulty. The patient tolerated                         the procedure well. Findings:      The duodenal bulb, first portion of the duodenum and second portion of       the duodenum were normal. Estimated blood loss: none.      Localized mild inflammation characterized by erythema was found in the       gastric antrum. Biopsies were taken with a cold forceps for Helicobacter       pylori testing. Estimated blood loss was minimal.      A small hiatal hernia was present. Estimated blood loss: none.      The exam of the stomach was otherwise normal.      The  Z-line was regular. Estimated blood loss: none.      Esophagogastric landmarks were identified: the gastroesophageal junction       was found at 35 cm from the incisors.      The examined esophagus was tortuous. Estimated blood loss: none.      The exam of the esophagus was otherwise normal. Impression:            - Normal duodenal bulb, first portion of the duodenum                         and second portion of the duodenum.                        - Gastritis. Biopsied.                        - Small hiatal hernia.                        - Z-line regular.                        - Esophagogastric landmarks identified.                        - Tortuous esophagus. Recommendation:        - Resume regular diet.                        - Continue present medications.                        - Await pathology results.                        - Return patient to hospital ward for ongoing care.                        - The findings and recommendations were discussed with                         the patient.                        -  The findings and recommendations were discussed with                         the referring physician. Procedure Code(s):     --- Professional ---                        [phone], Esophagogastroduodenoscopy, flexible,                         transoral; with biopsy, single or multiple Diagnosis Code(s):     --- Professional ---                        K29.70, Gastritis, unspecified, without bleeding                        K44.9, Diaphragmatic hernia without obstruction or                         gangrene                        Q39.9, Congenital malformation of esophagus,                         unspecified                        D64.9, Anemia, unspecified CPT copyright 2019 American Medical Association. All rights reserved. The codes documented in this report are preliminary and upon coder review may  be revised to meet current compliance requirements. Attending  Participation:      I personally performed the entire procedure. Volney American, DO Annamaria Helling DO, DO 12/08/2021 12:09:25 PM This report has been signed electronically. Number of Addenda: 0 Note Initiated On: 12/08/2021 10:31 AM Estimated Blood Loss:  Estimated blood loss was minimal.      [facility]

## 2021-12-08 NOTE — Anesthesia Procedure Notes (Signed)
Procedure Name: General with mask airway Date/Time: 12/08/2021 10:51 AM Performed by: Kelton Pillar, CRNA Pre-anesthesia Checklist: Patient identified, Emergency Drugs available, Suction available and Patient being monitored Patient Re-evaluated:Patient Re-evaluated prior to induction Oxygen Delivery Method: Simple face mask Induction Type: IV induction Placement Confirmation: positive ETCO2 and CO2 detector Dental Injury: Teeth and Oropharynx as per pre-operative assessment

## 2021-12-08 NOTE — Progress Notes (Signed)
Inpatient Follow-up/Progress Note   Patient ID: Diane Adkins is a 76 y.o. female.  Overnight Events / Subjective Findings NAEON. Completed colonoscopy prep with see through material in toilet. No melena or hematochezia. Plan for EGD and colonoscopy today. Off of supplemental o2, denies sob. Hgb continues to uptrend. NPO since midnight  No n/v/abdominal pain. No other acute gi complaints.  Review of Systems  Constitutional:  Positive for appetite change and unexpected weight change. Negative for activity change, chills, diaphoresis, fatigue and fever.  HENT:  Negative for trouble swallowing and voice change.   Respiratory:  Negative for shortness of breath. Negative for wheezing.   Cardiovascular:  Negative for chest pain, palpitations and leg swelling.  Gastrointestinal:  Negative for abdominal distention, abdominal pain, anal bleeding, blood in stool, constipation, diarrhea, nausea, rectal pain and vomiting.  Musculoskeletal:  Positive for arthralgias, back pain and myalgias.  Skin:  Negative for color change and pallor.  Neurological:  Positive for weakness. Negative for dizziness and syncope.  Psychiatric/Behavioral:  Negative for agitation and confusion.   All other systems reviewed and are negative.   Medications  Current Facility-Administered Medications:    0.9 %  sodium chloride infusion, , Intravenous, Continuous, Annamaria Helling, DO, Last Rate: 20 mL/hr at 12/08/21 0844, New Bag at 12/08/21 0844   acetaminophen (TYLENOL) tablet 650 mg, 650 mg, Oral, Q6H PRN **OR** acetaminophen (TYLENOL) suppository 650 mg, 650 mg, Rectal, Q6H PRN, Athena Masse, MD   albuterol (PROVENTIL) (2.5 MG/3ML) 0.083% nebulizer solution 2.5 mg, 2.5 mg, Nebulization, Q2H PRN, Athena Masse, MD   amoxicillin-clavulanate (AUGMENTIN) 875-125 MG per tablet 1 tablet, 1 tablet, Oral, Q12H, Wieting, Richard, MD, 1 tablet at 12/08/21 0848   [START ON 12/09/2021] feeding supplement (ENSURE ENLIVE /  ENSURE PLUS) liquid 237 mL, 237 mL, Oral, TID BM, Wieting, Richard, MD   hydroxychloroquine (PLAQUENIL) tablet 200 mg, 200 mg, Oral, Daily, Wieting, Richard, MD, 200 mg at 12/08/21 0846   ipratropium-albuterol (DUONEB) 0.5-2.5 (3) MG/3ML nebulizer solution 3 mL, 3 mL, Nebulization, TID, Wieting, Richard, MD, 3 mL at 12/08/21 0750   levETIRAcetam (KEPPRA) tablet 500 mg, 500 mg, Oral, BID, Wieting, Richard, MD, 500 mg at 12/08/21 0845   levothyroxine (SYNTHROID) tablet 88 mcg, 88 mcg, Oral, QHS, Wieting, Richard, MD, 88 mcg at 12/07/21 2043   methocarbamol (ROBAXIN) tablet 500 mg, 500 mg, Oral, BID PRN, Leslye Peer, Richard, MD   methylPREDNISolone sodium succinate (SOLU-MEDROL) 40 mg/mL injection 40 mg, 40 mg, Intravenous, Daily, Wieting, Richard, MD, 40 mg at 12/08/21 0933   metoprolol tartrate (LOPRESSOR) tablet 25 mg, 25 mg, Oral, BID, Wieting, Richard, MD, 25 mg at 12/08/21 0845   mirtazapine (REMERON) tablet 15 mg, 15 mg, Oral, QHS, Wieting, Richard, MD, 15 mg at 12/07/21 2041   multivitamin with minerals tablet 1 tablet, 1 tablet, Oral, Daily, Wieting, Richard, MD, 1 tablet at 12/08/21 0845   ondansetron (ZOFRAN) tablet 4 mg, 4 mg, Oral, Q6H PRN, 4 mg at 12/07/21 1446 **OR** ondansetron (ZOFRAN) injection 4 mg, 4 mg, Intravenous, Q6H PRN, Judd Gaudier V, MD, 4 mg at 12/08/21 7169   oxyCODONE (Oxy IR/ROXICODONE) immediate release tablet 15 mg, 15 mg, Oral, Q4H PRN, Leslye Peer, Richard, MD, 15 mg at 12/08/21 0845   pantoprazole (PROTONIX) EC tablet 40 mg, 40 mg, Oral, Daily, Wieting, Richard, MD, 40 mg at 12/08/21 0845   PARoxetine (PAXIL) tablet 10 mg, 10 mg, Oral, Daily, Leslye Peer, Richard, MD, 10 mg at 12/08/21 0846   polyethylene glycol (MIRALAX /  GLYCOLAX) packet 17 g, 17 g, Oral, Daily, Annamaria Helling, DO, 17 g at 12/07/21 9678   polyethylene glycol-electrolytes (NuLYTELY) solution 2,000 mL, 2,000 mL, Oral, Once PRN, Annamaria Helling, DO   pregabalin (LYRICA) capsule 50 mg, 50 mg, Oral,  BID, Leslye Peer, Richard, MD, 50 mg at 12/08/21 0845   senna-docusate (Senokot-S) tablet 2 tablet, 2 tablet, Oral, BID, Loletha Grayer, MD, 2 tablet at 12/07/21 2045   traZODone (DESYREL) tablet 50 mg, 50 mg, Oral, QHS PRN, Loletha Grayer, MD, 50 mg at 12/06/21 2004  sodium chloride 20 mL/hr at 12/08/21 0844    acetaminophen **OR** acetaminophen, albuterol, methocarbamol, ondansetron **OR** ondansetron (ZOFRAN) IV, oxyCODONE, polyethylene glycol-electrolytes, traZODone   Objective    Vitals:   12/07/21 2014 12/08/21 0600 12/08/21 0752 12/08/21 0827  BP:  (!) 144/70  125/67  Pulse:  61  86  Resp:  18  18  Temp:  98 F (36.7 C)  98.1 F (36.7 C)  TempSrc:  Oral    SpO2: 94% 91% 91% 95%  Weight:      Height:         Physical Exam Vitals and nursing note reviewed.  Constitutional:      General: She is not in acute distress.    Appearance: Normal appearance. She is ill-appearing (chronically). She is not toxic-appearing or diaphoretic.     Comments: Thin, frail appearing  HENT:     Head: Normocephalic and atraumatic.     Ears:     Comments: HOH    Nose: Nose normal.     Mouth/Throat:     Mouth: Mucous membranes are moist.     Pharynx: Oropharynx is clear.  Eyes:     General: No scleral icterus.    Extraocular Movements: Extraocular movements intact.  Cardiovascular:     Rate and Rhythm: Normal rate and regular rhythm.     Heart sounds: Normal heart sounds. No murmur heard.   No friction rub. No gallop.  Pulmonary:     Effort: No respiratory distress.     Breath sounds: No wheezing, rhonchi or rales.     Comments: Diminished bilaterally Abdominal:     General: Abdomen is flat. Bowel sounds are normal. There is no distension.     Palpations: Abdomen is soft.     Tenderness: There is no abdominal tenderness. There is no guarding or rebound.  Musculoskeletal:     Cervical back: Neck supple.     Right lower leg: No edema.     Left lower leg: No edema.  Skin:     General: Skin is warm and dry.     Coloration: Skin is not jaundiced or pale.  Neurological:     General: No focal deficit present.     Mental Status: She is alert and oriented to person, place, and time. Mental status is at baseline.  Psychiatric:        Mood and Affect: Mood normal.        Behavior: Behavior normal.        Thought Content: Thought content normal.        Judgment: Judgment normal.   Laboratory Data Recent Labs  Lab 12/05/21 0400 12/06/21 0517 12/07/21 0434 12/08/21 0446  WBC 8.2 6.7 8.6 8.3  HGB 7.9* 7.9* 10.4* 11.0*  HCT 25.4* 26.1* 32.5* 34.8*  PLT 668* 714* 794* 793*  NEUTOPHILPCT 61  --   --   --   LYMPHOPCT 11  --   --   --  MONOPCT 27  --   --   --   EOSPCT 0  --   --   --     Recent Labs  Lab 12/04/21 0436 12/06/21 0517 12/08/21 0446  NA 140 144 142  K 3.6 4.1 4.1  CL 104 106 102  CO2 27 32 32  BUN 18 25* 23  CREATININE 0.90 1.04* 0.95  CALCIUM 8.7* 8.3* 8.9  PROT  --  5.6*  --   BILITOT  --  0.3  --   ALKPHOS  --  48  --   ALT  --  30  --   AST  --  38  --   GLUCOSE 209* 98 86    No results for input(s): INR in the last 168 hours.    Imaging Studies: No results found.  Assessment:   # Anemia - unclear origin- mixed picture- suspect malnutrition contributing  - may be related to MTX use, and now with inflammation ferritin and plt up - iron sat is low at 7% - hgb dramatically down from last month at 13.1 to 7.9 on presentation- despite this no signs of GIB - rcd 1 u prbc 5/28 with improvement - colonoscopy 03/2014 with 1 TA; 2004 csy with polyps   # abnormal weight loss- reports swing from 110 --> 86 --> 96   # acute hypoxic respiratory failure 2/2 COPD exacerbation- off oxygen saturating 94% on RA   # dental abscess on abx   # chronic pain with long term opioid use # htn, hypothyroidism, seizures, PCM  Plan:  EGD and Colonoscopy today Morning labs reviewed Completed bowel prep Hgb continues to uptrend Monitor H&H.   Transfusion and resuscitation as per primary team Avoid frequent lab draws to prevent lab induced anemia Supportive care and antiemetics as per primary team Maintain two sites IV access Avoid nsaids Hol ddvt ppx On ppi once daily po Monitor for GIB.  Esophagogastroduodenoscopy and colonoscopy with possible biopsy, control of bleeding, polypectomy, and interventions as necessary has been discussed with the patient/patient representative. Informed consent was obtained from the patient/patient representative after explaining the indication, nature, and risks of the procedure including but not limited to death, bleeding, perforation, missed neoplasm/lesions, cardiorespiratory compromise, and reaction to medications. Opportunity for questions was given and appropriate answers were provided. Patient/patient representative has verbalized understanding is amenable to undergoing the procedure.  D/w nursing and hospitalist  I personally performed the service.  Management of other medical comorbidities as per primary team  Thank you for allowing [place] to participate in this patient's care. Please don't hesitate to call if any questions or concerns arise.   Annamaria Helling, DO [facility]  Portions of the record may have been created with voice recognition software. Occasional wrong-word or 'sound-a-like' substitutions may have occurred due to the inherent limitations of voice recognition software.  Read the chart carefully and recognize, using context, where substitutions may have occurred.

## 2021-12-08 NOTE — Progress Notes (Signed)
PT Cancellation Note  Patient Details Name: Diane Adkins MRN: [id number] DOB: 14-May-1946   Cancelled Treatment:    Reason Eval/Treat Not Completed: Patient at procedure or test/unavailable (Pt off floor for endo procedure. Will resume PT services at later date/time.)    Eilah Common C PT, DPT  12/08/2021, 1:10 PM
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
## 2021-12-08 NOTE — Discharge Instructions (Signed)
DO Not Take methotrexate  Can consider CT colonoscopy as outpatient

## 2021-12-08 NOTE — H&P (View-Only) (Signed)
Inpatient Follow-up/Progress Note   Patient ID: Diane Adkins is a 76 y.o. female.  Overnight Events / Subjective Findings NAEON. Completed colonoscopy prep with see through material in toilet. No melena or hematochezia. Plan for EGD and colonoscopy today. Off of supplemental o2, denies sob. Hgb continues to uptrend. NPO since midnight  No n/v/abdominal pain. No other acute gi complaints.  Review of Systems  Constitutional:  Positive for appetite change and unexpected weight change. Negative for activity change, chills, diaphoresis, fatigue and fever.  HENT:  Negative for trouble swallowing and voice change.   Respiratory:  Negative for shortness of breath. Negative for wheezing.   Cardiovascular:  Negative for chest pain, palpitations and leg swelling.  Gastrointestinal:  Negative for abdominal distention, abdominal pain, anal bleeding, blood in stool, constipation, diarrhea, nausea, rectal pain and vomiting.  Musculoskeletal:  Positive for arthralgias, back pain and myalgias.  Skin:  Negative for color change and pallor.  Neurological:  Positive for weakness. Negative for dizziness and syncope.  Psychiatric/Behavioral:  Negative for agitation and confusion.   All other systems reviewed and are negative.   Medications  Current Facility-Administered Medications:    0.9 %  sodium chloride infusion, , Intravenous, Continuous, Annamaria Helling, DO, Last Rate: 20 mL/hr at 12/08/21 0844, New Bag at 12/08/21 0844   acetaminophen (TYLENOL) tablet 650 mg, 650 mg, Oral, Q6H PRN **OR** acetaminophen (TYLENOL) suppository 650 mg, 650 mg, Rectal, Q6H PRN, Athena Masse, MD   albuterol (PROVENTIL) (2.5 MG/3ML) 0.083% nebulizer solution 2.5 mg, 2.5 mg, Nebulization, Q2H PRN, Athena Masse, MD   amoxicillin-clavulanate (AUGMENTIN) 875-125 MG per tablet 1 tablet, 1 tablet, Oral, Q12H, Wieting, Richard, MD, 1 tablet at 12/08/21 0848   [START ON 12/09/2021] feeding supplement (ENSURE ENLIVE /  ENSURE PLUS) liquid 237 mL, 237 mL, Oral, TID BM, Wieting, Richard, MD   hydroxychloroquine (PLAQUENIL) tablet 200 mg, 200 mg, Oral, Daily, Wieting, Richard, MD, 200 mg at 12/08/21 0846   ipratropium-albuterol (DUONEB) 0.5-2.5 (3) MG/3ML nebulizer solution 3 mL, 3 mL, Nebulization, TID, Wieting, Richard, MD, 3 mL at 12/08/21 0750   levETIRAcetam (KEPPRA) tablet 500 mg, 500 mg, Oral, BID, Wieting, Richard, MD, 500 mg at 12/08/21 0845   levothyroxine (SYNTHROID) tablet 88 mcg, 88 mcg, Oral, QHS, Wieting, Richard, MD, 88 mcg at 12/07/21 2043   methocarbamol (ROBAXIN) tablet 500 mg, 500 mg, Oral, BID PRN, Leslye Peer, Richard, MD   methylPREDNISolone sodium succinate (SOLU-MEDROL) 40 mg/mL injection 40 mg, 40 mg, Intravenous, Daily, Wieting, Richard, MD, 40 mg at 12/08/21 0933   metoprolol tartrate (LOPRESSOR) tablet 25 mg, 25 mg, Oral, BID, Wieting, Richard, MD, 25 mg at 12/08/21 0845   mirtazapine (REMERON) tablet 15 mg, 15 mg, Oral, QHS, Wieting, Richard, MD, 15 mg at 12/07/21 2041   multivitamin with minerals tablet 1 tablet, 1 tablet, Oral, Daily, Wieting, Richard, MD, 1 tablet at 12/08/21 0845   ondansetron (ZOFRAN) tablet 4 mg, 4 mg, Oral, Q6H PRN, 4 mg at 12/07/21 1446 **OR** ondansetron (ZOFRAN) injection 4 mg, 4 mg, Intravenous, Q6H PRN, Judd Gaudier V, MD, 4 mg at 12/08/21 3762   oxyCODONE (Oxy IR/ROXICODONE) immediate release tablet 15 mg, 15 mg, Oral, Q4H PRN, Leslye Peer, Richard, MD, 15 mg at 12/08/21 0845   pantoprazole (PROTONIX) EC tablet 40 mg, 40 mg, Oral, Daily, Wieting, Richard, MD, 40 mg at 12/08/21 0845   PARoxetine (PAXIL) tablet 10 mg, 10 mg, Oral, Daily, Leslye Peer, Richard, MD, 10 mg at 12/08/21 0846   polyethylene glycol (MIRALAX /  GLYCOLAX) packet 17 g, 17 g, Oral, Daily, Annamaria Helling, DO, 17 g at 12/07/21 7619   polyethylene glycol-electrolytes (NuLYTELY) solution 2,000 mL, 2,000 mL, Oral, Once PRN, Annamaria Helling, DO   pregabalin (LYRICA) capsule 50 mg, 50 mg, Oral,  BID, Leslye Peer, Richard, MD, 50 mg at 12/08/21 0845   senna-docusate (Senokot-S) tablet 2 tablet, 2 tablet, Oral, BID, Loletha Grayer, MD, 2 tablet at 12/07/21 2045   traZODone (DESYREL) tablet 50 mg, 50 mg, Oral, QHS PRN, Loletha Grayer, MD, 50 mg at 12/06/21 2004  sodium chloride 20 mL/hr at 12/08/21 0844    acetaminophen **OR** acetaminophen, albuterol, methocarbamol, ondansetron **OR** ondansetron (ZOFRAN) IV, oxyCODONE, polyethylene glycol-electrolytes, traZODone   Objective    Vitals:   12/07/21 2014 12/08/21 0600 12/08/21 0752 12/08/21 0827  BP:  (!) 144/70  125/67  Pulse:  61  86  Resp:  18  18  Temp:  98 F (36.7 C)  98.1 F (36.7 C)  TempSrc:  Oral    SpO2: 94% 91% 91% 95%  Weight:      Height:         Physical Exam Vitals and nursing note reviewed.  Constitutional:      General: She is not in acute distress.    Appearance: Normal appearance. She is ill-appearing (chronically). She is not toxic-appearing or diaphoretic.     Comments: Thin, frail appearing  HENT:     Head: Normocephalic and atraumatic.     Ears:     Comments: HOH    Nose: Nose normal.     Mouth/Throat:     Mouth: Mucous membranes are moist.     Pharynx: Oropharynx is clear.  Eyes:     General: No scleral icterus.    Extraocular Movements: Extraocular movements intact.  Cardiovascular:     Rate and Rhythm: Normal rate and regular rhythm.     Heart sounds: Normal heart sounds. No murmur heard.   No friction rub. No gallop.  Pulmonary:     Effort: No respiratory distress.     Breath sounds: No wheezing, rhonchi or rales.     Comments: Diminished bilaterally Abdominal:     General: Abdomen is flat. Bowel sounds are normal. There is no distension.     Palpations: Abdomen is soft.     Tenderness: There is no abdominal tenderness. There is no guarding or rebound.  Musculoskeletal:     Cervical back: Neck supple.     Right lower leg: No edema.     Left lower leg: No edema.  Skin:     General: Skin is warm and dry.     Coloration: Skin is not jaundiced or pale.  Neurological:     General: No focal deficit present.     Mental Status: She is alert and oriented to person, place, and time. Mental status is at baseline.  Psychiatric:        Mood and Affect: Mood normal.        Behavior: Behavior normal.        Thought Content: Thought content normal.        Judgment: Judgment normal.   Laboratory Data Recent Labs  Lab 12/05/21 0400 12/06/21 0517 12/07/21 0434 12/08/21 0446  WBC 8.2 6.7 8.6 8.3  HGB 7.9* 7.9* 10.4* 11.0*  HCT 25.4* 26.1* 32.5* 34.8*  PLT 668* 714* 794* 793*  NEUTOPHILPCT 61  --   --   --   LYMPHOPCT 11  --   --   --  MONOPCT 27  --   --   --   EOSPCT 0  --   --   --     Recent Labs  Lab 12/04/21 0436 12/06/21 0517 12/08/21 0446  NA 140 144 142  K 3.6 4.1 4.1  CL 104 106 102  CO2 27 32 32  BUN 18 25* 23  CREATININE 0.90 1.04* 0.95  CALCIUM 8.7* 8.3* 8.9  PROT  --  5.6*  --   BILITOT  --  0.3  --   ALKPHOS  --  48  --   ALT  --  30  --   AST  --  38  --   GLUCOSE 209* 98 86    No results for input(s): INR in the last 168 hours.    Imaging Studies: No results found.  Assessment:   # Anemia - unclear origin- mixed picture- suspect malnutrition contributing  - may be related to MTX use, and now with inflammation ferritin and plt up - iron sat is low at 7% - hgb dramatically down from last month at 13.1 to 7.9 on presentation- despite this no signs of GIB - rcd 1 u prbc 5/28 with improvement - colonoscopy 03/2014 with 1 TA; 2004 csy with polyps   # abnormal weight loss- reports swing from 110 --> 86 --> 96   # acute hypoxic respiratory failure 2/2 COPD exacerbation- off oxygen saturating 94% on RA   # dental abscess on abx   # chronic pain with long term opioid use # htn, hypothyroidism, seizures, PCM  Plan:  EGD and Colonoscopy today Morning labs reviewed Completed bowel prep Hgb continues to uptrend Monitor H&H.   Transfusion and resuscitation as per primary team Avoid frequent lab draws to prevent lab induced anemia Supportive care and antiemetics as per primary team Maintain two sites IV access Avoid nsaids Hol ddvt ppx On ppi once daily po Monitor for GIB.  Esophagogastroduodenoscopy and colonoscopy with possible biopsy, control of bleeding, polypectomy, and interventions as necessary has been discussed with the patient/patient representative. Informed consent was obtained from the patient/patient representative after explaining the indication, nature, and risks of the procedure including but not limited to death, bleeding, perforation, missed neoplasm/lesions, cardiorespiratory compromise, and reaction to medications. Opportunity for questions was given and appropriate answers were provided. Patient/patient representative has verbalized understanding is amenable to undergoing the procedure.  D/w nursing and hospitalist  I personally performed the service.  Management of other medical comorbidities as per primary team  Thank you for allowing [place] to participate in this patient's care. Please don't hesitate to call if any questions or concerns arise.   Annamaria Helling, DO [facility]  Portions of the record may have been created with voice recognition software. Occasional wrong-word or 'sound-a-like' substitutions may have occurred due to the inherent limitations of voice recognition software.  Read the chart carefully and recognize, using context, where substitutions may have occurred.

## 2021-12-08 NOTE — Anesthesia Preprocedure Evaluation (Signed)
Anesthesia Evaluation  Patient identified by MRN, date of birth, ID band Patient awake    Reviewed: Allergy & Precautions, NPO status , Patient's Chart, lab work & pertinent test results  History of Anesthesia Complications Negative for: history of anesthetic complications  Airway Mallampati: III  TM Distance: <3 FB Neck ROM: full    Dental  (+) Chipped, Poor Dentition   Pulmonary pneumonia, COPD, Current Smoker and Patient abstained from smoking.,    breath sounds clear to auscultation+ rhonchi        Cardiovascular hypertension, negative cardio ROS Normal cardiovascular exam     Neuro/Psych Seizures -,   Neuromuscular disease negative psych ROS   GI/Hepatic Neg liver ROS, GERD  Controlled,  Endo/Other  Hypothyroidism   Renal/GU Renal disease  negative genitourinary   Musculoskeletal   Abdominal   Peds  Hematology negative hematology ROS (+)   Anesthesia Other Findings Patient has medical optimization for procedure   Past Medical History: No date: Arthritis     Comment:  ra No date: Cardiomegaly No date: Cataract No date: COPD (chronic obstructive pulmonary disease) (HCC) No date: Coronary artery disease No date: Depression No date: Diverticulitis No date: Epilepsy ([place]) No date: Fibromyalgia No date: GERD (gastroesophageal reflux disease) No date: HOH (hard of hearing)     Comment:  aids No date: Hypertension No date: Hypothyroidism No date: Inflammatory bowel disease No date: Lupus ([place]) 02/17/2016: 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[place]) 1990's: MRSA (methicillin resistant staph aureus) culture positive No date: Opioid dependence ([place]) No date: Pain     Comment:  chronic back and neck No date: Seizures ([place])     Comment:  last 2016 No date: Spinal disorder     Comment:  stenosis No date: Thyroid disease     Comment:  hypothyroid  Past Surgical History: No date: ABDOMINAL HYSTERECTOMY No date: BACK  SURGERY     Comment:  [age]   cervical fusion/thoracic/lumbar with ray cages No date: BREAST SURGERY     Comment:  implants 2013: cardiac stents     Comment:  x 2 03/30/2016: CATARACT EXTRACTION W/PHACO; Left     Comment:  Procedure: CATARACT EXTRACTION PHACO AND INTRAOCULAR               LENS PLACEMENT (IOC);  Surgeon: Birder Robson, MD;                Location: [facility] ORS;  Service: Ophthalmology;  Laterality:              Left;  [place] 00:45 AP% 19.6 CDE 8.93 Fluid pack lot #               [id number] 04/27/2016: CATARACT EXTRACTION W/PHACO; Right     Comment:  Procedure: CATARACT EXTRACTION PHACO AND INTRAOCULAR               LENS PLACEMENT (IOC);  Surgeon: Birder Robson, MD;                Location: [facility] ORS;  Service: Ophthalmology;  Laterality:              Right;  Lot #[id number] H [place]: 00:39.1 AP%: 17.3 CDE: 6.76 No date: CERVICAL SPINE SURGERY No date: CORONARY ANGIOPLASTY     Comment:  stent x2 No date: EYE SURGERY 2007: JOINT REPLACEMENT; Left     Comment:  hip No date: rotator cuff surg; Right 01/24/2020: TOTAL HIP REVISION; Left     Comment:  Procedure: TOTAL HIP REVISION;  Surgeon: Kurtis Bushman  R, MD;  Location: [facility] ORS;  Service: Orthopedics;                Laterality: Left;  BMI    Body Mass Index: 18.02 kg/m      Reproductive/Obstetrics negative OB ROS                             Anesthesia Physical Anesthesia Plan  ASA: 3  Anesthesia Plan: General   Post-op Pain Management:    Induction: Intravenous  PONV Risk Score and Plan: Propofol infusion and TIVA  Airway Management Planned: Natural Airway and Nasal Cannula  Additional Equipment:   Intra-op Plan:   Post-operative Plan:   Informed Consent: I have reviewed the patients History and Physical, chart, labs and discussed the procedure including the risks, benefits and alternatives for the proposed anesthesia with the patient or authorized representative who  has indicated his/her understanding and acceptance.     Dental Advisory Given  Plan Discussed with: Anesthesiologist, CRNA and Surgeon  Anesthesia Plan Comments: (Patient consented for risks of anesthesia including but not limited to:  - adverse reactions to medications - risk of airway placement if required - damage to eyes, teeth, lips or other oral mucosa - nerve damage due to positioning  - sore throat or hoarseness - Damage to heart, brain, nerves, lungs, other parts of body or loss of life  Patient voiced understanding.)        Anesthesia Quick Evaluation

## 2021-12-08 NOTE — Discharge Summary (Addendum)
Physician Discharge Summary   Patient: Diane Adkins MRN: [id number] DOB: 03/22/1946  Admit date:     12/03/2021  Discharge date: 12/08/21  Discharge Physician: Loletha Grayer   PCP: Leonel Ramsay, MD   Recommendations at discharge:   Follow-up PCP 5 days Follow-up with your rheumatologist  Discharge Diagnoses: Principal Problem:   COPD exacerbation ([place]) Active Problems:   Dental abscess   Acute respiratory failure with hypoxia (HCC)   Anemia   Chronic pain syndrome   Thrombocytosis   Long term current use of opiate analgesic   Failed back surgical syndrome (x7)   Seizures (HCC)   Hypothyroidism   Hypertension   CAD (coronary artery disease)   Protein-calorie malnutrition, moderate (HCC)   Protein-calorie malnutrition, severe    Hospital Course: Patient was admitted to the hospital on 12/04/2021 with COPD exacerbation.  Patient was started on IV Solu-Medrol and nebulizer treatments.  The patient's respiratory status improved during the hospital course and able to come off oxygen.  Patient will be discharged home on a prednisone taper down to her chronic prednisone dose.  The patient also had a drop in hemoglobin down to hemoglobin of 7.9.  The patient was transfused 1 unit of packed red blood cells.  Hemoglobin upon discharge up to 11.  The patient had a EGD and colonoscopy on 12/08/2021.  Endoscopy consistent with gastritis.  Colonoscopy could not be completed secondary to the patient's kyphosis and positioning.  No bleeding seen and what was visualized.  Nonbleeding internal hemorrhoids.  I think that the drop in counts were secondary to methotrexate because the patient had pancytopenia prior to coming into the hospital.  Since I started folic acid the platelet count continue to rise.  Upon discharge white blood cell count 8.3, platelet count 79 3 and hemoglobin 11.0.  Assessment and Plan: * COPD exacerbation ([place]) Continue Solu-Medrol IV daily prior to EGD and  colonoscopy tomorrow..  Continue nebulizer treatments.  Patient will be discharged home on a prednisone taper down to her usual 2.5 mg daily.  Dental abscess Switch antibiotics to p.o. Augmentin for few days.  Will need dentist as outpatient.  Acute respiratory failure with hypoxia (HCC) O2 sats initially 88% on room air.  Patient was back on oxygen this morning for some reason.  Patient able to come off oxygen with good saturations.  Anemia Hemoglobin dropped down to 7.9.  Was 13.1 as outpatient last month.  Ferritin up at 220 goes along with anemia of chronic disease.  Initially had pancytopenia as outpatient which could be methotrexate toxicity.  I started oral folic acid which could increase the platelet count.  Hemoglobin responded to blood transfusion up to 11.0 upon discharge.  Thrombocytosis Likely reactive.  Platelet count was low as outpatient at 48.  Patient stopped methotrexate.  I did start folic acid which also can raise the count.  Platelet count up at 793.  I will stop folic acid at this point.  Chronic pain syndrome Continue chronic pain medication oxycodone.  Now patient stating that she is not can to leave without her pain medication.  I prescribed 8 doses of her pain medication to into her pharmacy and I spoke with the pharmacist to fill the 8 doses.  The patient told the nursing staff that she lost it in transit to the hospital.  I asked the nursing staff to call the our pharmacy to see if they have her medication and they do not have it.  Failed back surgical syndrome (  x7) On chronic oxycodone '15mg'$  qid. Followed by [facility] Continue  Lyrica and Robaxin   Protein-calorie malnutrition, moderate (HCC) Patient with BMI of 18.02   CAD (coronary artery disease) Continue aspirin and metoprolol.  Hypertension Continue metoprolol.  Hypothyroidism Continue levothyroxine 75 mg daily  Seizures (HCC) Continue Keppra 500 twice daily         Consultants:  Gastroenterology Procedures performed: EGD and colonoscopy Disposition: Home health Diet recommendation:  Regular diet DISCHARGE MEDICATION: Allergies as of 12/08/2021       Reactions   Keflex [cephalexin] Other (See Comments), Hives   Reaction: unknown   Beef-derived Products    malaise   Citrus    Orange juice/vomiting   Lamotrigine Itching, Swelling   Macrodantin [nitrofurantoin Macrocrystal] Other (See Comments)   Reaction: unknown   Nitrofurantoin Hives   Statins Hives   Fosamax [alendronate Sodium] Hives        Medication List     STOP taking these medications    folic acid 1 MG tablet Commonly known as: FOLVITE       TAKE these medications    albuterol 108 (90 Base) MCG/ACT inhaler Commonly known as: VENTOLIN HFA Inhale 2 puffs into the lungs every 4 (four) hours as needed for wheezing or shortness of breath.   amoxicillin-clavulanate 875-125 MG tablet Commonly known as: AUGMENTIN Take 1 tablet by mouth every 12 (twelve) hours for 6 days.   calcium carbonate 1250 MG capsule Take 1,250 mg by mouth daily.   feeding supplement Liqd Take 237 mLs by mouth 3 (three) times daily between meals. Start taking on: Dec 09, 2021   hydroxychloroquine 200 MG tablet Commonly known as: PLAQUENIL Take 200 mg by mouth daily.   levETIRAcetam 500 MG tablet Commonly known as: KEPPRA Take 500 mg by mouth 2 (two) times daily.   levothyroxine 88 MCG tablet Commonly known as: SYNTHROID Take 88 mcg by mouth at bedtime.   methocarbamol 500 MG tablet Commonly known as: ROBAXIN Take 500 mg by mouth in the morning and at bedtime.   metoprolol tartrate 25 MG tablet Commonly known as: LOPRESSOR Take 25 mg by mouth 2 (two) times daily.   mirtazapine 15 MG tablet Commonly known as: REMERON Take 15 mg by mouth at bedtime.   omeprazole 40 MG capsule Commonly known as: PRILOSEC Take 40 mg by mouth daily.   ondansetron 4 MG disintegrating tablet Commonly known as:  ZOFRAN-ODT Take 1 tablet (4 mg total) by mouth every 8 (eight) hours as needed for nausea or vomiting.   oxyCODONE 15 MG immediate release tablet Commonly known as: ROXICODONE Take 15 mg by mouth in the morning, at noon, in the evening, and at bedtime.   PARoxetine 10 MG tablet Commonly known as: PAXIL Take 10 mg by mouth daily.   predniSONE 5 MG tablet Commonly known as: DELTASONE Take 4 tabs po day1; 3 tabs po day2; 2 tabs po day3,4; 1 tab po day5,6; 1/2 tab po daily after What changed:  medication strength how much to take how to take this when to take this additional instructions   pregabalin 50 MG capsule Commonly known as: LYRICA Take 50 mg by mouth 2 (two) times daily.   senna-docusate 8.6-50 MG tablet Commonly known as: Senokot-S Take 2 tablets by mouth 2 (two) times daily.        Follow-up Information     Leonel Ramsay, MD. Go on 12/11/2021.   Specialty: Infectious Diseases Why: '@12'$ :00 Contact information: [address]  [place] [address] [phone]         Emmaline Kluver., MD. Go on 12/16/2021.   Specialty: Rheumatology Why: '@11'$ :30am Contact information: [place] [address] [phone]         Need to see your dentist Follow up in 1 week(s).                 Discharge Exam: Filed Weights   12/03/21 1721 12/03/21 2321 12/08/21 1023  Weight: 43.5 kg 44.7 kg 45.4 kg   Physical Exam HENT:     Head: Normocephalic.     Mouth/Throat:     Pharynx: No oropharyngeal exudate.  Eyes:     General: Lids are normal.     Conjunctiva/sclera: Conjunctivae normal.  Cardiovascular:     Rate and Rhythm: Normal rate and regular rhythm.     Heart sounds: Normal heart sounds, S1 normal and S2 normal.  Pulmonary:     Breath sounds: No decreased breath sounds, wheezing, rhonchi or rales.     Comments: Kyphoscoliosis. Abdominal:     Palpations: Abdomen is soft.      Tenderness: There is no abdominal tenderness.  Musculoskeletal:     Right lower leg: No swelling.     Left lower leg: No swelling.  Skin:    General: Skin is warm.     Findings: No rash.  Neurological:     Mental Status: She is alert and oriented to person, place, and time.     Condition at discharge: fair  The results of significant diagnostics from this hospitalization (including imaging, microbiology, ancillary and laboratory) are listed below for reference.   Imaging Studies: DG Chest 2 View  Result Date: 12/03/2021 CLINICAL DATA:  Shortness of breath, chest pain EXAM: CHEST - 2 VIEW COMPARISON:  CT chest dated 05/19/2021 FINDINGS: Lungs are clear.  No pleural effusion or pneumothorax. The heart is normal in size. Thoracic dextroscoliosis. Cervical spine fixation hardware. Lumbar spine fixation hardware. IMPRESSION: Normal chest radiographs. Electronically Signed   By: Julian Hy M.D.   On: 12/03/2021 18:04   CT Angio Chest PE W and/or Wo Contrast  Result Date: 12/03/2021 CLINICAL DATA:  Pulmonary embolism (PE) suspected, positive D-dimer. Dyspnea, hypoxia. EXAM: CT ANGIOGRAPHY CHEST WITH CONTRAST TECHNIQUE: Multidetector CT imaging of the chest was performed using the standard protocol during bolus administration of intravenous contrast. Multiplanar CT image reconstructions and MIPs were obtained to evaluate the vascular anatomy. RADIATION DOSE REDUCTION: This exam was performed according to the departmental dose-optimization program which includes automated exposure control, adjustment of the mA and/or kV according to patient size and/or use of iterative reconstruction technique. CONTRAST:  67m OMNIPAQUE IOHEXOL 350 MG/ML SOLN COMPARISON:  05/19/2021 FINDINGS: Cardiovascular: There is adequate opacification of the pulmonary arterial tree. No intraluminal filling defect identified to suggest acute pulmonary embolism. The central pulmonary arteries are mildly enlarged in keeping  with changes of pulmonary arterial hypertension. Extensive multi-vessel coronary artery calcification. Global cardiac size within normal limits. No pericardial effusion. Moderate atherosclerotic calcification within the thoracic aorta. No thoracic aortic aneurysm Mediastinum/Nodes: No enlarged mediastinal, hilar, or axillary lymph nodes. Thyroid gland, trachea, and esophagus demonstrate no significant findings. Lungs/Pleura: Severe emphysema with pulmonary hyperinflation. No focal pulmonary nodules or infiltrates. No pneumothorax or pleural effusion. Central airways are widely patent. Upper Abdomen: Extensive atheromatous plaquing mural thrombus is seen within visualized juxtarenal abdominal aorta, incompletely evaluated on this examination no acute abnormality. Musculoskeletal: Marked thoracolumbar dextro and kyphoscoliosis with  degenerative changes noted throughout the visualized thoracic spine. No acute bone abnormality. 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 Review of the MIP images confirms the above findings. IMPRESSION: No pulmonary embolism. Extensive multi-vessel coronary artery calcification. Severe emphysema. Aortic Atherosclerosis (ICD10-I70.0) and Emphysema (ICD10-J43.9). Electronically Signed   By: Fidela Salisbury M.D.   On: 12/03/2021 20:55   CT ABDOMEN PELVIS W CONTRAST  Result Date: 11/20/2021 CLINICAL DATA:  Nausea, vomiting, abdominal pain EXAM: CT ABDOMEN AND PELVIS WITH CONTRAST TECHNIQUE: Multidetector CT imaging of the abdomen and pelvis was performed using the standard protocol following bolus administration of intravenous contrast. RADIATION DOSE REDUCTION: This exam was performed according to the departmental dose-optimization program which includes automated exposure control, adjustment of the mA and/or kV according to patient size and/or use of iterative reconstruction technique. CONTRAST:  60m OMNIPAQUE IOHEXOL 300 MG/ML  SOLN  COMPARISON:  02/13/2019 FINDINGS: Lower chest: No acute pleural or parenchymal lung disease. Hepatobiliary: Stable appearance of the liver and gallbladder. No acute findings. No biliary duct dilation. Pancreas: Unremarkable. No pancreatic ductal dilatation or surrounding inflammatory changes. Spleen: Normal in size without focal abnormality. Adrenals/Urinary Tract: Mild left renal cortical thinning and scarring. Otherwise the kidneys are unremarkable without urinary tract calculi or obstructive uropathy. The adrenals are grossly unremarkable. The bladder is minimally distended. Evaluation is limited due to streak artifact from left hip arthroplasty. Stomach/Bowel: No bowel obstruction or ileus. Moderate retained stool within the rectal vault could reflect fecal impaction. No evidence of bowel wall thickening or inflammatory change. Evaluation of the bowel within the left hemipelvis is limited due to streak artifact. Vascular/Lymphatic: Stable 3.1 cm infrarenal abdominal aortic aneurysm with moderate atherosclerosis and mural thrombus. No pathologic adenopathy within the abdomen or pelvis. Reproductive: Status post hysterectomy. No adnexal masses. Other: No free fluid or free intraperitoneal gas. No abdominal wall hernia. Musculoskeletal: Stable left hip arthroplasty. Stable postsurgical changes from L3-4 discectomy infusion. Multilevel spondylosis and scoliosis throughout the visualized thoracolumbar spine. No acute fractures. Reconstructed images demonstrate no additional findings. IMPRESSION: 1. Moderate retained stool within the rectal vault which could reflect fecal impaction. No obstruction or ileus. 2. 3.1 cm infrarenal abdominal aortic aneurysm. Recommend follow-up every 3 years. Reference: J Am Coll Radiol 28309;40:768-088 3.  Aortic Atherosclerosis (ICD10-I70.0). Electronically Signed   By: MRanda NgoM.D.   On: 11/20/2021 19:09    Microbiology: Results for orders placed or performed during the  hospital encounter of 11/20/21  Group A Strep by PCR     Status: None   Collection Time: 11/20/21  5:33 PM   Specimen: Throat; Sterile Swab  Result Value Ref Range Status   Group A Strep by PCR NOT DETECTED NOT DETECTED Final    Comment: [facility] [address], [place] [id number] Resp Panel by RT-PCR (Flu A&B, Covid) Nasopharyngeal Swab     Status: None   Collection Time: 11/20/21  5:54 PM   Specimen: Nasopharyngeal Swab; Nasopharyngeal(NP) swabs in vial transport medium  Result Value Ref Range Status   SARS Coronavirus 2 by RT PCR NEGATIVE NEGATIVE Final    Comment: (NOTE) SARS-CoV-2 target nucleic acids are NOT DETECTED.  The SARS-CoV-2 RNA is generally detectable in upper respiratory specimens during the acute phase of infection. The lowest concentration of SARS-CoV-2 viral copies this assay can detect is 138 copies/mL. A negative result does not preclude SARS-Cov-2 infection and should not be used as the sole basis for treatment or other patient management  decisions. A negative result may occur with  improper specimen collection/handling, submission of specimen other than nasopharyngeal swab, presence of viral mutation(s) within the areas targeted by this assay, and inadequate number of viral copies(<138 copies/mL). A negative result must be combined with clinical observations, patient history, and epidemiological information. The expected result is Negative.  Fact Sheet for Patients:  EntrepreneurPulse.com.au  Fact Sheet for Healthcare Providers:  IncredibleEmployment.be  This test is no t yet approved or cleared by the [place] FDA and  has been authorized for detection and/or diagnosis of SARS-CoV-2 by FDA under an Emergency Use Authorization (EUA). This EUA will remain  in effect (meaning this test can be used) for the duration of the COVID-19 declaration under Section 564(b)(1) of the Act,  21 U.S.C.section 360bbb-3(b)(1), unless the authorization is terminated  or revoked sooner.       Influenza A by PCR NEGATIVE NEGATIVE Final   Influenza B by PCR NEGATIVE NEGATIVE Final    Comment: (NOTE) The Xpert Xpress SARS-CoV-2/FLU/RSV plus assay is intended as an aid in the diagnosis of influenza from Nasopharyngeal swab specimens and should not be used as a sole basis for treatment. Nasal washings and aspirates are unacceptable for Xpert Xpress SARS-CoV-2/FLU/RSV testing.  Fact Sheet for Patients: EntrepreneurPulse.com.au  Fact Sheet for Healthcare Providers: IncredibleEmployment.be  This test is not yet approved or cleared by the [place] FDA and has been authorized for detection and/or diagnosis of SARS-CoV-2 by FDA under an Emergency Use Authorization (EUA). This EUA will remain in effect (meaning this test can be used) for the duration of the COVID-19 declaration under Section 564(b)(1) of the Act, 21 U.S.C. section 360bbb-3(b)(1), unless the authorization is terminated or revoked.  Performed at [facility], [address] [place], [place] [address]     Labs: CBC: Recent Labs  Lab 12/04/21 [phone] 12/05/21 0400 12/06/21 0517 12/07/21 0434 12/08/21 0446  WBC 3.0* 8.2 6.7 8.6 8.3  NEUTROABS  --  5.1  --   --   --   HGB 8.6* 7.9* 7.9* 10.4* 11.0*  HCT 26.5* 25.4* 26.1* 32.5* 34.8*  MCV 91.7 93.7 96.0 91.5 92.6  PLT 614* 668* 714* 794* 409*   Basic Metabolic Panel: Recent Labs  Lab 12/03/21 1728 12/04/21 0436 12/06/21 0517 12/08/21 0446  NA 135 140 144 142  K 4.0 3.6 4.1 4.1  CL 100 104 106 102  CO2 27 27 32 32  GLUCOSE 104* 209* 98 86  BUN 15 18 25* 23  CREATININE 0.72 0.90 1.04* 0.95  CALCIUM 8.8* 8.7* 8.3* 8.9   Liver Function Tests: Recent Labs  Lab 12/06/21 0517  AST 38  ALT 30  ALKPHOS 48  BILITOT 0.3  PROT 5.6*  ALBUMIN 2.5*     Discharge time spent: greater than 30  minutes.  Signed: Loletha Grayer, MD [facility] 12/08/2021

## 2021-12-08 NOTE — Anesthesia Postprocedure Evaluation (Signed)
Anesthesia Post Note  Patient: CALIANNA KIM  Procedure(s) Performed: ESOPHAGOGASTRODUODENOSCOPY (EGD) WITH PROPOFOL COLONOSCOPY WITH PROPOFOL  Patient location during evaluation: Endoscopy Anesthesia Type: General Level of consciousness: awake and alert Pain management: pain level controlled Vital Signs Assessment: post-procedure vital signs reviewed and stable Respiratory status: spontaneous breathing, nonlabored ventilation, respiratory function stable and patient connected to nasal cannula oxygen Cardiovascular status: blood pressure returned to baseline and stable Postop Assessment: no apparent nausea or vomiting Anesthetic complications: no   No notable events documented.   Last Vitals:  Vitals:   12/08/21 1201 12/08/21 1211  BP: 128/87 (!) 148/83  Pulse: (!) 54 71  Resp: 14 19  Temp:    SpO2: 100% 97%    Last Pain:  Vitals:   12/08/21 1151  TempSrc: Temporal  PainSc: Asleep                 Precious Haws Andru Genter

## 2021-12-08 NOTE — Op Note (Signed)
[facility] Patient Name: Diane Adkins Procedure Date: 12/08/2021 10:31 AM MRN: [id number] Account #: [id number] Date of Birth: Nov 26, 1945 Admit Type: Outpatient Age: 76 Room: [facility] ENDO ROOM 3 Gender: Female Note Status: Finalized Instrument Name: Peds Colonoscope [id number] Procedure:             Colonoscopy Indications:           Anemia Providers:             Annamaria Helling DO, DO Medicines:             Monitored Anesthesia Care Complications:         No immediate complications. Estimated blood loss: None. Procedure:             Pre-Anesthesia Assessment:                        - Prior to the procedure, a History and Physical was                         performed, and patient medications and allergies were                         reviewed. The patient is competent. The risks and                         benefits of the procedure and the sedation options and                         risks were discussed with the patient. All questions                         were answered and informed consent was obtained.                         Patient identification and proposed procedure were                         verified by the physician, the nurse, the anesthetist                         and the technician in the endoscopy suite. Mental                         Status Examination: alert and oriented. Airway                         Examination: normal oropharyngeal airway and neck                         mobility. Respiratory Examination: poor air movement.                         CV Examination: RRR, no murmurs, no S3 or S4.                         Prophylactic Antibiotics: The patient does not require                         prophylactic antibiotics.  Prior Anticoagulants: The                         patient has taken no previous anticoagulant or                         antiplatelet agents. ASA Grade Assessment: III - A                         patient with  severe systemic disease. After reviewing                         the risks and benefits, the patient was deemed in                         satisfactory condition to undergo the procedure. The                         anesthesia plan was to use monitored anesthesia care                         (MAC). Immediately prior to administration of                         medications, the patient was re-assessed for adequacy                         to receive sedatives. The heart rate, respiratory                         rate, oxygen saturations, blood pressure, adequacy of                         pulmonary ventilation, and response to care were                         monitored throughout the procedure. The physical                         status of the patient was re-assessed after the                         procedure.                        After obtaining informed consent, the colonoscope was                         passed under direct vision. Throughout the procedure,                         the patient's blood pressure, pulse, and oxygen                         saturations were monitored continuously. The                         Colonoscope was introduced through the anus with the  intention of advancing to the cecum. The scope was                         advanced to the ascending colon before the procedure                         was aborted. Medications were given. The colonoscopy                         was technically difficult and complex due to                         restricted mobility of the colon, a redundant colon,                         significant looping and a tortuous colon. Successful                         completion of the procedure was aided by withdrawing                         the scope and replacing with the adult endoscope,                         straightening and shortening the scope to obtain bowel                         loop reduction, using  scope torsion, applying                         abdominal pressure and lavage. The patient tolerated                         the procedure well. The quality of the bowel                         preparation was excellent. Ascending colon and Rectum                         were photographed. Findings:      The perianal and digital rectal examinations were normal. Pertinent       negatives include normal sphincter tone.      Acutely angled sigmoid, fixed taht the pediatric colonoscope was not       able to traverse and was exchanged for a gastroscope. This was easily       able to be traversed with the gastroscope.      The colon was quite redundant and looping was occurring in the low       pelvis and unable to be adjusted with manual pressure. Due to the       patient's severe kyphosis, she was not able to be proned. Supine was       attempted, but not able to advance the scope beyond the proximal       ascending colon. Estimated blood loss: none.      Non-bleeding internal hemorrhoids were found during retroflexion. The       hemorrhoids were Grade I (internal hemorrhoids that do not prolapse).  Estimated blood loss: none.      Multiple hyperplastic appearing polyps in the rectum which were not       removed. Estimated blood loss: none.      The exam was otherwise without abnormality on direct and retroflexion       views.      no signs of fresh or old blood. Impression:            - Non-bleeding internal hemorrhoids.                        - The examination was otherwise normal on direct and                         retroflexion views.                        - No specimens collected. Recommendation:        - Return patient to hospital ward for ongoing care.                        - Resume regular diet.                        - Continue present medications.                        - Consider outpatient CT colonography to evaluate                         right colon                         - The findings and recommendations were discussed with                         the patient.                        - The findings and recommendations were discussed with                         the referring physician.                        - GI to sign off. Available as needed. Please call                         with any concerns. Procedure Code(s):     --- Professional ---                        [phone], 82, Colonoscopy, flexible; diagnostic,                         including collection of specimen(s) by brushing or                         washing, when performed (separate procedure) Diagnosis Code(s):     --- Professional ---                        K64.0, First degree  hemorrhoids                        D64.9, Anemia, unspecified CPT copyright 2019 [employer]. All rights reserved. The codes documented in this report are preliminary and upon coder review may  be revised to meet current compliance requirements. Attending Participation:      I personally performed the entire procedure. Volney American, DO Annamaria Helling DO, DO 12/08/2021 12:04:12 PM This report has been signed electronically. Number of Addenda: 0 Note Initiated On: 12/08/2021 10:31 AM Total Procedure Duration: 0 hours 48 minutes 59 seconds  Estimated Blood Loss:  Estimated blood loss: none.      [facility]

## 2021-12-09 ENCOUNTER — Encounter: Payer: Self-pay | Admitting: Gastroenterology

## 2021-12-09 LAB — SURGICAL PATHOLOGY

## 2021-12-10 LAB — HEAVY METALS, BLOOD
Arsenic: 1 ug/L (ref 0–9)
Lead: <1 ug/dL (ref 0.0–3.4)
Mercury: <1 ug/L (ref 0.0–14.9)

## 2021-12-16 ENCOUNTER — Other Ambulatory Visit: Payer: Self-pay | Admitting: Gastroenterology

## 2021-12-16 DIAGNOSIS — D649 Anemia, unspecified: Secondary | ICD-10-CM

## 2022-02-09 DEATH — deceased
